# Patient Record
Sex: Male | Born: 1945 | Race: White | Hispanic: No | Marital: Married | Smoking: Never smoker
Health system: Southern US, Community
[De-identification: ages and names within clinical notes are randomized; demographics above are authoritative.]

## PROBLEM LIST (undated history)

## (undated) DIAGNOSIS — E039 Hypothyroidism, unspecified: Secondary | ICD-10-CM

## (undated) DIAGNOSIS — K760 Fatty (change of) liver, not elsewhere classified: Secondary | ICD-10-CM

## (undated) DIAGNOSIS — E782 Mixed hyperlipidemia: Secondary | ICD-10-CM

## (undated) DIAGNOSIS — K2270 Barrett's esophagus without dysplasia: Secondary | ICD-10-CM

## (undated) DIAGNOSIS — M1612 Unilateral primary osteoarthritis, left hip: Secondary | ICD-10-CM

## (undated) DIAGNOSIS — E038 Other specified hypothyroidism: Secondary | ICD-10-CM

## (undated) DIAGNOSIS — Z86010 Personal history of colonic polyps: Secondary | ICD-10-CM

## (undated) DIAGNOSIS — I10 Essential (primary) hypertension: Secondary | ICD-10-CM

## (undated) DIAGNOSIS — H269 Unspecified cataract: Secondary | ICD-10-CM

## (undated) DIAGNOSIS — R338 Other retention of urine: Secondary | ICD-10-CM

## (undated) DIAGNOSIS — D696 Thrombocytopenia, unspecified: Secondary | ICD-10-CM

## (undated) DIAGNOSIS — M47816 Spondylosis without myelopathy or radiculopathy, lumbar region: Secondary | ICD-10-CM

## (undated) DIAGNOSIS — K219 Gastro-esophageal reflux disease without esophagitis: Secondary | ICD-10-CM

## (undated) DIAGNOSIS — N182 Chronic kidney disease, stage 2 (mild): Secondary | ICD-10-CM

## (undated) HISTORY — DX: Unspecified cataract: H26.9

## (undated) HISTORY — PX: TUBAL LIGATION: SHX77

## (undated) HISTORY — DX: Other specified hypothyroidism: E03.8

## (undated) HISTORY — DX: Hypothyroidism, unspecified: E03.9

## (undated) HISTORY — DX: Thrombocytopenia, unspecified: D69.6

## (undated) HISTORY — DX: Unilateral primary osteoarthritis, left hip: M16.12

## (undated) HISTORY — DX: Fatty (change of) liver, not elsewhere classified: K76.0

## (undated) HISTORY — DX: Chronic kidney disease, stage 2 (mild): N18.2

## (undated) HISTORY — PX: ESOPHAGOGASTRODUODENOSCOPY: SHX1529

## (undated) HISTORY — DX: Spondylosis without myelopathy or radiculopathy, lumbar region: M47.816

## (undated) HISTORY — DX: Mixed hyperlipidemia: E78.2

## (undated) HISTORY — PX: APPENDECTOMY: SHX54

## (undated) HISTORY — DX: Barrett's esophagus without dysplasia: K22.70

## (undated) HISTORY — DX: Gastro-esophageal reflux disease without esophagitis: K21.9

## (undated) HISTORY — DX: Personal history of colonic polyps: Z86.010

---
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
## 1968-04-13 HISTORY — PX: APPENDECTOMY: SHX54

## 2007-09-12 DIAGNOSIS — Z86010 Personal history of colonic polyps: Secondary | ICD-10-CM

## 2007-09-12 DIAGNOSIS — Z860101 Personal history of adenomatous and serrated colon polyps: Secondary | ICD-10-CM

## 2007-09-12 HISTORY — DX: Personal history of adenomatous and serrated colon polyps: Z86.0101

## 2007-09-12 HISTORY — DX: Personal history of colonic polyps: Z86.010

## 2007-09-26 ENCOUNTER — Ambulatory Visit (HOSPITAL_COMMUNITY): Admission: RE | Admit: 2007-09-26 | Discharge: 2007-09-26 | Payer: Self-pay | Admitting: Gastroenterology

## 2007-09-26 ENCOUNTER — Encounter (INDEPENDENT_AMBULATORY_CARE_PROVIDER_SITE_OTHER): Payer: Self-pay | Admitting: Gastroenterology

## 2008-05-25 ENCOUNTER — Emergency Department (HOSPITAL_COMMUNITY): Admission: EM | Admit: 2008-05-25 | Discharge: 2008-05-25 | Payer: Self-pay | Admitting: Emergency Medicine

## 2010-08-26 NOTE — Op Note (Signed)
NAMEFERLANDO, Shawn [name]                 ACCOUNT NO.:  [id number]   MEDICAL RECORD NO.:  [id number]          PATIENT TYPE:  AMB   LOCATION:  ENDO                         FACILITY:  [facility]   PHYSICIAN:  Anselmo Rod, M.D.  DATE OF BIRTH:  [birth year]   DATE OF PROCEDURE:  09/26/2007  DATE OF DISCHARGE:                               OPERATIVE REPORT   PROCEDURE PERFORMED:  Colonoscopy with cold biopsies x 2.   ENDOSCOPIST:  Anselmo Rod.   INSTRUMENT USED:  Pentax video colonoscope.   INDICATIONS FOR PROCEDURE:  A 65 year old white male undergoing  screening colonoscopy to rule out colonic polyps, masses, etc.   PREPROCEDURE PREPARATION:  Informed consent was procured from the  patient.  The patient fasted for 4 hours prior to the procedure after  being prepped with 20 OsmoPrep the night of and 12 OsmoPrep pills the  morning of the procedure.  The risks and benefits of the procedure  including a 10% miss rate of cancer and polyp were discussed with the  patient as well.   PREPROCEDURE PHYSICAL:  The patient had stable vital signs.  NECK:  Supple.  CHEST:  Clear to auscultation.  S1/S2 regular.  ABDOMEN:  Soft with normal bowel sounds.   DESCRIPTION OF PROCEDURE:  The patient was placed in the left lateral  decubitus position and sedated with 75 mcg of Fentanyl and 8 mg of  Versed given intravenously in slow incremental doses. Once the patient  was adequately sedated and maintained on low-flow oxygen and continuous  cardiac monitoring, the Pentax video colonoscope was advanced from the  rectum to the cecum.  There was a large amount of residual stool in the  colon.  Multiple washes were done.  Small lesions could be missed.  Two  small sessile polyps were biopsied from the transverse colon.  Small  internal hemorrhoids were seen on retroflexion.  There was no evidence  of diverticulosis.  The appendiceal orifice and ileocecal valve were  clearly visualized and photographed.  The  patient tolerated the  procedure well without immediate complications.   IMPRESSION:  1. Two small sessile polyps biopsied from the transverse colon (cold      biopsies x2).  2. Small internal hemorrhoids.  3. No evidence of diverticulosis.  4. Large amount of residual stool in the colon.  Small lesions could      be missed.   RECOMMENDATIONS:  1. Await pathology results.  2. Avoid all nonsteroidals including aspirin for the next 2 weeks.  3. Repeat colonoscopy in the next 5 years.  If the patient has any      abnormal symptoms in interim, he is to contact the office      immediately for further recommendations.  4. Outpatient follow-up as need arises in the future.      Anselmo Rod, M.D.  Electronically Signed     JNM/MEDQ  D:  09/26/2007  T:  09/26/2007  Job:  [id number]   cc:   Avis Epley  Fax: [phone]

## 2010-09-30 ENCOUNTER — Emergency Department (HOSPITAL_COMMUNITY)
Admission: EM | Admit: 2010-09-30 | Discharge: 2010-09-30 | Disposition: A | Discharge status: Home / Self Care | Attending: Emergency Medicine | Admitting: Emergency Medicine

## 2010-09-30 DIAGNOSIS — IMO0001 Reserved for inherently not codable concepts without codable children: Secondary | ICD-10-CM | POA: Insufficient documentation

## 2010-09-30 DIAGNOSIS — S51809A Unspecified open wound of unspecified forearm, initial encounter: Secondary | ICD-10-CM | POA: Insufficient documentation

## 2010-09-30 DIAGNOSIS — I10 Essential (primary) hypertension: Secondary | ICD-10-CM | POA: Insufficient documentation

## 2010-10-01 ENCOUNTER — Emergency Department (HOSPITAL_COMMUNITY)
Admission: EM | Admit: 2010-10-01 | Discharge: 2010-10-01 | Disposition: A | Discharge status: Home / Self Care | Attending: Emergency Medicine | Admitting: Emergency Medicine

## 2010-10-01 DIAGNOSIS — Z09 Encounter for follow-up examination after completed treatment for conditions other than malignant neoplasm: Secondary | ICD-10-CM | POA: Insufficient documentation

## 2012-04-13 HISTORY — PX: COLONOSCOPY W/ POLYPECTOMY: SHX1380

## 2012-08-27 ENCOUNTER — Other Ambulatory Visit: Payer: Self-pay

## 2012-08-27 ENCOUNTER — Emergency Department (HOSPITAL_COMMUNITY): Payer: Medicare Other

## 2012-08-27 ENCOUNTER — Encounter (HOSPITAL_COMMUNITY): Payer: Self-pay | Admitting: Emergency Medicine

## 2012-08-27 ENCOUNTER — Emergency Department (HOSPITAL_COMMUNITY)
Admission: EM | Admit: 2012-08-27 | Discharge: 2012-08-27 | Disposition: A | Discharge status: Home / Self Care | Payer: Medicare Other | Attending: Emergency Medicine | Admitting: Emergency Medicine

## 2012-08-27 DIAGNOSIS — R55 Syncope and collapse: Secondary | ICD-10-CM | POA: Insufficient documentation

## 2012-08-27 DIAGNOSIS — R079 Chest pain, unspecified: Secondary | ICD-10-CM | POA: Insufficient documentation

## 2012-08-27 DIAGNOSIS — R42 Dizziness and giddiness: Secondary | ICD-10-CM | POA: Insufficient documentation

## 2012-08-27 DIAGNOSIS — Z79899 Other long term (current) drug therapy: Secondary | ICD-10-CM | POA: Insufficient documentation

## 2012-08-27 DIAGNOSIS — Z7982 Long term (current) use of aspirin: Secondary | ICD-10-CM | POA: Insufficient documentation

## 2012-08-27 DIAGNOSIS — I10 Essential (primary) hypertension: Secondary | ICD-10-CM | POA: Insufficient documentation

## 2012-08-27 HISTORY — DX: Essential (primary) hypertension: I10

## 2012-08-27 LAB — BASIC METABOLIC PANEL
CO2: 25 mEq/L (ref 19–32)
Calcium: 9.7 mg/dL (ref 8.4–10.5)
Chloride: 97 mEq/L (ref 96–112)
Creatinine, Ser: 0.97 mg/dL (ref 0.50–1.35)
Glucose, Bld: 105 mg/dL — ABNORMAL HIGH (ref 70–99)

## 2012-08-27 LAB — CBC
HCT: 43.1 % (ref 39.0–52.0)
Hemoglobin: 14.9 g/dL (ref 13.0–17.0)
MCH: 28.7 pg (ref 26.0–34.0)
MCV: 82.9 fL (ref 78.0–100.0)
RBC: 5.2 MIL/uL (ref 4.22–5.81)

## 2012-08-27 MED ORDER — METOCLOPRAMIDE HCL 5 MG/ML IJ SOLN
10.0000 mg | Freq: Once | INTRAMUSCULAR | Status: AC
  Administered 2012-08-27: 10 mg via INTRAVENOUS
  Filled 2012-08-27: qty 2

## 2012-08-27 MED ORDER — SODIUM CHLORIDE 0.9 % IV BOLUS (SEPSIS)
1000.0000 mL | Freq: Once | INTRAVENOUS | Status: AC
  Administered 2012-08-27: 1000 mL via INTRAVENOUS

## 2012-08-27 MED ORDER — MECLIZINE HCL 50 MG PO TABS: 25.0000 mg | ORAL_TABLET | Freq: Three times a day (TID) | ORAL | Status: DC | PRN

## 2012-08-27 NOTE — ED Provider Notes (Signed)
History     CSN: [id number]  Arrival date & time 08/27/12  1256   First MD Initiated Contact with Patient 08/27/12 1312      Chief Complaint  Patient presents with  . Dizziness  . Near Syncope    (Consider location/radiation/quality/duration/timing/severity/associated sxs/prior treatment) The history is provided by the patient.  Shawn [name] is a 67 y.o. male history hypertension here presenting with lightheadedness and dizziness and chest pain. Intermittent chest pain for a week. Chest pain is not exertional or pleuritic. A week ago he had an episode of lightheadedness and dizziness and felt nauseous. He also felt imbalanced at the time and felt like passing out and unsteady but denies any actual vertigo. Today he had a similar episode and came to the ER for evaluation.  Denies any shortness of breath or fevers or chills or cough. He has no cardiac history but he does have family history of CAD and hiatal hernia.   Past Medical History  Diagnosis Date  . Hypertension     History reviewed. No pertinent past surgical history.  History reviewed. No pertinent family history.  History  Substance Use Topics  . Smoking status: Never Smoker   . Smokeless tobacco: Not on file  . Alcohol Use: No      Review of Systems  Cardiovascular: Positive for chest pain.  Neurological: Positive for dizziness and light-headedness.  All other systems reviewed and are negative.    Allergies  Review of patient's allergies indicates no known allergies.  Home Medications   Current Outpatient Rx  Name  Route  Sig  Dispense  Refill  . acidophilus (RISAQUAD) CAPS   Oral   Take 1 capsule by mouth every morning.         Marland Kitchen aspirin EC 81 MG tablet   Oral   Take 81 mg by mouth every morning.         . calcium carbonate (TUMS - DOSED IN MG ELEMENTAL CALCIUM) 500 MG chewable tablet   Oral   Chew 1 tablet by mouth daily.         . fosinopril (MONOPRIL) 20 MG tablet   Oral   Take 20  mg by mouth every morning.         . hydrochlorothiazide (HYDRODIURIL) 25 MG tablet   Oral   Take 25 mg by mouth every morning.           BP 133/90  Pulse 77  Temp(Src) 97.5 F (36.4 C) (Oral)  Resp 11  SpO2 97%  Physical Exam  Nursing note and vitals reviewed. Constitutional: He is oriented to person, place, and time. He appears well-developed and well-nourished.  HENT:  Head: Normocephalic.  Mouth/Throat: Oropharynx is clear and moist.  Eyes: Conjunctivae are normal. Pupils are equal, round, and reactive to light.  No nystagmus   Neck: Normal range of motion. Neck supple.  Cardiovascular: Normal rate, regular rhythm and normal heart sounds.   Pulmonary/Chest: Effort normal and breath sounds normal. No respiratory distress. He has no wheezes. He has no rales.  Abdominal: Soft. Bowel sounds are normal. He exhibits no distension. There is no tenderness. There is no rebound and no guarding.  Musculoskeletal: Normal range of motion. He exhibits no edema and no tenderness.  Neurological: He is alert and oriented to person, place, and time. No cranial nerve deficit. Coordination normal.  Skin: Skin is warm and dry.  Psychiatric: He has a normal mood and affect. His behavior is normal. Judgment and  thought content normal.    ED Course  Procedures (including critical care time)  Labs Reviewed  BASIC METABOLIC PANEL - Abnormal; Notable for the following:    Sodium 131 (*)    Glucose, Bld 105 (*)    GFR calc non Af Amer 85 (*)    All other components within normal limits  CBC  POCT I-STAT TROPONIN I   Dg Chest 2 View  08/27/2012   *RADIOLOGY REPORT*  Clinical Data: Dizziness and near syncope.  Sudden onset of nausea.  CHEST - 2 VIEW  Comparison: None.  Findings: Normal heart, mediastinal, and hilar contours.  The trachea is midline.  The lungs are normally expanded and clear. Pulmonary vascularity is normal.  No acute bony abnormality.  IMPRESSION: No acute cardiopulmonary  disease.   Original Report Authenticated By: Britta Mccreedy, M.D.     No diagnosis found.   Date: 08/27/2012  Rate: 65  Rhythm: normal sinus rhythm  QRS Axis: normal  Intervals: normal  ST/T Wave abnormalities: nonspecific ST changes  Conduction Disutrbances:none  Narrative Interpretation: baseline wander  Old EKG Reviewed: none available    MDM  [name] is a 67 y.o. male here with lightheadedness, dizziness, and atypical chest pain. DDx includes vertigo, vs ACS vs orthostasis. Symptoms for a week and atypical chest pain so trop x 1 sufficient. He may have some vertigo but nl neuro exam and I am not concerned for central process so imaging not required. He is given reglan and felt better. He is not orthostatic. I think he is stable enough for outpatient workup if labs nl.   3:28 PM Labs unremarkable. Trop neg x 1. CXR unremarkable.         Richardean Canal, MD 08/27/12 [phone]

## 2012-08-27 NOTE — ED Notes (Addendum)
Pt states he began to have lightheadedness and dizziness 45 minutes ago.  Pt states he has had similar episodes since Sunday.  Family hx of MI and hiatal hernia.  Pt on aspirin at home.  Took 12 baby aspirin at home

## 2012-10-03 DIAGNOSIS — E039 Hypothyroidism, unspecified: Secondary | ICD-10-CM | POA: Insufficient documentation

## 2013-01-04 DIAGNOSIS — E782 Mixed hyperlipidemia: Secondary | ICD-10-CM | POA: Insufficient documentation

## 2013-01-04 DIAGNOSIS — K219 Gastro-esophageal reflux disease without esophagitis: Secondary | ICD-10-CM | POA: Insufficient documentation

## 2013-01-04 DIAGNOSIS — I10 Essential (primary) hypertension: Secondary | ICD-10-CM | POA: Insufficient documentation

## 2013-12-05 DIAGNOSIS — Z872 Personal history of diseases of the skin and subcutaneous tissue: Secondary | ICD-10-CM | POA: Insufficient documentation

## 2014-09-18 DIAGNOSIS — R05 Cough: Secondary | ICD-10-CM | POA: Diagnosis not present

## 2014-09-18 DIAGNOSIS — E782 Mixed hyperlipidemia: Secondary | ICD-10-CM | POA: Diagnosis not present

## 2014-09-18 DIAGNOSIS — I10 Essential (primary) hypertension: Secondary | ICD-10-CM | POA: Diagnosis not present

## 2014-09-18 DIAGNOSIS — E039 Hypothyroidism, unspecified: Secondary | ICD-10-CM | POA: Diagnosis not present

## 2014-09-18 DIAGNOSIS — J209 Acute bronchitis, unspecified: Secondary | ICD-10-CM | POA: Diagnosis not present

## 2014-10-02 DIAGNOSIS — K2270 Barrett's esophagus without dysplasia: Secondary | ICD-10-CM | POA: Diagnosis not present

## 2014-10-02 DIAGNOSIS — K219 Gastro-esophageal reflux disease without esophagitis: Secondary | ICD-10-CM | POA: Diagnosis not present

## 2014-10-09 ENCOUNTER — Other Ambulatory Visit: Payer: Self-pay | Admitting: Gastroenterology

## 2014-10-09 DIAGNOSIS — R110 Nausea: Secondary | ICD-10-CM

## 2014-10-23 ENCOUNTER — Ambulatory Visit (HOSPITAL_COMMUNITY)
Admission: RE | Admit: 2014-10-23 | Discharge: 2014-10-23 | Disposition: A | Discharge status: Home / Self Care | Payer: Medicare Other | Source: Ambulatory Visit | Attending: Gastroenterology | Admitting: Gastroenterology

## 2014-10-23 DIAGNOSIS — R110 Nausea: Secondary | ICD-10-CM | POA: Insufficient documentation

## 2014-10-23 MED ORDER — TECHNETIUM TC 99M SULFUR COLLOID
2.2000 | Freq: Once | INTRAVENOUS | Status: AC | PRN
  Administered 2014-10-23: 2.2 via ORAL

## 2014-10-26 DIAGNOSIS — Z23 Encounter for immunization: Secondary | ICD-10-CM | POA: Diagnosis not present

## 2014-10-26 DIAGNOSIS — E039 Hypothyroidism, unspecified: Secondary | ICD-10-CM | POA: Diagnosis not present

## 2014-10-26 DIAGNOSIS — Z0000 Encounter for general adult medical examination without abnormal findings: Secondary | ICD-10-CM | POA: Diagnosis not present

## 2014-10-26 DIAGNOSIS — I10 Essential (primary) hypertension: Secondary | ICD-10-CM | POA: Diagnosis not present

## 2014-10-26 DIAGNOSIS — Z125 Encounter for screening for malignant neoplasm of prostate: Secondary | ICD-10-CM | POA: Diagnosis not present

## 2014-10-26 DIAGNOSIS — K219 Gastro-esophageal reflux disease without esophagitis: Secondary | ICD-10-CM | POA: Diagnosis not present

## 2014-11-14 DIAGNOSIS — K440 Diaphragmatic hernia with obstruction, without gangrene: Secondary | ICD-10-CM | POA: Diagnosis not present

## 2014-11-14 DIAGNOSIS — K2270 Barrett's esophagus without dysplasia: Secondary | ICD-10-CM | POA: Diagnosis not present

## 2014-11-14 DIAGNOSIS — D131 Benign neoplasm of stomach: Secondary | ICD-10-CM | POA: Diagnosis not present

## 2014-11-14 DIAGNOSIS — K219 Gastro-esophageal reflux disease without esophagitis: Secondary | ICD-10-CM | POA: Diagnosis not present

## 2014-11-14 DIAGNOSIS — K317 Polyp of stomach and duodenum: Secondary | ICD-10-CM | POA: Diagnosis not present

## 2014-12-10 DIAGNOSIS — L570 Actinic keratosis: Secondary | ICD-10-CM | POA: Diagnosis not present

## 2015-01-29 DIAGNOSIS — E785 Hyperlipidemia, unspecified: Secondary | ICD-10-CM | POA: Diagnosis not present

## 2015-01-29 DIAGNOSIS — E782 Mixed hyperlipidemia: Secondary | ICD-10-CM | POA: Diagnosis not present

## 2015-01-29 DIAGNOSIS — E559 Vitamin D deficiency, unspecified: Secondary | ICD-10-CM | POA: Diagnosis not present

## 2015-04-14 DIAGNOSIS — N182 Chronic kidney disease, stage 2 (mild): Secondary | ICD-10-CM

## 2015-04-14 HISTORY — DX: Chronic kidney disease, stage 2 (mild): N18.2

## 2015-04-17 ENCOUNTER — Encounter: Payer: Self-pay | Admitting: Family Medicine

## 2015-04-17 ENCOUNTER — Ambulatory Visit (INDEPENDENT_AMBULATORY_CARE_PROVIDER_SITE_OTHER): Payer: Medicare Other | Admitting: Family Medicine

## 2015-04-17 VITALS — BP 122/87 | HR 67 | Temp 97.7°F | Resp 16 | Ht 70.0 in | Wt 214.5 lb

## 2015-04-17 DIAGNOSIS — Z125 Encounter for screening for malignant neoplasm of prostate: Secondary | ICD-10-CM | POA: Insufficient documentation

## 2015-04-17 DIAGNOSIS — I10 Essential (primary) hypertension: Secondary | ICD-10-CM | POA: Diagnosis not present

## 2015-04-17 DIAGNOSIS — K2270 Barrett's esophagus without dysplasia: Secondary | ICD-10-CM | POA: Diagnosis not present

## 2015-04-17 DIAGNOSIS — E781 Pure hyperglyceridemia: Secondary | ICD-10-CM

## 2015-04-17 DIAGNOSIS — K219 Gastro-esophageal reflux disease without esophagitis: Secondary | ICD-10-CM | POA: Diagnosis not present

## 2015-04-17 NOTE — Progress Notes (Signed)
Office Note 04/17/2015  CC:  Chief Complaint  Patient presents with  . Establish Care    Pt is not fasting.     HPI:  Shawn [name] is a 70 y.o. White male who is here to establish care. Patient's most recent primary MD: Dr. Olevia Bowens Old records in EPIC/[facility] EMR were reviewed prior to or during today's visit.  Last f/u with prior PCP was about 6 mo ago. Labs done 01/2015: pt brought with him today, reviewed today in office. Trigs down well into normal range, CMET, CBC, vit D and TSH all normal. Last PSA 10/2014 2.20.  No acute complaints. He is compliant with meds.  Past Medical History  Diagnosis Date  . Hypertension   . Hypertriglyceridemia     Pt intol of welchol; fenofibrate helpful  . History of colon polyps     Polyps at 1st screening colonoscopy; 2014 no polyps--recall 10 yrs  . GERD (gastroesophageal reflux disease)   . Barrett's esophagus     Dr. Collene Mares    Past Surgical History  Procedure Laterality Date  . Appendectomy    . Colonoscopy w/ polypectomy  2014  . Esophagogastroduodenoscopy  2014; 2016    Family History  Problem Relation Age of Onset  . Hypertension Mother   . Hyperlipidemia Mother   . Arthritis Father   . Diabetes Paternal Grandmother 65    Social History   Social History  . Marital Status: Married    Spouse Name: N/A  . Number of Children: N/A  . Years of Education: N/A   Occupational History  . Not on file.   Social History Main Topics  . Smoking status: Never Smoker   . Smokeless tobacco: Never Used  . Alcohol Use: No  . Drug Use: No  . Sexual Activity: Not on file   Other Topics Concern  . Not on file   Social History Narrative   Married, grand-daughter lives with him.   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   Orig from [place] and [place].   No T/A/Ds.   Exercises 3 X/week--walking 2-3 miles.    Outpatient Encounter Prescriptions as of 04/17/2015  Medication Sig  . aspirin EC 81 MG  tablet Take 81 mg by mouth every morning.  . fenofibrate 54 MG tablet Take 54 mg by mouth daily.  . fosinopril (MONOPRIL) 20 MG tablet Take 20 mg by mouth every morning.  . hydrochlorothiazide (HYDRODIURIL) 25 MG tablet Take 12.5 mg by mouth every morning.  Marland Kitchen omeprazole (PRILOSEC) 20 MG capsule Take 20 mg by mouth 2 (two) times daily.  . [DISCONTINUED] acidophilus (RISAQUAD) CAPS Take 1 capsule by mouth every morning. Reported on 04/17/2015  . [DISCONTINUED] calcium carbonate (TUMS - DOSED IN MG ELEMENTAL CALCIUM) 500 MG chewable tablet Chew 1 tablet by mouth daily. Reported on 04/17/2015  . [DISCONTINUED] meclizine (ANTIVERT) 50 MG tablet Take 0.5 tablets (25 mg total) by mouth 3 (three) times daily as needed. (Patient not taking: Reported on 04/17/2015)   No facility-administered encounter medications on file as of 04/17/2015.    No Known Allergies  ROS Review of Systems  Constitutional: Negative for fever and fatigue.  HENT: Negative for congestion and sore throat.   Eyes: Negative for visual disturbance.  Respiratory: Negative for cough.   Cardiovascular: Negative for chest pain.  Gastrointestinal: Negative for nausea and abdominal pain.  Genitourinary: Negative for dysuria.  Musculoskeletal: Negative for back pain and joint swelling.  Skin: Negative for rash.  Neurological: Negative for weakness and headaches.  Hematological: Negative for adenopathy.    PE; Blood pressure 122/87, pulse 67, temperature 97.7 F (36.5 C), temperature source Oral, resp. rate 16, height 5\' 10"  (1.778 m), weight 214 lb 8 oz (97.297 kg), SpO2 95 %. Gen: Alert, well appearing.  Patient is oriented to person, place, time, and situation. [id number]: no injection, icteris, swelling, or exudate.  EOMI, PERRLA. Mouth: lips without lesion/swelling.  Oral mucosa pink and moist. Oropharynx without erythema, exudate, or swelling.  Neck - No masses or thyromegaly or limitation in range of motion CV: RRR, no m/r/g.    LUNGS: CTA bilat, nonlabored resps, good aeration in all lung fields. ABD: soft, NT/ND EXT: no clubbing, cyanosis, or edema.   Pertinent labs:  None today   ASSESSMENT AND PLAN:   New pt; obtain prior PCP records and Dr. Lorie Apley records.  1) HTN; The current medical regimen is effective;  continue present plan and medications. Lytes/cr good 01/2015--scan labs into chart.  2) Hypertriglyceridemia: The current medical regimen is effective;  continue present plan and medications. Scan 01/2015 labs into EMR.  3) GERD with Barrett's esophagus: continue prilosec 20mg  bid as per Dr. Lorie Apley recommendations. Recent EGD 2016 showed stable Barrett's esoph per pt report. Get old records.  An After Visit Summary was printed and given to the patient.   Return in about 7 months (around 11/15/2015) for 30 min f/u chronic illness.

## 2015-04-17 NOTE — Progress Notes (Signed)
Pre visit review using our clinic review tool, if applicable. No additional management support is needed unless otherwise documented below in the visit note. 

## 2015-04-18 ENCOUNTER — Encounter: Payer: Self-pay | Admitting: Family Medicine

## 2015-04-18 ENCOUNTER — Other Ambulatory Visit: Payer: Self-pay | Admitting: *Deleted

## 2015-04-18 DIAGNOSIS — Z0000 Encounter for general adult medical examination without abnormal findings: Secondary | ICD-10-CM | POA: Insufficient documentation

## 2015-04-18 MED ORDER — HYDROCHLOROTHIAZIDE 25 MG PO TABS: 12.5000 mg | ORAL_TABLET | Freq: Every morning | ORAL | Status: DC

## 2015-04-18 MED ORDER — FENOFIBRATE 54 MG PO TABS: 54.0000 mg | ORAL_TABLET | Freq: Every day | ORAL | Status: DC

## 2015-04-18 MED ORDER — FOSINOPRIL SODIUM 20 MG PO TABS: 20.0000 mg | ORAL_TABLET | Freq: Every morning | ORAL | Status: DC

## 2015-04-18 NOTE — Telephone Encounter (Signed)
RF request for fosinopril LOV: 04/17/15 Next ov: 11/18/15 Last written: new pt  RF request for hctz Last written: new pt  RF request forfenofibrate Last written: new pt

## 2015-11-18 ENCOUNTER — Ambulatory Visit: Payer: Medicare Other | Admitting: Family Medicine

## 2015-11-21 ENCOUNTER — Other Ambulatory Visit (INDEPENDENT_AMBULATORY_CARE_PROVIDER_SITE_OTHER): Payer: Medicare Other

## 2015-11-21 DIAGNOSIS — E785 Hyperlipidemia, unspecified: Secondary | ICD-10-CM

## 2015-11-21 LAB — LIPID PANEL
CHOL/HDL RATIO: 4
Cholesterol: 194 mg/dL (ref 0–200)
HDL: 43.7 mg/dL (ref >39.00)
LDL CALC: 122 mg/dL — AB (ref 0–99)
NONHDL: 150.08
TRIGLYCERIDES: 142 mg/dL (ref 0.0–149.0)
VLDL: 28.4 mg/dL (ref 0.0–40.0)

## 2015-11-21 LAB — COMPREHENSIVE METABOLIC PANEL
ALT: 18 U/L (ref 0–53)
AST: 25 U/L (ref 0–37)
Albumin: 4.5 g/dL (ref 3.5–5.2)
Alkaline Phosphatase: 58 U/L (ref 39–117)
BILIRUBIN TOTAL: 0.5 mg/dL (ref 0.2–1.2)
BUN: 21 mg/dL (ref 6–23)
CALCIUM: 9.9 mg/dL (ref 8.4–10.5)
CHLORIDE: 103 meq/L (ref 96–112)
CO2: 29 meq/L (ref 19–32)
CREATININE: 1.18 mg/dL (ref 0.40–1.50)
GFR: 65.14 mL/min (ref >60.00)
GLUCOSE: 84 mg/dL (ref 70–99)
Potassium: 4.4 mEq/L (ref 3.5–5.1)
SODIUM: 137 meq/L (ref 135–145)
Total Protein: 7.4 g/dL (ref 6.0–8.3)

## 2015-11-22 ENCOUNTER — Other Ambulatory Visit: Payer: Self-pay | Admitting: Family Medicine

## 2015-11-22 ENCOUNTER — Other Ambulatory Visit (INDEPENDENT_AMBULATORY_CARE_PROVIDER_SITE_OTHER): Payer: Medicare Other

## 2015-11-22 DIAGNOSIS — E038 Other specified hypothyroidism: Secondary | ICD-10-CM

## 2015-11-22 DIAGNOSIS — E039 Hypothyroidism, unspecified: Secondary | ICD-10-CM

## 2015-11-22 DIAGNOSIS — E065 Other chronic thyroiditis: Secondary | ICD-10-CM

## 2015-11-22 LAB — TSH: TSH: 7.74 u[IU]/mL — ABNORMAL HIGH (ref 0.35–4.50)

## 2015-11-22 LAB — T3, FREE: T3, Free: 3.4 pg/mL (ref 2.3–4.2)

## 2015-11-22 LAB — T4, FREE: Free T4: 0.76 ng/dL (ref 0.60–1.60)

## 2015-11-28 ENCOUNTER — Ambulatory Visit (INDEPENDENT_AMBULATORY_CARE_PROVIDER_SITE_OTHER): Payer: Medicare Other | Admitting: Family Medicine

## 2015-11-28 ENCOUNTER — Encounter: Payer: Self-pay | Admitting: Family Medicine

## 2015-11-28 VITALS — BP 125/81 | HR 66 | Temp 97.8°F | Resp 16 | Ht 70.0 in | Wt 215.5 lb

## 2015-11-28 DIAGNOSIS — E782 Mixed hyperlipidemia: Secondary | ICD-10-CM

## 2015-11-28 DIAGNOSIS — K219 Gastro-esophageal reflux disease without esophagitis: Secondary | ICD-10-CM

## 2015-11-28 DIAGNOSIS — E039 Hypothyroidism, unspecified: Secondary | ICD-10-CM

## 2015-11-28 DIAGNOSIS — E038 Other specified hypothyroidism: Secondary | ICD-10-CM

## 2015-11-28 DIAGNOSIS — I10 Essential (primary) hypertension: Secondary | ICD-10-CM

## 2015-11-28 NOTE — Progress Notes (Signed)
OFFICE VISIT  11/28/2015   CC:  Chief Complaint  Patient presents with  . Follow-up    Pt is not fasting.      HPI:    Patient is a 70 y.o. Caucasian male who presents for 8 mo f/u HTN, GERD, hypertriglyceridemia, and subclinical hypothyroidism.  Feeling fine, compliant with meds. Exercise: lots of yard work.  Discussed recent labs, LDL up a little, statin recommended but pt declines.   Occ home bp monitoring normal. GERD well controlled on daily ppi.  Past Medical History:  Diagnosis Date  . Barrett's esophagus    Dr. Collene Mares  . GERD (gastroesophageal reflux disease)   . History of colon polyps 09/2007   Polyps at 1st screening colonoscopy; 2014 no polyps--recall 10 yrs  . Hyperlipemia, mixed    Pt intol of welchol; fenofibrate helpful  . Hypertension   . Subclinical hypothyroidism    per old PCP records  . Thrombocytopenia (HCC)    Hx of, mild/stable with monitoring per old PCP records    Past Surgical History:  Procedure Laterality Date  . APPENDECTOMY    . COLONOSCOPY W/ POLYPECTOMY  2014  . ESOPHAGOGASTRODUODENOSCOPY  2014; 2016    Outpatient Medications Prior to Visit  Medication Sig Dispense Refill  . aspirin EC 81 MG tablet Take 81 mg by mouth every morning.    . fenofibrate 54 MG tablet Take 1 tablet (54 mg total) by mouth daily. 90 tablet 3  . fosinopril (MONOPRIL) 20 MG tablet Take 1 tablet (20 mg total) by mouth every morning. 90 tablet 3  . hydrochlorothiazide (HYDRODIURIL) 25 MG tablet Take 0.5 tablets (12.5 mg total) by mouth every morning. 90 tablet 3  . omeprazole (PRILOSEC) 20 MG capsule Take 20 mg by mouth 2 (two) times daily.     No facility-administered medications prior to visit.     No Known Allergies  ROS As per HPI  PE: Blood pressure 125/81, pulse 66, temperature 97.8 F (36.6 C), temperature source Oral, resp. rate 16, height 5\' 10"  (1.778 m), weight 215 lb 8 oz (97.8 kg), SpO2 96 %. Gen: Alert, well appearing.  Patient is oriented  to person, place, time, and situation. CV: RRR, no m/r/g.   LUNGS: CTA bilat, nonlabored resps, good aeration in all lung fields. EXT: no clubbing, cyanosis, or edema.    LABS:  Lab Results  Component Value Date   TSH 7.74 (H) 11/22/2015  Free T4 and T3 total normal 11/22/15.  Lab Results  Component Value Date   WBC 6.7 08/27/2012   HGB 14.9 08/27/2012   HCT 43.1 08/27/2012   MCV 82.9 08/27/2012   PLT 170 08/27/2012   Lab Results  Component Value Date   CREATININE 1.18 11/21/2015   BUN 21 11/21/2015   NA 137 11/21/2015   K 4.4 11/21/2015   CL 103 11/21/2015   CO2 29 11/21/2015   Lab Results  Component Value Date   ALT 18 11/21/2015   AST 25 11/21/2015   ALKPHOS 58 11/21/2015   BILITOT 0.5 11/21/2015   Lab Results  Component Value Date   CHOL 194 11/21/2015   Lab Results  Component Value Date   HDL 43.70 11/21/2015   Lab Results  Component Value Date   LDLCALC 122 (H) 11/21/2015   Lab Results  Component Value Date   TRIG 142.0 11/21/2015   Lab Results  Component Value Date   CHOLHDL 4 11/21/2015    IMPRESSION AND PLAN:  1) HTN; The current medical  regimen is effective;  continue present plan and medications.  2) Hyperlipidemia, mixed: he wants to continue fenofibrate but he declines statin b/c he thinks these drugs' risk outweigh their benefit.  3) GERD: The current medical regimen is effective;  continue present plan and medications.  4) Subclinical hypothyroidism: labs stable.  No indication for meds at this time.    An After Visit Summary was printed and given to the patient.  FOLLOW UP: Return in about 6 months (around 05/30/2016) for annual CPE with fasting labs the week prior. He'll need FLP, CMET, TSH, free T4, T3 total, and medicare PSA.  Signed:  Crissie Sickles, MD           11/28/2015

## 2015-11-28 NOTE — Progress Notes (Signed)
Pre visit review using our clinic review tool, if applicable. No additional management support is needed unless otherwise documented below in the visit note. 

## 2015-12-17 DIAGNOSIS — D229 Melanocytic nevi, unspecified: Secondary | ICD-10-CM | POA: Diagnosis not present

## 2015-12-17 DIAGNOSIS — L570 Actinic keratosis: Secondary | ICD-10-CM | POA: Diagnosis not present

## 2015-12-17 DIAGNOSIS — Z872 Personal history of diseases of the skin and subcutaneous tissue: Secondary | ICD-10-CM | POA: Diagnosis not present

## 2016-02-16 DIAGNOSIS — J029 Acute pharyngitis, unspecified: Secondary | ICD-10-CM | POA: Diagnosis not present

## 2016-02-16 DIAGNOSIS — J0140 Acute pansinusitis, unspecified: Secondary | ICD-10-CM | POA: Diagnosis not present

## 2016-03-26 ENCOUNTER — Other Ambulatory Visit: Payer: Self-pay | Admitting: Family Medicine

## 2016-04-02 DIAGNOSIS — E669 Obesity, unspecified: Secondary | ICD-10-CM | POA: Diagnosis not present

## 2016-04-02 DIAGNOSIS — K2270 Barrett's esophagus without dysplasia: Secondary | ICD-10-CM | POA: Diagnosis not present

## 2016-04-02 DIAGNOSIS — K219 Gastro-esophageal reflux disease without esophagitis: Secondary | ICD-10-CM | POA: Diagnosis not present

## 2016-04-09 ENCOUNTER — Encounter: Payer: Self-pay | Admitting: Family Medicine

## 2016-04-12 ENCOUNTER — Other Ambulatory Visit: Payer: Self-pay | Admitting: Family Medicine

## 2016-05-17 ENCOUNTER — Other Ambulatory Visit: Payer: Self-pay | Admitting: Family Medicine

## 2016-05-17 DIAGNOSIS — E039 Hypothyroidism, unspecified: Secondary | ICD-10-CM

## 2016-05-17 DIAGNOSIS — Z125 Encounter for screening for malignant neoplasm of prostate: Secondary | ICD-10-CM

## 2016-05-17 DIAGNOSIS — E038 Other specified hypothyroidism: Secondary | ICD-10-CM

## 2016-05-17 DIAGNOSIS — E7800 Pure hypercholesterolemia, unspecified: Secondary | ICD-10-CM

## 2016-05-17 DIAGNOSIS — I10 Essential (primary) hypertension: Secondary | ICD-10-CM

## 2016-05-22 ENCOUNTER — Other Ambulatory Visit (INDEPENDENT_AMBULATORY_CARE_PROVIDER_SITE_OTHER): Payer: Medicare Other

## 2016-05-22 DIAGNOSIS — I10 Essential (primary) hypertension: Secondary | ICD-10-CM

## 2016-05-22 DIAGNOSIS — E039 Hypothyroidism, unspecified: Secondary | ICD-10-CM

## 2016-05-22 DIAGNOSIS — E7800 Pure hypercholesterolemia, unspecified: Secondary | ICD-10-CM

## 2016-05-22 DIAGNOSIS — Z125 Encounter for screening for malignant neoplasm of prostate: Secondary | ICD-10-CM | POA: Diagnosis not present

## 2016-05-22 DIAGNOSIS — E038 Other specified hypothyroidism: Secondary | ICD-10-CM

## 2016-05-22 LAB — COMPREHENSIVE METABOLIC PANEL
ALT: 22 U/L (ref 0–53)
AST: 26 U/L (ref 0–37)
Albumin: 4.4 g/dL (ref 3.5–5.2)
Alkaline Phosphatase: 56 U/L (ref 39–117)
BILIRUBIN TOTAL: 0.6 mg/dL (ref 0.2–1.2)
BUN: 20 mg/dL (ref 6–23)
CALCIUM: 9.6 mg/dL (ref 8.4–10.5)
CO2: 30 meq/L (ref 19–32)
CREATININE: 1.3 mg/dL (ref 0.40–1.50)
Chloride: 102 mEq/L (ref 96–112)
GFR: 58.17 mL/min — ABNORMAL LOW (ref >60.00)
GLUCOSE: 90 mg/dL (ref 70–99)
Potassium: 4.3 mEq/L (ref 3.5–5.1)
SODIUM: 137 meq/L (ref 135–145)
Total Protein: 7 g/dL (ref 6.0–8.3)

## 2016-05-22 LAB — LIPID PANEL
CHOL/HDL RATIO: 3
Cholesterol: 124 mg/dL (ref 0–200)
HDL: 39.5 mg/dL (ref >39.00)
LDL Cholesterol: 72 mg/dL (ref 0–99)
NONHDL: 84.16
TRIGLYCERIDES: 61 mg/dL (ref 0.0–149.0)
VLDL: 12.2 mg/dL (ref 0.0–40.0)

## 2016-05-22 LAB — TSH: TSH: 3.86 u[IU]/mL (ref 0.35–4.50)

## 2016-05-22 LAB — PSA, MEDICARE: PSA: 1.78 ng/ml (ref 0.10–4.00)

## 2016-05-22 LAB — T4, FREE: Free T4: 0.86 ng/dL (ref 0.60–1.60)

## 2016-05-23 LAB — T3: T3, Total: 98 ng/dL (ref 76–181)

## 2016-05-24 ENCOUNTER — Encounter: Payer: Self-pay | Admitting: Family Medicine

## 2016-05-28 NOTE — Progress Notes (Deleted)
Subjective:   Shawn [name] is a 71 y.o. male who presents for an Initial Medicare Annual Wellness Visit.  Review of Systems  No ROS.  Medicare Wellness Visit.    Sleep patterns:  Home Safety/Smoke Alarms:   Living environment; residence and Firearm Safety:  Seat Belt Safety/Bike Helmet: Wears seat belt.   Counseling:   Eye Exam-  Dental-  Male:   [facility]- Colonoscopy 03/06/2013. Recall 10 years. ??   PSA-05/22/2016, 1.78.      Objective:    There were no vitals filed for this visit. There is no height or weight on file to calculate BMI.  Current Medications (verified) Outpatient Encounter Prescriptions as of 05/29/2016  Medication Sig  . aspirin EC 81 MG tablet Take 81 mg by mouth every morning.  . fenofibrate 54 MG tablet TAKE 1 TABLET DAILY  . fosinopril (MONOPRIL) 20 MG tablet TAKE 1 TABLET EVERY MORNING  . hydrochlorothiazide (HYDRODIURIL) 25 MG tablet Take 0.5 tablets (12.5 mg total) by mouth every morning.  Marland Kitchen omeprazole (PRILOSEC) 20 MG capsule Take 20 mg by mouth 2 (two) times daily.   No facility-administered encounter medications on file as of 05/29/2016.     Allergies (verified) Patient has no known allergies.   History: Past Medical History:  Diagnosis Date  . Barrett's esophagus    Dr. Collene Mares  . Chronic renal insufficiency, stage 2 (mild) 2017   Stage II/III (GFR 60 ml/min)  . GERD (gastroesophageal reflux disease)   . History of colon polyps 09/2007   Polyps at 1st screening colonoscopy; 2014 no polyps--recall 10 yrs  . Hyperlipemia, mixed    Pt intol of welchol; fenofibrate helpful  . Hypertension   . Subclinical hypothyroidism    per old PCP records  . Thrombocytopenia (HCC)    Hx of, mild/stable with monitoring per old PCP records   Past Surgical History:  Procedure Laterality Date  . APPENDECTOMY    . COLONOSCOPY W/ POLYPECTOMY  2014   per Dr. Collene Mares, recall 02/2023  . ESOPHAGOGASTRODUODENOSCOPY  2014; 2016   Family History  Problem  Relation Age of Onset  . Hypertension Mother   . Hyperlipidemia Mother   . Arthritis Father   . Diabetes Paternal Grandmother 85   Social History   Occupational History  . Not on file.   Social History Main Topics  . Smoking status: Never Smoker  . Smokeless tobacco: Never Used  . Alcohol use No  . Drug use: No  . Sexual activity: Not on file   Tobacco Counseling Counseling given: Not Answered   Activities of Daily Living No flowsheet data found.  Immunizations and Health Maintenance Immunization History  Administered Date(s) Administered  . Pneumococcal Conjugate-13 10/26/2014  . Pneumococcal Polysaccharide-23 08/31/2012   Health Maintenance Due  Topic Date Due  . Hepatitis C Screening  07/05/1945  . ZOSTAVAX  05/07/2007  . INFLUENZA VACCINE  11/12/2015  . TETANUS/TDAP  05/17/2016    Patient Care Team: Tammi Sou, MD as PCP - General (Family Medicine) Juanita Craver, MD as Consulting Physician (Gastroenterology)  Indicate any recent Medical Services you may have received from other than [facility] providers in the past year (date may be approximate).    Assessment:   This is a routine wellness examination for [facility]. Physical assessment deferred to PCP.   Hearing/Vision screen No exam data present  Dietary issues and exercise activities discussed:   Diet (meal preparation, eat out, water intake, caffeinated beverages, dairy products, fruits and vegetables):  Breakfast: Lunch:  Dinner:      Goals    None     Depression Screen No flowsheet data found.  Fall Risk No flowsheet data found.  Cognitive Function:        Screening Tests Health Maintenance  Topic Date Due  . Hepatitis C Screening  1945/12/10  . ZOSTAVAX  05/07/2007  . INFLUENZA VACCINE  11/12/2015  . TETANUS/TDAP  05/17/2016  . COLONOSCOPY  03/07/2023  . PNA vac Low Risk Adult  Completed        Plan:     During the course of the visit Pearl was educated and counseled about  the following appropriate screening and preventive services:   Vaccines to include Pneumoccal, Influenza, Hepatitis B, Td, Zostavax, HCV  Colorectal cancer screening  Cardiovascular disease screening  Diabetes screening  Glaucoma screening  Nutrition counseling  Prostate cancer screening  Smoking cessation counseling  Patient Instructions (the written plan) were given to the patient.   Gerilyn Nestle, RN   05/28/2016

## 2016-05-28 NOTE — Progress Notes (Deleted)
Pre visit review using our clinic review tool, if applicable. No additional management support is needed unless otherwise documented below in the visit note. 

## 2016-05-29 ENCOUNTER — Ambulatory Visit: Payer: Medicare Other

## 2016-05-29 ENCOUNTER — Ambulatory Visit: Payer: Medicare Other | Admitting: Family Medicine

## 2016-06-04 ENCOUNTER — Encounter: Payer: Self-pay | Admitting: Family Medicine

## 2016-06-04 NOTE — Progress Notes (Signed)
Subjective:   Shawn [name] is a 71 y.o. male who presents for an Initial Medicare Annual Wellness Visit.  Review of Systems  No ROS.  Medicare Wellness Visit.  Cardiac Risk Factors include: advanced age (>2men, >40 women);family history of premature cardiovascular disease;male gender   Sleep patterns: Sleeps about 6 hours, naps.  Home Safety/Smoke Alarms: Smoke detectors and security in place.  Living environment; residence and Firearm Safety: Lives with wife and 34 yo granddaughter in 31 story home. Steps with rail. No firearms.  Seat Belt Safety/Bike Helmet: Wears seat belt.   Counseling:   Eye Exam-Last exam 04/2015, yearly. Reports cataracts right eye.  Dental-Last exam 02/2016, every 6 months.    Male:   CCS-03/06/2013, pt reports normal. Recall 10 years.    PSA-05/22/2016, 1.78.     Objective:    Today's Vitals   06/05/16 0821  BP: 132/72  Pulse: 65  SpO2: 96%  Weight: 212 lb (96.2 kg)  Height: 5\' 10"  (1.778 m)   Body mass index is 30.42 kg/m.  Current Medications (verified) Outpatient Encounter Prescriptions as of 06/05/2016  Medication Sig  . aspirin EC 81 MG tablet Take 81 mg by mouth every morning.  . fenofibrate 54 MG tablet TAKE 1 TABLET DAILY  . fosinopril (MONOPRIL) 20 MG tablet TAKE 1 TABLET EVERY MORNING  . hydrochlorothiazide (HYDRODIURIL) 25 MG tablet Take 0.5 tablets (12.5 mg total) by mouth every morning.  Marland Kitchen omeprazole (PRILOSEC) 20 MG capsule Take 20 mg by mouth 2 (two) times daily.   No facility-administered encounter medications on file as of 06/05/2016.     Allergies (verified) Patient has no known allergies.   History: Past Medical History:  Diagnosis Date  . Barrett's esophagus    Dr. Collene Mares  . Chronic renal insufficiency, stage 2 (mild) 2017   Stage II/III (GFR 60 ml/min)  . GERD (gastroesophageal reflux disease)   . History of colon polyps 09/2007   Polyps at 1st screening colonoscopy; 2014 no polyps--recall 10 yrs  .  Hyperlipemia, mixed    Pt intol of welchol; fenofibrate helpful  . Hypertension   . Subclinical hypothyroidism    per old PCP records  . Thrombocytopenia (HCC)    Hx of, mild/stable with monitoring per old PCP records   Past Surgical History:  Procedure Laterality Date  . APPENDECTOMY    . COLONOSCOPY W/ POLYPECTOMY  2014   per Dr. Collene Mares, recall 02/2023  . ESOPHAGOGASTRODUODENOSCOPY  2014; 2016   Family History  Problem Relation Age of Onset  . Hypertension Mother   . Hyperlipidemia Mother   . Arthritis Father   . Diabetes Paternal Grandmother 56   Social History   Occupational History  . Not on file.   Social History Main Topics  . Smoking status: Never Smoker  . Smokeless tobacco: Never Used  . Alcohol use No  . Drug use: No  . Sexual activity: Not on file   Tobacco Counseling Counseling given: Not Answered   Activities of Daily Living In your present state of health, do you have any difficulty performing the following activities: 06/05/2016  Hearing? N  Vision? N  Difficulty concentrating or making decisions? N  Walking or climbing stairs? N  Dressing or bathing? N  Doing errands, shopping? N  Preparing Food and eating ? N  Using the Toilet? N  In the past six months, have you accidently leaked urine? N  Do you have problems with loss of bowel control? N  Managing your Medications?  N  Managing your Finances? N  Housekeeping or managing your Housekeeping? N  Some recent data might be hidden    Immunizations and Health Maintenance Immunization History  Administered Date(s) Administered  . Influenza-Unspecified 01/12/2016  . Pneumococcal Conjugate-13 10/26/2014  . Pneumococcal Polysaccharide-23 08/31/2012   Health Maintenance Due  Topic Date Due  . Samul Dada  05/17/2016    Patient Care Team: Tammi Sou, MD as PCP - General (Family Medicine) Juanita Craver, MD as Consulting Physician (Gastroenterology)  Indicate any recent Medical Services  you may have received from other than [facility] providers in the past year (date may be approximate).    Assessment:   This is a routine wellness examination for [facility]. Physical assessment deferred to PCP.   Hearing/Vision screen Hearing Screening Comments: Able to hear conversational tones w/o difficulty. No issues reported.   Vision Screening Comments: Wears glasses.   Dietary issues and exercise activities discussed: Current Exercise Habits: Home exercise routine (yard work, floors, walks), Type of exercise: walking, Time (Minutes): 20, Frequency (Times/Week): 7, Weekly Exercise (Minutes/Week): 140, Intensity: Mild, Exercise limited by: None identified   Diet (meal preparation, eat out, water intake, caffeinated beverages, dairy products, fruits and vegetables): Drinks coffee, diet coke.   Breakfast: yogurt, eggs, vegetable, coffee (1/2 caff, 4 pots/day) Lunch: sandwich, snack Dinner: protein, starch, vegetables, pasta     Discussed heart healthy diet and maintaining activity level.   Goals      Patient Stated   . <enter goal here> (pt-stated)          Maintain current health status by remaining active.       Depression Screen PHQ 2/9 Scores 06/05/2016  PHQ - 2 Score 0    Fall Risk Fall Risk  06/05/2016  Falls in the past year? No    Cognitive Function:       Ad8 score reviewed for issues:  Issues making decisions: no  Less interest in hobbies / activities: no  Repeats questions, stories (family complaining): no  Trouble using ordinary gadgets (microwave, computer, phone): no  Forgets the month or year: no  Mismanaging finances: no  Remembering appts: no  Daily problems with thinking and/or memory:no Ad8 score is=0     Screening Tests Health Maintenance  Topic Date Due  . TETANUS/TDAP  05/17/2016  . Hepatitis C Screening  06/05/2017 (Originally Sep 06, 1945)  . COLONOSCOPY  03/07/2023  . INFLUENZA VACCINE  Completed  . PNA vac Low Risk Adult   Completed        Plan:     Continue to eat heart healthy diet (full of fruits, vegetables, whole grains, lean protein, water--limit salt, fat, and sugar intake) and increase physical activity as tolerated.  During the course of the visit Jered was educated and counseled about the following appropriate screening and preventive services:   Vaccines to include Pneumoccal, Influenza, Hepatitis B, Td, Zostavax, HCV  Colorectal cancer screening  Cardiovascular disease screening  Diabetes screening  Glaucoma screening  Nutrition counseling  Prostate cancer screening  Patient Instructions (the written plan) were given to the patient.   Gerilyn Nestle, RN   06/05/2016

## 2016-06-04 NOTE — Progress Notes (Signed)
Pre visit review using our clinic review tool, if applicable. No additional management support is needed unless otherwise documented below in the visit note. 

## 2016-06-05 ENCOUNTER — Ambulatory Visit (INDEPENDENT_AMBULATORY_CARE_PROVIDER_SITE_OTHER): Payer: Medicare Other

## 2016-06-05 VITALS — BP 132/72 | HR 65 | Ht 70.0 in | Wt 212.0 lb

## 2016-06-05 DIAGNOSIS — Z0000 Encounter for general adult medical examination without abnormal findings: Secondary | ICD-10-CM | POA: Diagnosis not present

## 2016-06-05 NOTE — Patient Instructions (Addendum)
Eat heart healthy diet (full of fruits, vegetables, whole grains, lean protein, water--limit salt, fat, and sugar intake) and increase physical activity as tolerated.   Fall Prevention in the Home Introduction Falls can cause injuries. They can happen to people of all ages. There are many things you can do to make your home safe and to help prevent falls. What can I do on the outside of my home?  Regularly fix the edges of walkways and driveways and fix any cracks.  Remove anything that might make you trip as you walk through a door, such as a raised step or threshold.  Trim any bushes or trees on the path to your home.  Use bright outdoor lighting.  Clear any walking paths of anything that might make someone trip, such as rocks or tools.  Regularly check to see if handrails are loose or broken. Make sure that both sides of any steps have handrails.  Any raised decks and porches should have guardrails on the edges.  Have any leaves, snow, or ice cleared regularly.  Use sand or salt on walking paths during winter.  Clean up any spills in your garage right away. This includes oil or grease spills. What can I do in the bathroom?  Use night lights.  Install grab bars by the toilet and in the tub and shower. Do not use towel bars as grab bars.  Use non-skid mats or decals in the tub or shower.  If you need to sit down in the shower, use a plastic, non-slip stool.  Keep the floor dry. Clean up any water that spills on the floor as soon as it happens.  Remove soap buildup in the tub or shower regularly.  Attach bath mats securely with double-sided non-slip rug tape.  Do not have throw rugs and other things on the floor that can make you trip. What can I do in the bedroom?  Use night lights.  Make sure that you have a light by your bed that is easy to reach.  Do not use any sheets or blankets that are too big for your bed. They should not hang down onto the floor.  Have a  firm chair that has side arms. You can use this for support while you get dressed.  Do not have throw rugs and other things on the floor that can make you trip. What can I do in the kitchen?  Clean up any spills right away.  Avoid walking on wet floors.  Keep items that you use a lot in easy-to-reach places.  If you need to reach something above you, use a strong step stool that has a grab bar.  Keep electrical cords out of the way.  Do not use floor polish or wax that makes floors slippery. If you must use wax, use non-skid floor wax.  Do not have throw rugs and other things on the floor that can make you trip. What can I do with my stairs?  Do not leave any items on the stairs.  Make sure that there are handrails on both sides of the stairs and use them. Fix handrails that are broken or loose. Make sure that handrails are as long as the stairways.  Check any carpeting to make sure that it is firmly attached to the stairs. Fix any carpet that is loose or worn.  Avoid having throw rugs at the top or bottom of the stairs. If you do have throw rugs, attach them to the floor  with carpet tape.  Make sure that you have a light switch at the top of the stairs and the bottom of the stairs. If you do not have them, ask someone to add them for you. What else can I do to help prevent falls?  Wear shoes that:  Do not have high heels.  Have rubber bottoms.  Are comfortable and fit you well.  Are closed at the toe. Do not wear sandals.  If you use a stepladder:  Make sure that it is fully opened. Do not climb a closed stepladder.  Make sure that both sides of the stepladder are locked into place.  Ask someone to hold it for you, if possible.  Clearly mark and make sure that you can see:  Any grab bars or handrails.  First and last steps.  Where the edge of each step is.  Use tools that help you move around (mobility aids) if they are needed. These  include:  Canes.  Walkers.  Scooters.  Crutches.  Turn on the lights when you go into a dark area. Replace any light bulbs as soon as they burn out.  Set up your furniture so you have a clear path. Avoid moving your furniture around.  If any of your floors are uneven, fix them.  If there are any pets around you, be aware of where they are.  Review your medicines with your doctor. Some medicines can make you feel dizzy. This can increase your chance of falling. Ask your doctor what other things that you can do to help prevent falls. This information is not intended to replace advice given to you by your health care provider. Make sure you discuss any questions you have with your health care provider. Document Released: 01/24/2009 Document Revised: 09/05/2015 Document Reviewed: 05/04/2014  2017 Elsevier  Health Maintenance, Male A healthy lifestyle and preventative care can promote health and wellness.  Maintain regular health, dental, and eye exams.  Eat a healthy diet. Foods like vegetables, fruits, whole grains, low-fat dairy products, and lean protein foods contain the nutrients you need and are low in calories. Decrease your intake of foods high in solid fats, added sugars, and salt. Get information about a proper diet from your health care provider, if necessary.  Regular physical exercise is one of the most important things you can do for your health. Most adults should get at least 150 minutes of moderate-intensity exercise (any activity that increases your heart rate and causes you to sweat) each week. In addition, most adults need muscle-strengthening exercises on 2 or more days a week.   Maintain a healthy weight. The body mass index (BMI) is a screening tool to identify possible weight problems. It provides an estimate of body fat based on height and weight. Your health care provider can find your BMI and can help you achieve or maintain a healthy weight. For males 20  years and older:  A BMI below 18.5 is considered underweight.  A BMI of 18.5 to 24.9 is normal.  A BMI of 25 to 29.9 is considered overweight.  A BMI of 30 and above is considered obese.  Maintain normal blood lipids and cholesterol by exercising and minimizing your intake of saturated fat. Eat a balanced diet with plenty of fruits and vegetables. Blood tests for lipids and cholesterol should begin at age 8 and be repeated every 5 years. If your lipid or cholesterol levels are high, you are over age 46, or you are at high  risk for heart disease, you may need your cholesterol levels checked more frequently.Ongoing high lipid and cholesterol levels should be treated with medicines if diet and exercise are not working.  If you smoke, find out from your health care provider how to quit. If you do not use tobacco, do not start.  Lung cancer screening is recommended for adults aged 62-80 years who are at high risk for developing lung cancer because of a history of smoking. A yearly low-dose CT scan of the lungs is recommended for people who have at least a 30-pack-year history of smoking and are current smokers or have quit within the past 15 years. A pack year of smoking is smoking an average of 1 pack of cigarettes a day for 1 year (for example, a 30-pack-year history of smoking could mean smoking 1 pack a day for 30 years or 2 packs a day for 15 years). Yearly screening should continue until the smoker has stopped smoking for at least 15 years. Yearly screening should be stopped for people who develop a health problem that would prevent them from having lung cancer treatment.  If you choose to drink alcohol, do not have more than 2 drinks per day. One drink is considered to be 12 oz (360 mL) of beer, 5 oz (150 mL) of wine, or 1.5 oz (45 mL) of liquor.  Avoid the use of street drugs. Do not share needles with anyone. Ask for help if you need support or instructions about stopping the use of  drugs.  High blood pressure causes heart disease and increases the risk of stroke. High blood pressure is more likely to develop in:  People who have blood pressure in the end of the normal range (100-139/85-89 mm Hg).  People who are overweight or obese.  People who are African American.  If you are 13-71 years of age, have your blood pressure checked every 3-5 years. If you are 68 years of age or older, have your blood pressure checked every year. You should have your blood pressure measured twice-once when you are at a hospital or clinic, and once when you are not at a hospital or clinic. Record the average of the two measurements. To check your blood pressure when you are not at a hospital or clinic, you can use:  An automated blood pressure machine at a pharmacy.  A home blood pressure monitor.  If you are 25-67 years old, ask your health care provider if you should take aspirin to prevent heart disease.  Diabetes screening involves taking a blood sample to check your fasting blood sugar level. This should be done once every 3 years after age 21 if you are at a normal weight and without risk factors for diabetes. Testing should be considered at a younger age or be carried out more frequently if you are overweight and have at least 1 risk factor for diabetes.  Colorectal cancer can be detected and often prevented. Most routine colorectal cancer screening begins at the age of 72 and continues through age 59. However, your health care provider may recommend screening at an earlier age if you have risk factors for colon cancer. On a yearly basis, your health care provider may provide home test kits to check for hidden blood in the stool. A small camera at the end of a tube may be used to directly examine the colon (sigmoidoscopy or colonoscopy) to detect the earliest forms of colorectal cancer. Talk to your health care provider about this  at age 36 when routine screening begins. A direct exam of  the colon should be repeated every 5-10 years through age 35, unless early forms of precancerous polyps or small growths are found.  People who are at an increased risk for hepatitis B should be screened for this virus. You are considered at high risk for hepatitis B if:  You were born in a country where hepatitis B occurs often. Talk with your health care provider about which countries are considered high risk.  Your parents were born in a high-risk country and you have not received a shot to protect against hepatitis B (hepatitis B vaccine).  You have HIV or AIDS.  You use needles to inject street drugs.  You live with, or have sex with, someone who has hepatitis B.  You are a man who has sex with other men (MSM).  You get hemodialysis treatment.  You take certain medicines for conditions like cancer, organ transplantation, and autoimmune conditions.  Hepatitis C blood testing is recommended for all people born from [birth year] through 1963 and any individual with known risk factors for hepatitis C.  Healthy men should no longer receive prostate-specific antigen (PSA) blood tests as part of routine cancer screening. Talk to your health care provider about prostate cancer screening.  Testicular cancer screening is not recommended for adolescents or adult males who have no symptoms. Screening includes self-exam, a health care provider exam, and other screening tests. Consult with your health care provider about any symptoms you have or any concerns you have about testicular cancer.  Practice safe sex. Use condoms and avoid high-risk sexual practices to reduce the spread of sexually transmitted infections (STIs).  You should be screened for STIs, including gonorrhea and chlamydia if:  You are sexually active and are younger than 24 years.  You are older than 24 years, and your health care provider tells you that you are at risk for this type of infection.  Your sexual activity has changed  since you were last screened, and you are at an increased risk for chlamydia or gonorrhea. Ask your health care provider if you are at risk.  If you are at risk of being infected with HIV, it is recommended that you take a prescription medicine daily to prevent HIV infection. This is called pre-exposure prophylaxis (PrEP). You are considered at risk if:  You are a man who has sex with other men (MSM).  You are a heterosexual man who is sexually active with multiple partners.  You take drugs by injection.  You are sexually active with a partner who has HIV.  Talk with your health care provider about whether you are at high risk of being infected with HIV. If you choose to begin PrEP, you should first be tested for HIV. You should then be tested every 3 months for as long as you are taking PrEP.  Use sunscreen. Apply sunscreen liberally and repeatedly throughout the day. You should seek shade when your shadow is shorter than you. Protect yourself by wearing long sleeves, pants, a wide-brimmed hat, and sunglasses year round whenever you are outdoors.  Tell your health care provider of new moles or changes in moles, especially if there is a change in shape or color. Also, tell your health care provider if a mole is larger than the size of a pencil eraser.  A one-time screening for abdominal aortic aneurysm (AAA) and surgical repair of large AAAs by ultrasound is recommended for men aged 4-75 years  who are current or former smokers.  Stay current with your vaccines (immunizations). This information is not intended to replace advice given to you by your health care provider. Make sure you discuss any questions you have with your health care provider. Document Released: 09/26/2007 Document Revised: 04/20/2014 Document Reviewed: 01/01/2015 Elsevier Interactive Patient Education  2017 [employer].

## 2016-06-08 NOTE — Progress Notes (Signed)
AWV reviewed and agree.  Signed:  Crissie Sickles, MD           06/08/2016

## 2016-06-24 ENCOUNTER — Other Ambulatory Visit: Payer: Self-pay | Admitting: Family Medicine

## 2016-06-26 ENCOUNTER — Other Ambulatory Visit: Payer: Self-pay | Admitting: Family Medicine

## 2016-06-26 NOTE — Telephone Encounter (Signed)
[facility].  RF request for hctz LOV: 11/28/15 Next ov:  06/07/17 Last written: 04/18/15 #90 w/ 3RF  Will send Rx for #90 w/ 0RF. Pt need office visit for f/u [facility] for more refills. Left detailed message on cell, okay per DPR.

## 2016-07-08 ENCOUNTER — Encounter: Payer: Self-pay | Admitting: Family Medicine

## 2016-07-08 ENCOUNTER — Ambulatory Visit (INDEPENDENT_AMBULATORY_CARE_PROVIDER_SITE_OTHER): Payer: Medicare Other | Admitting: Family Medicine

## 2016-07-08 VITALS — BP 137/87 | HR 65 | Temp 97.7°F | Resp 16 | Ht 70.0 in | Wt 217.2 lb

## 2016-07-08 DIAGNOSIS — I10 Essential (primary) hypertension: Secondary | ICD-10-CM

## 2016-07-08 DIAGNOSIS — M79605 Pain in left leg: Secondary | ICD-10-CM

## 2016-07-08 DIAGNOSIS — M25552 Pain in left hip: Secondary | ICD-10-CM | POA: Diagnosis not present

## 2016-07-08 DIAGNOSIS — E781 Pure hyperglyceridemia: Secondary | ICD-10-CM | POA: Diagnosis not present

## 2016-07-08 MED ORDER — FENOFIBRATE 54 MG PO TABS: 54.0000 mg | ORAL_TABLET | Freq: Every day | ORAL | 3 refills | Status: DC

## 2016-07-08 MED ORDER — FOSINOPRIL SODIUM 20 MG PO TABS: 20.0000 mg | ORAL_TABLET | Freq: Every morning | ORAL | 3 refills | Status: DC

## 2016-07-08 MED ORDER — HYDROCHLOROTHIAZIDE 25 MG PO TABS: ORAL_TABLET | ORAL | 3 refills | Status: DC

## 2016-07-08 NOTE — Progress Notes (Signed)
OFFICE VISIT  07/08/2016   CC:  Chief Complaint  Patient presents with  . Follow-up    RCI, pt is not fasting.    HPI:    Patient is a 71 y.o. Caucasian male who presents for discussion of medications. He takes fenofibrate, fosinopril, and HCTZ.  HTN: no home bp monitoring.  Compliant with meds.  Hypertrig: tolerating med, most recent labs were normal.  Says left leg "deteriorating" over last 2 yrs.  With walking 2 miles a day, he gets some vague left foot tingling towards end of the walk.  Has some chronic/recurrent pain in left hip region anteriorly and laterally with activity.  Occ calf cramp but no claudication.  His symptoms seem to not occur consistently, are mild, and seem to work themselves out within about 2 hours.   Past Medical History:  Diagnosis Date  . Barrett's esophagus    Dr. Collene Mares  . Chronic renal insufficiency, stage 2 (mild) 2017   Stage II/III (GFR 60 ml/min)  . GERD (gastroesophageal reflux disease)   . History of colon polyps 09/2007   Polyps at 1st screening colonoscopy; 2014 no polyps--recall 10 yrs  . Hyperlipemia, mixed    Pt intol of welchol; fenofibrate helpful  . Hypertension   . Subclinical hypothyroidism    per old PCP records  . Thrombocytopenia (HCC)    Hx of, mild/stable with monitoring per old PCP records    Past Surgical History:  Procedure Laterality Date  . APPENDECTOMY    . COLONOSCOPY W/ POLYPECTOMY  2014   per Dr. Collene Mares, recall 02/2023  . ESOPHAGOGASTRODUODENOSCOPY  2014; 2016    Outpatient Medications Prior to Visit  Medication Sig Dispense Refill  . aspirin EC 81 MG tablet Take 81 mg by mouth every morning.    . fenofibrate 54 MG tablet TAKE 1 TABLET DAILY 90 tablet 0  . fosinopril (MONOPRIL) 20 MG tablet TAKE 1 TABLET EVERY MORNING 90 tablet 0  . hydrochlorothiazide (HYDRODIURIL) 25 MG tablet TAKE ONE-HALF (1/2) TABLET EVERY MORNING 90 tablet 0  . omeprazole (PRILOSEC) 20 MG capsule Take 20 mg by mouth 2 (two) times  daily.     No facility-administered medications prior to visit.     No Known Allergies  ROS As per HPI  PE: Blood pressure 137/87, pulse 65, temperature 97.7 F (36.5 C), temperature source Oral, resp. rate 16, height 5\' 10"  (1.778 m), weight 217 lb 4 oz (98.5 kg), SpO2 94 %. Gen: Alert, well appearing.  Patient is oriented to person, place, time, and situation. AFFECT: pleasant, lucid thought and speech. CV: RRR, no m/r/g.   LUNGS: CTA bilat, nonlabored resps, good aeration in all lung fields. EXT: no clubbing, cyanosis, or edema.  Left hip: no tenderness anywhere on hip or in L groin.  ROM of L hip intact w/out pain. Femoral, PT, and DP pulses all 1+.    LABS:  Lab Results  Component Value Date   TSH 3.86 05/22/2016   Lab Results  Component Value Date   WBC 6.7 08/27/2012   HGB 14.9 08/27/2012   HCT 43.1 08/27/2012   MCV 82.9 08/27/2012   PLT 170 08/27/2012   Lab Results  Component Value Date   CREATININE 1.30 05/22/2016   BUN 20 05/22/2016   NA 137 05/22/2016   K 4.3 05/22/2016   CL 102 05/22/2016   CO2 30 05/22/2016  GFR 05/22/16= 58 ml/min  Lab Results  Component Value Date   ALT 22 05/22/2016   AST 26  05/22/2016   ALKPHOS 56 05/22/2016   BILITOT 0.6 05/22/2016   Lab Results  Component Value Date   CHOL 124 05/22/2016   Lab Results  Component Value Date   HDL 39.50 05/22/2016   Lab Results  Component Value Date   LDLCALC 72 05/22/2016   Lab Results  Component Value Date   TRIG 61.0 05/22/2016   Lab Results  Component Value Date   CHOLHDL 3 05/22/2016   Lab Results  Component Value Date   PSA 1.78 05/22/2016    IMPRESSION AND PLAN:  1) HTN; The current medical regimen is effective;  continue present plan and medications. Recent lytes/cr stable.  2) Hypertriglyceridemia: tolerating med, recent lipid panel 05/2016 great.  3) Left hip pain: muscular in nature.  No red flags for osteoarthritis, neuropathic pain, or PAD. Reassured pt at  this time.  Watchful waiting.  Wants new rx's for all meds to try to synchronize them with pharmac--done today.  An After Visit Summary was printed and given to the patient.  Spent 25 min with pt today, with >50% of this time spent in counseling and care coordination regarding the above problems.   FOLLOW UP: Return in about 6 months (around 01/08/2017) for routine chronic illness f/u.  Signed:  Crissie Sickles, MD           07/08/2016

## 2016-07-08 NOTE — Progress Notes (Signed)
Pre visit review using our clinic review tool, if applicable. No additional management support is needed unless otherwise documented below in the visit note. 

## 2016-11-05 DIAGNOSIS — K219 Gastro-esophageal reflux disease without esophagitis: Secondary | ICD-10-CM | POA: Diagnosis not present

## 2016-11-05 DIAGNOSIS — K2270 Barrett's esophagus without dysplasia: Secondary | ICD-10-CM | POA: Diagnosis not present

## 2016-11-05 DIAGNOSIS — E669 Obesity, unspecified: Secondary | ICD-10-CM | POA: Diagnosis not present

## 2016-11-16 ENCOUNTER — Encounter: Payer: Self-pay | Admitting: Family Medicine

## 2016-11-16 DIAGNOSIS — K219 Gastro-esophageal reflux disease without esophagitis: Secondary | ICD-10-CM | POA: Diagnosis not present

## 2016-11-16 DIAGNOSIS — K2270 Barrett's esophagus without dysplasia: Secondary | ICD-10-CM | POA: Diagnosis not present

## 2016-11-16 DIAGNOSIS — K449 Diaphragmatic hernia without obstruction or gangrene: Secondary | ICD-10-CM | POA: Diagnosis not present

## 2016-11-24 ENCOUNTER — Encounter: Payer: Self-pay | Admitting: Family Medicine

## 2016-12-24 DIAGNOSIS — L821 Other seborrheic keratosis: Secondary | ICD-10-CM | POA: Diagnosis not present

## 2016-12-24 DIAGNOSIS — L570 Actinic keratosis: Secondary | ICD-10-CM | POA: Diagnosis not present

## 2016-12-24 DIAGNOSIS — Z872 Personal history of diseases of the skin and subcutaneous tissue: Secondary | ICD-10-CM | POA: Diagnosis not present

## 2016-12-24 DIAGNOSIS — D229 Melanocytic nevi, unspecified: Secondary | ICD-10-CM | POA: Diagnosis not present

## 2017-01-04 ENCOUNTER — Telehealth: Payer: Self-pay | Admitting: Family Medicine

## 2017-01-04 NOTE — Telephone Encounter (Signed)
Pt advised that he need to keep his apt on 01/08/17.

## 2017-01-04 NOTE — Telephone Encounter (Signed)
Patient is doing well on his medications that he has been taking for years. He would like to know if he should move his 01/08/17 to a later date. He has several refills left. Please call him back.

## 2017-01-08 ENCOUNTER — Encounter: Payer: Self-pay | Admitting: Family Medicine

## 2017-01-08 ENCOUNTER — Ambulatory Visit (INDEPENDENT_AMBULATORY_CARE_PROVIDER_SITE_OTHER): Payer: Medicare Other | Admitting: Family Medicine

## 2017-01-08 VITALS — BP 132/83 | HR 67 | Temp 97.6°F | Resp 16 | Ht 70.0 in | Wt 215.8 lb

## 2017-01-08 DIAGNOSIS — K219 Gastro-esophageal reflux disease without esophagitis: Secondary | ICD-10-CM

## 2017-01-08 DIAGNOSIS — E781 Pure hyperglyceridemia: Secondary | ICD-10-CM | POA: Diagnosis not present

## 2017-01-08 DIAGNOSIS — I10 Essential (primary) hypertension: Secondary | ICD-10-CM

## 2017-01-08 LAB — BASIC METABOLIC PANEL
BUN: 17 mg/dL (ref 6–23)
CALCIUM: 10 mg/dL (ref 8.4–10.5)
CHLORIDE: 98 meq/L (ref 96–112)
CO2: 26 meq/L (ref 19–32)
CREATININE: 1.14 mg/dL (ref 0.40–1.50)
GFR: 67.56 mL/min (ref >60.00)
GLUCOSE: 89 mg/dL (ref 70–99)
Potassium: 3.9 mEq/L (ref 3.5–5.1)
Sodium: 131 mEq/L — ABNORMAL LOW (ref 135–145)

## 2017-01-08 MED ORDER — FENOFIBRATE 54 MG PO TABS: 54.0000 mg | ORAL_TABLET | Freq: Every day | ORAL | 3 refills | Status: DC

## 2017-01-08 NOTE — Progress Notes (Signed)
OFFICE VISIT  01/08/2017   CC:  Chief Complaint  Patient presents with  . Follow-up    RCI, pt is not fasting.   HPI:    Patient is a 71 y.o. Caucasian male who presents for 6 mo f/u HTN, hypertriglyceridemia, GERD. HTN: no home bp monitoring.  Takes med, tries to walk a couple times per week.  Hypertrig: compliant with meds, avoiding fast food, trying to drink skim milk.   Avoids red meat.  GERD: omeprazole qd controlling his GERD well and he also says it helps him with bowel regularity. He does not want to consider weening off his med.    ROS: no CP, no SOB, no no LE edema, no palpitations.  He is happy, no acute complaints.   Past Medical History:  Diagnosis Date  . Barrett's esophagus    Dr. Collene [name]  . Chronic renal insufficiency, stage 2 (mild) 2017   Stage II/III (GFR 60 ml/min)  . GERD (gastroesophageal reflux disease)   . History of colon polyps 09/2007   Polyps at 1st screening colonoscopy; 2014 no polyps--recall 10 yrs  . Hyperlipemia, mixed    Pt intol of welchol; fenofibrate helpful  . Hypertension   . Subclinical hypothyroidism    per old PCP records  . Thrombocytopenia (HCC)    Hx of, mild/stable with monitoring per old PCP records    Past Surgical History:  Procedure Laterality Date  . APPENDECTOMY    . COLONOSCOPY W/ POLYPECTOMY  2014   per Dr. Collene [name], recall 02/2023  . ESOPHAGOGASTRODUODENOSCOPY  2014; 2016; 11/16/16   2018--Barrett's esoph reconfirmed    Outpatient Medications Prior to Visit  Medication Sig Dispense Refill  . aspirin EC 81 MG tablet Take 81 mg by mouth every morning.    . fosinopril (MONOPRIL) 20 MG tablet Take 1 tablet (20 mg total) by mouth every morning. 90 tablet 3  . hydrochlorothiazide (HYDRODIURIL) 25 MG tablet TAKE ONE-HALF (1/2) TABLET EVERY MORNING 90 tablet 3  . omeprazole (PRILOSEC) 40 MG capsule Take 1 capsule by mouth daily.    . fenofibrate 54 MG tablet Take 1 tablet (54 mg total) by mouth daily. 90 tablet 3   No  facility-administered medications prior to visit.     No Known Allergies  ROS As per HPI  PE: Blood pressure 132/83, pulse 67, temperature 97.6 F (36.4 C), temperature source Oral, resp. rate 16, height 5\' 10"  (1.778 m), weight 215 lb 12 oz (97.9 kg), SpO2 96 %. Gen: Alert, well appearing.  Patient is oriented to person, place, time, and situation. AFFECT: pleasant, lucid thought and speech. CV: RRR, no m/r/g.   LUNGS: CTA bilat, nonlabored resps, good aeration in all lung fields. EXT: no clubbing, cyanosis, or edema.   LABS:  Lab Results  Component Value Date   TSH 3.86 05/22/2016   Lab Results  Component Value Date   WBC 6.7 08/27/2012   HGB 14.9 08/27/2012   HCT 43.1 08/27/2012   MCV 82.9 08/27/2012   PLT 170 08/27/2012   Lab Results  Component Value Date   CREATININE 1.30 05/22/2016   BUN 20 05/22/2016   NA 137 05/22/2016   K 4.3 05/22/2016   CL 102 05/22/2016   CO2 30 05/22/2016   Lab Results  Component Value Date   ALT 22 05/22/2016   AST 26 05/22/2016   ALKPHOS 56 05/22/2016   BILITOT 0.6 05/22/2016   Lab Results  Component Value Date   CHOL 124 05/22/2016   Lab Results  Component Value Date   HDL 39.50 05/22/2016   Lab Results  Component Value Date   LDLCALC 72 05/22/2016   Lab Results  Component Value Date   TRIG 61.0 05/22/2016   Lab Results  Component Value Date   CHOLHDL 3 05/22/2016   Lab Results  Component Value Date   PSA 1.78 05/22/2016   IMPRESSION AND PLAN:  1) HTN: The current medical regimen is effective;  continue present plan and medications. BMET today. DASH diet handout discussed and given to pt today.  2) Hypertriglyceridemia: very good diet changes, tolerating fenofibrate, last trig level 05/2016 was 61. The current medical regimen is effective;  continue present plan and medications. Recheck FLP next visit in 6 mo.  3) GERD: The current medical regimen is effective;  continue present plan and medications. Pt  wants to stay on this med indefinitely.  An After Visit Summary was printed and given to the patient.  FOLLOW UP: Return in about 6 months (around 07/08/2017) for annual CPE (fasting).  Signed:  Crissie Sickles, MD           01/08/2017

## 2017-01-11 ENCOUNTER — Encounter: Payer: Self-pay | Admitting: *Deleted

## 2017-04-20 ENCOUNTER — Encounter: Payer: Self-pay | Admitting: *Deleted

## 2017-04-21 ENCOUNTER — Ambulatory Visit (INDEPENDENT_AMBULATORY_CARE_PROVIDER_SITE_OTHER): Payer: Medicare Other | Admitting: Family Medicine

## 2017-04-21 ENCOUNTER — Encounter: Payer: Self-pay | Admitting: Family Medicine

## 2017-04-21 VITALS — BP 112/73 | HR 65 | Temp 97.9°F | Resp 16 | Ht 70.0 in | Wt 217.0 lb

## 2017-04-21 DIAGNOSIS — S76012A Strain of muscle, fascia and tendon of left hip, initial encounter: Secondary | ICD-10-CM

## 2017-04-21 MED ORDER — CYCLOBENZAPRINE HCL 10 MG PO TABS: 10.0000 mg | ORAL_TABLET | Freq: Three times a day (TID) | ORAL | 5 refills | Status: DC | PRN

## 2017-04-21 NOTE — Patient Instructions (Signed)
Try 3 of the over the counter generic ibuprofen tabs with food in the evenings.

## 2017-04-21 NOTE — Progress Notes (Signed)
OFFICE VISIT  04/21/2017   CC:  Chief Complaint  Patient presents with  . Leg Pain    left   HPI:    Patient is a 72 y.o. Caucasian male who presents for left leg pain. On and off x 5 yrs, getting worse x 1 yr. Hurts L groin as well as lateral aspect of hip.  Groin pain extends down top of thigh to level of knee at times. Groin worse when flexing hip.  Hurts worse after lots of activity like working in yard. Some cramping at night in muscles of L leg, upper and lower.  Takes 2 500 mg tylenol at night, also applies heat.  Gets some relief from these things. Sees chiropracter for his low back lumbago, got an adjustment last night.  ROS: no trauma to hip/back.  No fevers, night sweats, unexplained wt loss, or malaise.  Past Medical History:  Diagnosis Date  . Barrett's esophagus    Dr. Collene Mares  . Chronic renal insufficiency, stage 2 (mild) 2017   Stage II/III (GFR 60 ml/min)  . GERD (gastroesophageal reflux disease)   . History of colon polyps 09/2007   Polyps at 1st screening colonoscopy; 2014 no polyps--recall 10 yrs  . Hyperlipemia, mixed    Pt intol of welchol; fenofibrate helpful  . Hypertension   . Subclinical hypothyroidism    per old PCP records  . Thrombocytopenia (HCC)    Hx of, mild/stable with monitoring per old PCP records    Past Surgical History:  Procedure Laterality Date  . APPENDECTOMY    . COLONOSCOPY W/ POLYPECTOMY  2014   per Dr. Collene Mares, recall 02/2023  . ESOPHAGOGASTRODUODENOSCOPY  2014; 2016; 11/16/16   2018--Barrett's esoph reconfirmed    Outpatient Medications Prior to Visit  Medication Sig Dispense Refill  . aspirin EC 81 MG tablet Take 81 mg by mouth every morning.    . fenofibrate 54 MG tablet Take 1 tablet (54 mg total) by mouth daily. 90 tablet 3  . fosinopril (MONOPRIL) 20 MG tablet Take 1 tablet (20 mg total) by mouth every morning. 90 tablet 3  . hydrochlorothiazide (HYDRODIURIL) 25 MG tablet TAKE ONE-HALF (1/2) TABLET EVERY MORNING 90 tablet  3  . omeprazole (PRILOSEC) 40 MG capsule Take 1 capsule by mouth daily.     No facility-administered medications prior to visit.     No Known Allergies  ROS As per HPI  PE: Blood pressure 112/73, pulse 65, temperature 97.9 F (36.6 C), temperature source Oral, resp. rate 16, height 5\' 10"  (1.778 m), weight 217 lb (98.4 kg), SpO2 97 %. Gen: Alert, well appearing.  Patient is oriented to person, place, time, and situation. AFFECT: pleasant, lucid thought and speech. Minimal TTP in L groin/upper inner thigh as well as over L greater troch region. ER/IR as well as passive flexion/ext at hip is fine. He has significant pain in L groin with active hip flexion, worse with leg held out straight when lying supine.  Also pain in L groin with LL is extended at the knee against resistance. LE strength 5/5 prox/dist bilat.  LABS:    Chemistry      Component Value Date/Time   NA 131 (L) 01/08/2017 0832   K 3.9 01/08/2017 0832   CL 98 01/08/2017 0832   CO2 26 01/08/2017 0832   BUN 17 01/08/2017 0832   CREATININE 1.14 01/08/2017 0832      Component Value Date/Time   CALCIUM 10.0 01/08/2017 0832   ALKPHOS 56 05/22/2016 0805  AST 26 05/22/2016 0805   ALT 22 05/22/2016 0805   BILITOT 0.6 05/22/2016 0805     Lab Results  Component Value Date   CHOL 124 05/22/2016   HDL 39.50 05/22/2016   LDLCALC 72 05/22/2016   TRIG 61.0 05/22/2016   CHOLHDL 3 05/22/2016     IMPRESSION AND PLAN:  Chronic L groin pain/strain: he has never been able to adequately rest/rehab this over the last 5 yrs. I recommended PT but he prefers to increase his chiropractor visits to q2 wks since he has lots of confidence in his chiropractor. He'll try 600 mg ibuprofen hs to compare to his 1000 mg tylenol. Also, flexeril 10mg  tid prn rx'd today. Therapeutic expectations and side effect profile of medication discussed today.  Patient's questions answered.  An After Visit Summary was printed and given to the  patient.  FOLLOW UP: Return if symptoms worsen or fail to improve.  Signed:  Crissie Sickles, MD           04/21/2017

## 2017-06-07 ENCOUNTER — Ambulatory Visit: Payer: Medicare Other

## 2017-07-08 ENCOUNTER — Ambulatory Visit (INDEPENDENT_AMBULATORY_CARE_PROVIDER_SITE_OTHER): Payer: Medicare Other | Admitting: Family Medicine

## 2017-07-08 ENCOUNTER — Telehealth: Payer: Self-pay | Admitting: *Deleted

## 2017-07-08 ENCOUNTER — Encounter: Payer: Self-pay | Admitting: Family Medicine

## 2017-07-08 VITALS — BP 134/73 | HR 61 | Temp 97.6°F | Resp 16 | Ht 70.0 in | Wt 219.1 lb

## 2017-07-08 DIAGNOSIS — E782 Mixed hyperlipidemia: Secondary | ICD-10-CM | POA: Diagnosis not present

## 2017-07-08 DIAGNOSIS — Z125 Encounter for screening for malignant neoplasm of prostate: Secondary | ICD-10-CM | POA: Diagnosis not present

## 2017-07-08 DIAGNOSIS — I10 Essential (primary) hypertension: Secondary | ICD-10-CM | POA: Diagnosis not present

## 2017-07-08 DIAGNOSIS — N182 Chronic kidney disease, stage 2 (mild): Secondary | ICD-10-CM | POA: Diagnosis not present

## 2017-07-08 LAB — LIPID PANEL
Cholesterol: 183 mg/dL (ref 0–200)
HDL: 51.7 mg/dL (ref >39.00)
LDL Cholesterol: 116 mg/dL — ABNORMAL HIGH (ref 0–99)
NONHDL: 130.99
TRIGLYCERIDES: 77 mg/dL (ref 0.0–149.0)
Total CHOL/HDL Ratio: 4
VLDL: 15.4 mg/dL (ref 0.0–40.0)

## 2017-07-08 LAB — COMPREHENSIVE METABOLIC PANEL
ALT: 22 U/L (ref 0–53)
AST: 31 U/L (ref 0–37)
Albumin: 4.4 g/dL (ref 3.5–5.2)
Alkaline Phosphatase: 54 U/L (ref 39–117)
BUN: 25 mg/dL — AB (ref 6–23)
CHLORIDE: 100 meq/L (ref 96–112)
CO2: 26 mEq/L (ref 19–32)
Calcium: 9.8 mg/dL (ref 8.4–10.5)
Creatinine, Ser: 1.17 mg/dL (ref 0.40–1.50)
GFR: 65.47 mL/min (ref >60.00)
GLUCOSE: 104 mg/dL — AB (ref 70–99)
POTASSIUM: 4 meq/L (ref 3.5–5.1)
Sodium: 134 mEq/L — ABNORMAL LOW (ref 135–145)
Total Bilirubin: 0.5 mg/dL (ref 0.2–1.2)
Total Protein: 7.2 g/dL (ref 6.0–8.3)

## 2017-07-08 LAB — PSA, MEDICARE: PSA: 1.86 ng/ml (ref 0.10–4.00)

## 2017-07-08 NOTE — Telephone Encounter (Signed)
Pt declined to schedule AWV with [facility].

## 2017-07-08 NOTE — Progress Notes (Signed)
OFFICE VISIT  07/08/2017   CC:  Chief Complaint  Patient presents with  . Follow-up    RCI, pt is not fasting.    HPI:    Patient is a 72 y.o. Caucasian male who presents for 6 mo f/u HTN, hypertriglyceridemia, CRI with GFR around 60 ml/min. Feeling well, doing yard work.  HTN: no home bp monitoring.  Stays active.  Triglyc: taking fenofibrate daily.  Diet: eats w/out regard to to food type, caloric content, or portion size.  CRI: takes 2 motrin about 2 times per week.  Drinks 4 pots of diluted coffee per day.  ROS: no SOB, CP, LE swelling, melena/hematochezia, muscle or joint aches, rashes, or palpitations or dizziness.  Past Medical History:  Diagnosis Date  . Barrett's esophagus    Dr. Collene Mares  . Chronic renal insufficiency, stage 2 (mild) 2017   Stage II/III (GFR 60 ml/min)  . GERD (gastroesophageal reflux disease)   . History of colon polyps 09/2007   Polyps at 1st screening colonoscopy; 2014 no polyps--recall 10 yrs  . Hyperlipemia, mixed    Pt intol of welchol; fenofibrate helpful  . Hypertension   . Subclinical hypothyroidism    per old PCP records  . Thrombocytopenia (HCC)    Hx of, mild/stable with monitoring per old PCP records    Past Surgical History:  Procedure Laterality Date  . APPENDECTOMY    . COLONOSCOPY W/ POLYPECTOMY  2014   per Dr. Collene Mares, recall 02/2023  . ESOPHAGOGASTRODUODENOSCOPY  2014; 2016; 11/16/16   2018--Barrett's esoph reconfirmed    Outpatient Medications Prior to Visit  Medication Sig Dispense Refill  . aspirin EC 81 MG tablet Take 81 mg by mouth every morning.    . fenofibrate 54 MG tablet Take 1 tablet (54 mg total) by mouth daily. 90 tablet 3  . fosinopril (MONOPRIL) 20 MG tablet Take 1 tablet (20 mg total) by mouth every morning. 90 tablet 3  . hydrochlorothiazide (HYDRODIURIL) 25 MG tablet TAKE ONE-HALF (1/2) TABLET EVERY MORNING 90 tablet 3  . omeprazole (PRILOSEC) 40 MG capsule Take 1 capsule by mouth daily.    .  cyclobenzaprine (FLEXERIL) 10 MG tablet Take 1 tablet (10 mg total) by mouth 3 (three) times daily as needed for muscle spasms. (Patient not taking: Reported on 07/08/2017) 30 tablet 5   No facility-administered medications prior to visit.     No Known Allergies  ROS As per HPI  PE: Blood pressure 134/73, pulse 61, temperature 97.6 F (36.4 C), temperature source Oral, resp. rate 16, height 5\' 10"  (1.778 m), weight 219 lb 2 oz (99.4 kg), SpO2 98 %. Gen: Alert, well appearing.  Patient is oriented to person, place, time, and situation. AFFECT: pleasant, lucid thought and speech. CV: RRR, no m/r/g.   LUNGS: CTA bilat, nonlabored resps, good aeration in all lung fields. EXT: no clubbing, cyanosis, or edema.    LABS:  Lab Results  Component Value Date   TSH 3.86 05/22/2016   Lab Results  Component Value Date   WBC 6.7 08/27/2012   HGB 14.9 08/27/2012   HCT 43.1 08/27/2012   MCV 82.9 08/27/2012   PLT 170 08/27/2012   Lab Results  Component Value Date   CREATININE 1.14 01/08/2017   BUN 17 01/08/2017   NA 131 (L) 01/08/2017   K 3.9 01/08/2017   CL 98 01/08/2017   CO2 26 01/08/2017   Lab Results  Component Value Date   ALT 22 05/22/2016   AST 26 05/22/2016  ALKPHOS 56 05/22/2016   BILITOT 0.6 05/22/2016   Lab Results  Component Value Date   CHOL 124 05/22/2016   Lab Results  Component Value Date   HDL 39.50 05/22/2016   Lab Results  Component Value Date   LDLCALC 72 05/22/2016   Lab Results  Component Value Date   TRIG 61.0 05/22/2016   Lab Results  Component Value Date   CHOLHDL 3 05/22/2016   Lab Results  Component Value Date   PSA 1.78 05/22/2016    IMPRESSION AND PLAN:  1) HTN: The current medical regimen is effective;  continue present plan and medications. Lytes/cr today.  2) Hyperlipidemia, mixed: doing well on fenofibrate. FLP today.  3) CRI w/GFR 60 ml/min. Avoid regular use of NSAIDs.  Hydrating well. LytesCr today.  4) Prostate  ca screening: PSA today.  An After Visit Summary was printed and given to the patient.   FOLLOW UP: 6 mo RCI (fasting) + AWV with kim any time.  Signed:  Crissie Sickles, MD           07/08/2017

## 2017-07-22 ENCOUNTER — Other Ambulatory Visit: Payer: Self-pay | Admitting: Family Medicine

## 2017-09-01 ENCOUNTER — Other Ambulatory Visit: Payer: Self-pay | Admitting: Family Medicine

## 2017-09-25 ENCOUNTER — Emergency Department (HOSPITAL_COMMUNITY)
Admission: EM | Admit: 2017-09-25 | Discharge: 2017-09-25 | Disposition: A | Discharge status: Home / Self Care | Payer: Medicare Other | Attending: Emergency Medicine | Admitting: Emergency Medicine

## 2017-09-25 ENCOUNTER — Other Ambulatory Visit: Payer: Self-pay

## 2017-09-25 ENCOUNTER — Emergency Department (HOSPITAL_COMMUNITY): Payer: Medicare Other

## 2017-09-25 ENCOUNTER — Encounter (HOSPITAL_COMMUNITY): Payer: Self-pay | Admitting: Emergency Medicine

## 2017-09-25 DIAGNOSIS — S79921A Unspecified injury of right thigh, initial encounter: Secondary | ICD-10-CM | POA: Diagnosis present

## 2017-09-25 DIAGNOSIS — Y999 Unspecified external cause status: Secondary | ICD-10-CM | POA: Diagnosis not present

## 2017-09-25 DIAGNOSIS — Y939 Activity, unspecified: Secondary | ICD-10-CM | POA: Insufficient documentation

## 2017-09-25 DIAGNOSIS — Z7982 Long term (current) use of aspirin: Secondary | ICD-10-CM | POA: Insufficient documentation

## 2017-09-25 DIAGNOSIS — Y929 Unspecified place or not applicable: Secondary | ICD-10-CM | POA: Diagnosis not present

## 2017-09-25 DIAGNOSIS — E039 Hypothyroidism, unspecified: Secondary | ICD-10-CM | POA: Insufficient documentation

## 2017-09-25 DIAGNOSIS — S7011XA Contusion of right thigh, initial encounter: Secondary | ICD-10-CM | POA: Diagnosis not present

## 2017-09-25 DIAGNOSIS — I10 Essential (primary) hypertension: Secondary | ICD-10-CM | POA: Diagnosis not present

## 2017-09-25 DIAGNOSIS — Z79899 Other long term (current) drug therapy: Secondary | ICD-10-CM | POA: Diagnosis not present

## 2017-09-25 DIAGNOSIS — M7989 Other specified soft tissue disorders: Secondary | ICD-10-CM | POA: Diagnosis not present

## 2017-09-25 DIAGNOSIS — X58XXXA Exposure to other specified factors, initial encounter: Secondary | ICD-10-CM | POA: Insufficient documentation
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
## 2017-09-25 NOTE — Discharge Instructions (Addendum)
Follow up with Dr. Anitra Lauth if swelling persist

## 2017-10-02 DIAGNOSIS — S8011XD Contusion of right lower leg, subsequent encounter: Secondary | ICD-10-CM | POA: Diagnosis not present

## 2017-10-04 ENCOUNTER — Encounter: Payer: Self-pay | Admitting: Family Medicine

## 2017-10-04 ENCOUNTER — Ambulatory Visit (INDEPENDENT_AMBULATORY_CARE_PROVIDER_SITE_OTHER): Payer: Medicare Other | Admitting: Family Medicine

## 2017-10-04 VITALS — BP 127/75 | HR 66 | Temp 97.7°F | Resp 16 | Ht 70.0 in | Wt 222.0 lb

## 2017-10-04 DIAGNOSIS — S8001XD Contusion of right knee, subsequent encounter: Secondary | ICD-10-CM | POA: Diagnosis not present

## 2017-10-04 DIAGNOSIS — S7011XD Contusion of right thigh, subsequent encounter: Secondary | ICD-10-CM

## 2017-10-04 DIAGNOSIS — S8011XD Contusion of right lower leg, subsequent encounter: Secondary | ICD-10-CM | POA: Diagnosis not present
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
## 2017-10-12 ENCOUNTER — Ambulatory Visit: Payer: Self-pay
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
## 2017-11-05 ENCOUNTER — Other Ambulatory Visit: Payer: Self-pay

## 2017-11-05 ENCOUNTER — Ambulatory Visit: Payer: Medicare Other

## 2017-11-05 ENCOUNTER — Ambulatory Visit (INDEPENDENT_AMBULATORY_CARE_PROVIDER_SITE_OTHER): Payer: Medicare Other

## 2017-11-05 VITALS — BP 120/68 | HR 71 | Ht 70.0 in | Wt 224.0 lb

## 2017-11-05 DIAGNOSIS — Z0000 Encounter for general adult medical examination without abnormal findings: Secondary | ICD-10-CM | POA: Diagnosis not present

## 2017-11-05 DIAGNOSIS — E669 Obesity, unspecified: Secondary | ICD-10-CM

## 2017-11-05 NOTE — Progress Notes (Signed)
Subjective:   Shawn [name] is a 72 y.o. male who presents for Medicare Annual/Subsequent preventive examination.  Review of Systems:  No ROS.  Medicare Wellness Visit. Additional risk factors are reflected in the social history.  Cardiac Risk Factors include: advanced age (>4men, >30 women);dyslipidemia;male gender;hypertension;obesity (BMI >30kg/m2)   Sleep patterns: Sleeps 5-7 hours.  Home Safety/Smoke Alarms: Feels safe in home. Smoke alarms in place.  Living environment; residence and Firearm Safety: Lives with wife and 95 yo granddaughter in 22 story home. Steps with rail. Seat Belt Safety/Bike Helmet: Wears seat belt.   Male:   CCS-Colonoscopy, 03/06/2013, pt reports normal. Recall 10 years    PSA-  Lab Results  Component Value Date   PSA 1.86 07/08/2017   PSA 1.78 05/22/2016       Objective:    Vitals: BP 120/68 (BP Location: Left Arm, Patient Position: Sitting, Cuff Size: Normal)   Pulse 71   Ht 5\' 10"  (1.778 m)   Wt 224 lb (101.6 kg)   SpO2 97%   BMI 32.14 kg/m   Body mass index is 32.14 kg/m.  Advanced Directives 11/05/2017 06/05/2016  Does Patient Have a Medical Advance Directive? Yes No  Type of Paramedic of [place];Living will -  Does patient want to make changes to medical advance directive? - No - Patient declined  Copy of [place] in Chart? No - copy requested -    Tobacco Social History   Tobacco Use  Smoking Status Never Smoker  Smokeless Tobacco Never Used     Counseling given: Not Answered   Past Medical History:  Diagnosis Date  . Barrett's esophagus    Dr. Collene Mares  . Chronic renal insufficiency, stage 2 (mild) 2017   Stage II/III (GFR 60 ml/min)  . GERD (gastroesophageal reflux disease)   . History of colon polyps 09/2007   Polyps at 1st screening colonoscopy; 2014 no polyps--recall 10 yrs  . Hyperlipemia, mixed    Pt intol of welchol; fenofibrate helpful  . Hypertension   . Subclinical  hypothyroidism    per old PCP records  . Thrombocytopenia (HCC)    Hx of, mild/stable with monitoring per old PCP records   Past Surgical History:  Procedure Laterality Date  . APPENDECTOMY    . COLONOSCOPY W/ POLYPECTOMY  2014   per Dr. Collene Mares, recall 02/2023  . ESOPHAGOGASTRODUODENOSCOPY  2014; 2016; 11/16/16   2018--Barrett's esoph reconfirmed   Family History  Problem Relation Age of Onset  . Hypertension Mother   . Hyperlipidemia Mother   . Arthritis Father   . Diabetes Paternal Grandmother 34   Social History   Socioeconomic History  . Marital status: Married    Spouse name: Not on file  . Number of children: Not on file  . Years of education: Not on file  . Highest education level: Not on file  Occupational History  . Not on file  Social Needs  . Financial resource strain: Not on file  . Food insecurity:    Worry: Not on file    Inability: Not on file  . Transportation needs:    Medical: Not on file    Non-medical: Not on file  Tobacco Use  . Smoking status: Never Smoker  . Smokeless tobacco: Never Used  Substance and Sexual Activity  . Alcohol use: No  . Drug use: No  . Sexual activity: Not on file  Lifestyle  . Physical activity:    Days per week: Not  on file    Minutes per session: Not on file  . Stress: Not on file  Relationships  . Social connections:    Talks on phone: Not on file    Gets together: Not on file    Attends religious service: Not on file    Active member of club or organization: Not on file    Attends meetings of clubs or organizations: Not on file    Relationship status: Not on file  Other Topics Concern  . Not on file  Social History Narrative   Married, grand-daughter lives with him.   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   Orig from [place] and [place].   No T/A/Ds.   Exercises 3 X/week--walking 2-3 miles.    Outpatient Encounter Medications as of 11/05/2017  Medication Sig  . aspirin EC  81 MG tablet Take 81 mg by mouth every morning.  . fenofibrate 54 MG tablet Take 1 tablet (54 mg total) by mouth daily.  . fosinopril (MONOPRIL) 20 MG tablet TAKE 1 TABLET EVERY MORNING  . hydrochlorothiazide (HYDRODIURIL) 25 MG tablet TAKE ONE-HALF (1/2) TABLET EVERY MORNING  . omeprazole (PRILOSEC) 40 MG capsule Take 1 capsule by mouth daily.  . cyclobenzaprine (FLEXERIL) 10 MG tablet Take 1 tablet (10 mg total) by mouth 3 (three) times daily as needed for muscle spasms. (Patient not taking: Reported on 07/08/2017)   No facility-administered encounter medications on file as of 11/05/2017.     Activities of Daily Living In your present state of health, do you have any difficulty performing the following activities: 11/05/2017  Hearing? N  Vision? N  Difficulty concentrating or making decisions? N  Walking or climbing stairs? N  Comment pain in knees with multiple steps  Dressing or bathing? N  Doing errands, shopping? N  Preparing Food and eating ? N  Using the Toilet? N  In the past six months, have you accidently leaked urine? N  Do you have problems with loss of bowel control? N  Managing your Medications? N  Managing your Finances? N  Housekeeping or managing your Housekeeping? N  Some recent data might be hidden    Patient Care Team: Tammi Sou, MD as PCP - General (Family Medicine) Juanita Craver, MD as Consulting Physician (Gastroenterology) Francee Gentile, [place] as Referring Physician (Chiropractic Medicine)   Assessment:   This is a routine wellness examination for [facility].  Exercise Activities and Dietary recommendations Exercise limited by: None identified   Diet (meal preparation, eat out, water intake, caffeinated beverages, dairy products, fruits and vegetables): Drinks 4 POTS of coffee/daily.   Breakfast: Typically skips; eggs w/veggies; [place] hotdog; coffee (pot) Lunch: hotdog; leftovers Dinner: protein; starch; veggies  Goals    . Patient Stated      Maintain current health by staying active.        Fall Risk Fall Risk  11/05/2017 07/08/2017 06/05/2016  Falls in the past year? Yes Yes No  Comment falling off bike, torn tendons/ligaments - -  Number falls in past yr: 1 2 or more -  Injury with Fall? Yes No -  Comment - Pt stated that he tripped while working outside a few times -  Follow up Falls prevention discussed - -    Depression Screen PHQ 2/9 Scores 11/05/2017 07/08/2017 06/05/2016  PHQ - 2 Score 0 0 0    Cognitive Function       Ad8 score reviewed for issues: no  Issues making decisions: no  Less interest in hobbies / activities: no  Repeats questions, stories (family complaining): no  Trouble using ordinary gadgets (microwave, computer, phone): no  Forgets the month or year: no  Mismanaging finances: no  Remembering appts: no  Daily problems with thinking and/or memory: no Ad8 score is=0     Immunization History  Administered Date(s) Administered  . Influenza-Unspecified 12/05/2014, 01/12/2016, 12/14/2016  . Pneumococcal Conjugate-13 10/26/2014  . Pneumococcal Polysaccharide-23 08/31/2012  . Tdap 05/17/2006     Screening Tests Health Maintenance  Topic Date Due  . TETANUS/TDAP  11/06/2018 (Originally 05/17/2016)  . INFLUENZA VACCINE  11/11/2017  . COLONOSCOPY  03/07/2023  . PNA vac Low Risk Adult  Completed  . Hepatitis C Screening  Discontinued    Declines Shingrix.     Plan:    Bring a copy of your living will and/or healthcare power of attorney to your next office visit.  Continue doing brain stimulating activities (puzzles, reading, adult coloring books, staying active) to keep memory sharp.   I have personally reviewed and noted the following in the patient's chart:   . Medical and social history . Use of alcohol, tobacco or illicit drugs  . Current medications and supplements . Functional ability and status . Nutritional status . Physical activity . Advanced directives . List  of other physicians . Hospitalizations, surgeries, and ER visits in previous 12 months . Vitals . Screenings to include cognitive, depression, and falls . Referrals and appointments  In addition, I have reviewed and discussed with patient certain preventive protocols, quality metrics, and best practice recommendations. A written personalized care plan for preventive services as well as general preventive health recommendations were provided to patient.     Gerilyn Nestle, RN  11/05/2017  F/U with PCP 12/2017

## 2017-11-05 NOTE — Patient Instructions (Addendum)
Bring a copy of your living will and/or healthcare power of attorney to your next office visit.  Continue doing brain stimulating activities (puzzles, reading, adult coloring books, staying active) to keep memory sharp.    Health Maintenance, Male A healthy lifestyle and preventive care is important for your health and wellness. Ask your health care provider about what schedule of regular examinations is right for you. What should I know about weight and diet? Eat a Healthy Diet  Eat plenty of vegetables, fruits, whole grains, low-fat dairy products, and lean protein.  Do not eat a lot of foods high in solid fats, added sugars, or salt.  Maintain a Healthy Weight Regular exercise can help you achieve or maintain a healthy weight. You should:  Do at least 150 minutes of exercise each week. The exercise should increase your heart rate and make you sweat (moderate-intensity exercise).  Do strength-training exercises at least twice a week.  Watch Your Levels of Cholesterol and Blood Lipids  Have your blood tested for lipids and cholesterol every 5 years starting at 70 years of age. If you are at high risk for heart disease, you should start having your blood tested when you are 70 years old. You may need to have your cholesterol levels checked more often if: ? Your lipid or cholesterol levels are high. ? You are older than 70 years of age. ? You are at high risk for heart disease.  What should I know about cancer screening? Many types of cancers can be detected early and may often be prevented. Lung Cancer  You should be screened every year for lung cancer if: ? You are a current smoker who has smoked for at least 30 years. ? You are a former smoker who has quit within the past 15 years.  Talk to your health care provider about your screening options, when you should start screening, and how often you should be screened.  Colorectal Cancer  Routine colorectal cancer screening  usually begins at 70 years of age and should be repeated every 5-10 years until you are 70 years old. You may need to be screened more often if early forms of precancerous polyps or small growths are found. Your health care provider may recommend screening at an earlier age if you have risk factors for colon cancer.  Your health care provider may recommend using home test kits to check for hidden blood in the stool.  A small camera at the end of a tube can be used to examine your colon (sigmoidoscopy or colonoscopy). This checks for the earliest forms of colorectal cancer.  Prostate and Testicular Cancer  Depending on your age and overall health, your health care provider may do certain tests to screen for prostate and testicular cancer.  Talk to your health care provider about any symptoms or concerns you have about testicular or prostate cancer.  Skin Cancer  Check your skin from head to toe regularly.  Tell your health care provider about any new moles or changes in moles, especially if: ? There is a change in a mole's size, shape, or color. ? You have a mole that is larger than a pencil eraser.  Always use sunscreen. Apply sunscreen liberally and repeat throughout the day.  Protect yourself by wearing long sleeves, pants, a wide-brimmed hat, and sunglasses when outside.  What should I know about heart disease, diabetes, and high blood pressure?  If you are 18-39 years of age, have your blood pressure checked every   3-5 years. If you are 40 years of age or older, have your blood pressure checked every year. You should have your blood pressure measured twice-once when you are at a hospital or clinic, and once when you are not at a hospital or clinic. Record the average of the two measurements. To check your blood pressure when you are not at a hospital or clinic, you can use: ? An automated blood pressure machine at a pharmacy. ? A home blood pressure monitor.  Talk to your health care  provider about your target blood pressure.  If you are between 45-79 years old, ask your health care provider if you should take aspirin to prevent heart disease.  Have regular diabetes screenings by checking your fasting blood sugar level. ? If you are at a normal weight and have a low risk for diabetes, have this test once every three years after the age of 45. ? If you are overweight and have a high risk for diabetes, consider being tested at a younger age or more often.  A one-time screening for abdominal aortic aneurysm (AAA) by ultrasound is recommended for men aged 65-75 years who are current or former smokers. What should I know about preventing infection? Hepatitis B If you have a higher risk for hepatitis B, you should be screened for this virus. Talk with your health care provider to find out if you are at risk for hepatitis B infection. Hepatitis C Blood testing is recommended for:  Everyone born from 1945 through 1965.  Anyone with known risk factors for hepatitis C.  Sexually Transmitted Diseases (STDs)  You should be screened each year for STDs including gonorrhea and chlamydia if: ? You are sexually active and are younger than 70 years of age. ? You are older than 70 years of age and your health care provider tells you that you are at risk for this type of infection. ? Your sexual activity has changed since you were last screened and you are at an increased risk for chlamydia or gonorrhea. Ask your health care provider if you are at risk.  Talk with your health care provider about whether you are at high risk of being infected with HIV. Your health care provider may recommend a prescription medicine to help prevent HIV infection.  What else can I do?  Schedule regular health, dental, and eye exams.  Stay current with your vaccines (immunizations).  Do not use any tobacco products, such as cigarettes, chewing tobacco, and e-cigarettes. If you need help quitting, ask  your health care provider.  Limit alcohol intake to no more than 2 drinks per day. One drink equals 12 ounces of beer, 5 ounces of wine, or 1 ounces of hard liquor.  Do not use street drugs.  Do not share needles.  Ask your health care provider for help if you need support or information about quitting drugs.  Tell your health care provider if you often feel depressed.  Tell your health care provider if you have ever been abused or do not feel safe at home. This information is not intended to replace advice given to you by your health care provider. Make sure you discuss any questions you have with your health care provider. Document Released: 09/26/2007 Document Revised: 11/27/2015 Document Reviewed: 01/01/2015 Elsevier Interactive Patient Education  2018 [employer]  

## 2017-11-09 NOTE — Progress Notes (Signed)
AWV reviewed and agree. Signed:  Crissie Sickles, MD           11/09/2017

## 2017-12-21 DIAGNOSIS — Z1211 Encounter for screening for malignant neoplasm of colon: Secondary | ICD-10-CM | POA: Diagnosis not present

## 2017-12-21 DIAGNOSIS — K219 Gastro-esophageal reflux disease without esophagitis: Secondary | ICD-10-CM | POA: Diagnosis not present

## 2017-12-21 DIAGNOSIS — E669 Obesity, unspecified: Secondary | ICD-10-CM | POA: Diagnosis not present

## 2017-12-21 DIAGNOSIS — R194 Change in bowel habit: Secondary | ICD-10-CM | POA: Diagnosis not present

## 2017-12-27 DIAGNOSIS — L814 Other melanin hyperpigmentation: Secondary | ICD-10-CM | POA: Diagnosis not present

## 2017-12-27 DIAGNOSIS — L821 Other seborrheic keratosis: Secondary | ICD-10-CM | POA: Diagnosis not present

## 2017-12-27 DIAGNOSIS — D229 Melanocytic nevi, unspecified: Secondary | ICD-10-CM | POA: Diagnosis not present

## 2017-12-27 DIAGNOSIS — L578 Other skin changes due to chronic exposure to nonionizing radiation: Secondary | ICD-10-CM | POA: Diagnosis not present

## 2017-12-27 DIAGNOSIS — Z872 Personal history of diseases of the skin and subcutaneous tissue: Secondary | ICD-10-CM | POA: Diagnosis not present

## 2017-12-27 DIAGNOSIS — L570 Actinic keratosis: Secondary | ICD-10-CM | POA: Diagnosis not present

## 2017-12-29 ENCOUNTER — Encounter: Payer: Self-pay | Admitting: Family Medicine

## 2017-12-29 DIAGNOSIS — Z1211 Encounter for screening for malignant neoplasm of colon: Secondary | ICD-10-CM | POA: Diagnosis not present

## 2017-12-29 DIAGNOSIS — K635 Polyp of colon: Secondary | ICD-10-CM | POA: Diagnosis not present

## 2017-12-29 DIAGNOSIS — D122 Benign neoplasm of ascending colon: Secondary | ICD-10-CM | POA: Diagnosis not present

## 2017-12-29 DIAGNOSIS — R194 Change in bowel habit: Secondary | ICD-10-CM | POA: Diagnosis not present

## 2017-12-29 HISTORY — PX: COLONOSCOPY: SHX174

## 2017-12-29 LAB — HM COLONOSCOPY

## 2018-01-05 ENCOUNTER — Encounter: Payer: Self-pay | Admitting: *Deleted

## 2018-01-06 ENCOUNTER — Ambulatory Visit (INDEPENDENT_AMBULATORY_CARE_PROVIDER_SITE_OTHER): Payer: Medicare Other | Admitting: Family Medicine

## 2018-01-06 ENCOUNTER — Encounter: Payer: Self-pay | Admitting: Family Medicine

## 2018-01-06 VITALS — BP 143/75 | HR 57 | Temp 97.7°F | Resp 16 | Ht 70.0 in | Wt 218.4 lb

## 2018-01-06 DIAGNOSIS — R7989 Other specified abnormal findings of blood chemistry: Secondary | ICD-10-CM

## 2018-01-06 DIAGNOSIS — E039 Hypothyroidism, unspecified: Secondary | ICD-10-CM | POA: Diagnosis not present

## 2018-01-06 DIAGNOSIS — I10 Essential (primary) hypertension: Secondary | ICD-10-CM

## 2018-01-06 DIAGNOSIS — E669 Obesity, unspecified: Secondary | ICD-10-CM

## 2018-01-06 DIAGNOSIS — E782 Mixed hyperlipidemia: Secondary | ICD-10-CM

## 2018-01-06 DIAGNOSIS — E038 Other specified hypothyroidism: Secondary | ICD-10-CM

## 2018-01-06 LAB — BASIC METABOLIC PANEL
BUN: 21 mg/dL (ref 6–23)
CALCIUM: 9.8 mg/dL (ref 8.4–10.5)
CO2: 25 mEq/L (ref 19–32)
CREATININE: 1.17 mg/dL (ref 0.40–1.50)
Chloride: 102 mEq/L (ref 96–112)
GFR: 65.38 mL/min (ref >60.00)
Glucose, Bld: 88 mg/dL (ref 70–99)
Potassium: 4 mEq/L (ref 3.5–5.1)
SODIUM: 136 meq/L (ref 135–145)

## 2018-01-06 LAB — LIPID PANEL
Cholesterol: 177 mg/dL (ref 0–200)
HDL: 38 mg/dL — ABNORMAL LOW (ref >39.00)
LDL Cholesterol: 110 mg/dL — ABNORMAL HIGH (ref 0–99)
NONHDL: 138.97
TRIGLYCERIDES: 144 mg/dL (ref 0.0–149.0)
Total CHOL/HDL Ratio: 5
VLDL: 28.8 mg/dL (ref 0.0–40.0)

## 2018-01-06 LAB — T4, FREE: FREE T4: 0.95 ng/dL (ref 0.60–1.60)

## 2018-01-06 LAB — TSH: TSH: 6.6 u[IU]/mL — AB (ref 0.35–4.50)

## 2018-01-06 NOTE — Progress Notes (Signed)
OFFICE VISIT  01/06/2018   CC:  Chief Complaint  Patient presents with  . Follow-up    RCI, pt is fasting.     HPI:    Patient is a 72 y.o. Caucasian male who presents for 6 mo f/u HTN, mixed hyperlipidemia, and CRI with GFR around 60 ml/min.  He brings in a TSH result from Dr. Lorie Apley office that is 6.65, mildly high--dated 12/21/17.  He does not take biotin. He says he is having some trouble losing weight. CBC was normal at same lab check.  Home bp usually avg 130/80.  Compliant with meds. Taking fibrate daily, no side effects CRI: stays well hydrated.  He avoids ibuprofen or aleve.    ROS: no CP, no SOB, no wheezing, no cough, no dizziness, no HAs, no rashes, no melena/hematochezia.  No polyuria or polydipsia.  No myalgias or arthralgias.  Past Medical History:  Diagnosis Date  . Barrett's esophagus    Dr. Collene Mares  . Chronic renal insufficiency, stage 2 (mild) 2017   Stage II/III (GFR 60 ml/min)  . GERD (gastroesophageal reflux disease)   . History of adenomatous polyp of colon 09/2007   Polyps at 1st screening colonoscopy; 2014 no polyps.  Rpt 12/29/17: adenomatous polyp-->  Recall 5 yrs.  . Hyperlipemia, mixed    Pt intol of welchol; fenofibrate helpful  . Hypertension   . Subclinical hypothyroidism    per old PCP records  . Thrombocytopenia (HCC)    Hx of, mild/stable with monitoring per old PCP records    Past Surgical History:  Procedure Laterality Date  . APPENDECTOMY    . COLONOSCOPY  12/29/2017   polypectomy --path - adenoma,  Repeat in 5 years  . COLONOSCOPY W/ POLYPECTOMY  2014   per Dr. Collene Mares, recall 02/2023  . ESOPHAGOGASTRODUODENOSCOPY  2014; 2016; 11/16/16   2018--Barrett's esoph reconfirmed    Outpatient Medications Prior to Visit  Medication Sig Dispense Refill  . aspirin EC 81 MG tablet Take 81 mg by mouth every morning.    . fenofibrate 54 MG tablet Take 1 tablet (54 mg total) by mouth daily. 90 tablet 3  . fosinopril (MONOPRIL) 20 MG tablet TAKE  1 TABLET EVERY MORNING 90 tablet 1  . hydrochlorothiazide (HYDRODIURIL) 25 MG tablet TAKE ONE-HALF (1/2) TABLET EVERY MORNING 90 tablet 1  . omeprazole (PRILOSEC) 40 MG capsule Take 1 capsule by mouth daily.    . cyclobenzaprine (FLEXERIL) 10 MG tablet Take 1 tablet (10 mg total) by mouth 3 (three) times daily as needed for muscle spasms. (Patient not taking: Reported on 07/08/2017) 30 tablet 5   No facility-administered medications prior to visit.     No Known Allergies  ROS As per HPI  PE: Blood pressure (!) 143/75, pulse (!) 57, temperature 97.7 F (36.5 C), temperature source Oral, resp. rate 16, height 5\' 10"  (1.778 m), weight 218 lb 6 oz (99.1 kg), SpO2 97 %. Body mass index is 31.33 kg/m.  Gen: Alert, well appearing.  Patient is oriented to person, place, time, and situation. AFFECT: pleasant, lucid thought and speech. CV: RRR, no m/r/g.   LUNGS: CTA bilat, nonlabored resps, good aeration in all lung fields. EXT: no clubbing or cyanosis.  no edema.    LABS:  Lab Results  Component Value Date   TSH 3.86 05/22/2016   Lab Results  Component Value Date   WBC 6.7 08/27/2012   HGB 14.9 08/27/2012   HCT 43.1 08/27/2012   MCV 82.9 08/27/2012   PLT 170  08/27/2012   Lab Results  Component Value Date   CREATININE 1.17 07/08/2017   BUN 25 (H) 07/08/2017   NA 134 (L) 07/08/2017   K 4.0 07/08/2017   CL 100 07/08/2017   CO2 26 07/08/2017   Lab Results  Component Value Date   ALT 22 07/08/2017   AST 31 07/08/2017   ALKPHOS 54 07/08/2017   BILITOT 0.5 07/08/2017   Lab Results  Component Value Date   CHOL 183 07/08/2017   Lab Results  Component Value Date   HDL 51.70 07/08/2017   Lab Results  Component Value Date   LDLCALC 116 (H) 07/08/2017   Lab Results  Component Value Date   TRIG 77.0 07/08/2017   Lab Results  Component Value Date   CHOLHDL 4 07/08/2017   Lab Results  Component Value Date   PSA 1.86 07/08/2017   PSA 1.78 05/22/2016    IMPRESSION  AND PLAN:  1) HTN: The current medical regimen is effective;  continue present plan and medications. Lytes/cr today.  2) Mixed hyperlipidemia: tolerating fibrate. If LDL rising again on FLP today we may need to discuss adding statin.  3) Subclinical hypothyroidism: TSH very mildly elevated again about 2 wks ago at GI MD's office. Will recheck TSH today and check free T4 and T3 total as well as TPO ab's. He does not take biotin.  4) CRI with GFR @ 60 ml/min: avoids NSAIDs, hydrates well. Lytes/cr today.  An After Visit Summary was printed and given to the patient.  FOLLOW UP: Return in about 6 months (around 07/07/2018) for routine chronic illness f/u.  Signed:  Crissie Sickles, MD           01/06/2018

## 2018-01-07 ENCOUNTER — Encounter: Payer: Self-pay | Admitting: Family Medicine

## 2018-01-07 LAB — THYROID PEROXIDASE ANTIBODY: Thyroperoxidase Ab SerPl-aCnc: 155 IU/mL — ABNORMAL HIGH (ref <9)

## 2018-01-07 LAB — T3: T3, Total: 86 ng/dL (ref 76–181)

## 2018-01-10 NOTE — Telephone Encounter (Signed)
Please advise. Thanks.  

## 2018-01-10 NOTE — Telephone Encounter (Signed)
The thyroid levels are the "free T4" and "T3". -thx

## 2018-02-28 ENCOUNTER — Other Ambulatory Visit: Payer: Self-pay | Admitting: Family Medicine

## 2018-03-07 ENCOUNTER — Other Ambulatory Visit: Payer: Self-pay | Admitting: Family Medicine

## 2018-03-28 DIAGNOSIS — H25043 Posterior subcapsular polar age-related cataract, bilateral: Secondary | ICD-10-CM | POA: Diagnosis not present

## 2018-03-28 DIAGNOSIS — H25013 Cortical age-related cataract, bilateral: Secondary | ICD-10-CM | POA: Diagnosis not present

## 2018-03-28 DIAGNOSIS — H2513 Age-related nuclear cataract, bilateral: Secondary | ICD-10-CM | POA: Diagnosis not present

## 2018-03-28 DIAGNOSIS — H2511 Age-related nuclear cataract, right eye: Secondary | ICD-10-CM | POA: Diagnosis not present

## 2018-04-13 HISTORY — PX: CATARACT EXTRACTION: SUR2

## 2018-05-10 DIAGNOSIS — H25811 Combined forms of age-related cataract, right eye: Secondary | ICD-10-CM | POA: Diagnosis not present

## 2018-05-10 DIAGNOSIS — H2511 Age-related nuclear cataract, right eye: Secondary | ICD-10-CM | POA: Diagnosis not present

## 2018-05-11 DIAGNOSIS — H25042 Posterior subcapsular polar age-related cataract, left eye: Secondary | ICD-10-CM | POA: Diagnosis not present

## 2018-05-11 DIAGNOSIS — H25012 Cortical age-related cataract, left eye: Secondary | ICD-10-CM | POA: Diagnosis not present

## 2018-05-11 DIAGNOSIS — H2512 Age-related nuclear cataract, left eye: Secondary | ICD-10-CM | POA: Diagnosis not present

## 2018-05-17 DIAGNOSIS — H2512 Age-related nuclear cataract, left eye: Secondary | ICD-10-CM | POA: Diagnosis not present

## 2018-05-17 DIAGNOSIS — H2511 Age-related nuclear cataract, right eye: Secondary | ICD-10-CM | POA: Diagnosis not present

## 2018-05-17 DIAGNOSIS — H25812 Combined forms of age-related cataract, left eye: Secondary | ICD-10-CM | POA: Diagnosis not present

## 2018-07-07 ENCOUNTER — Ambulatory Visit: Payer: Medicare Other | Admitting: Family Medicine

## 2018-07-17 ENCOUNTER — Other Ambulatory Visit: Payer: Self-pay | Admitting: Family Medicine

## 2018-08-09 ENCOUNTER — Telehealth: Payer: Self-pay | Admitting: Family Medicine

## 2018-08-09 NOTE — Telephone Encounter (Signed)
Patient would like to have an in person visit. He would like to come in while we have a lab technician here so that his cholesterol and PSA can be checked.

## 2018-08-09 NOTE — Telephone Encounter (Signed)
Pt was called and is keeping appt on 08/16/18 and will do a doxy.me

## 2018-08-16 ENCOUNTER — Encounter: Payer: Self-pay | Admitting: Family Medicine

## 2018-08-16 ENCOUNTER — Other Ambulatory Visit: Payer: Self-pay

## 2018-08-16 ENCOUNTER — Ambulatory Visit (INDEPENDENT_AMBULATORY_CARE_PROVIDER_SITE_OTHER): Payer: Medicare Other | Admitting: Family Medicine

## 2018-08-16 DIAGNOSIS — E039 Hypothyroidism, unspecified: Secondary | ICD-10-CM | POA: Diagnosis not present

## 2018-08-16 DIAGNOSIS — E038 Other specified hypothyroidism: Secondary | ICD-10-CM

## 2018-08-16 DIAGNOSIS — Z125 Encounter for screening for malignant neoplasm of prostate: Secondary | ICD-10-CM

## 2018-08-16 DIAGNOSIS — N183 Chronic kidney disease, stage 3 unspecified: Secondary | ICD-10-CM

## 2018-08-16 DIAGNOSIS — I10 Essential (primary) hypertension: Secondary | ICD-10-CM | POA: Diagnosis not present

## 2018-08-16 DIAGNOSIS — E781 Pure hyperglyceridemia: Secondary | ICD-10-CM | POA: Diagnosis not present

## 2018-08-16 MED ORDER — FENOFIBRATE 54 MG PO TABS: 54.0000 mg | ORAL_TABLET | Freq: Every day | ORAL | 1 refills | Status: DC

## 2018-08-16 MED ORDER — HYDROCHLOROTHIAZIDE 25 MG PO TABS: ORAL_TABLET | ORAL | 1 refills | Status: DC

## 2018-08-16 NOTE — Progress Notes (Signed)
Virtual Visit via Video Note  I connected with pt on 08/16/18 at  8:20 AM EDT by a video enabled telemedicine application and verified that I am speaking with the correct person using two identifiers.  Location patient: home Location provider:work or home office Persons participating in the virtual visit: patient, provider  I discussed the limitations of evaluation and management by telemedicine and the availability of in person appointments. The patient expressed understanding and agreed to proceed.  Telemedicine visit is a necessity given the COVID-19 restrictions in place at the current time.  HPI: 73 y/o WM being seen today for 6 mo f/u HTN, Hypertriglyceridemia, and CRI with GFR @ 60 ml/min. Additionally, he has subclinical hypothyroidism and we've been following his thyroid levels regularly.  Interim hx: Feeling fine, keeping busy despite covid hassles.  HTN:  No home bp monitoring "b/c it's always been good since being on the fosinopril".  HLD: taking fenofibrate, no side effects noted. He tries to eat a low fat, low carb diet. No formal exercise but is NOT sedentary.  CRI: avoids NSAIDs.  Hydrates well.   ROS: no CP, no SOB, no wheezing, no cough, no dizziness, no HAs, no rashes, no melena/hematochezia.  No polyuria or polydipsia.  No myalgias or arthralgias.   Past Medical History:  Diagnosis Date  . Barrett's esophagus    Dr. Collene Mares  . Chronic renal insufficiency, stage 2 (mild) 2017   Stage II/III (GFR 60 ml/min)  . GERD (gastroesophageal reflux disease)   . History of adenomatous polyp of colon 09/2007   Polyps at 1st screening colonoscopy; 2014 no polyps.  Rpt 12/29/17: adenomatous polyp-->  Recall 5 yrs.  . Hyperlipemia, mixed    Pt intol of welchol; fenofibrate helpful  . Hypertension   . Subclinical hypothyroidism    per old PCP records.  TPO ab's elevated mildly 12/2017, with TSH 6.6 and normal T4 and T3==>developing Hashimoto's.  . Thrombocytopenia ([place])     Hx of, mild/stable with monitoring per old PCP records    Past Surgical History:  Procedure Laterality Date  . APPENDECTOMY    . COLONOSCOPY  12/29/2017   polypectomy --path - adenoma,  Repeat in 5 years  . COLONOSCOPY W/ POLYPECTOMY  2014   per Dr. Collene Mares, recall 02/2023  . ESOPHAGOGASTRODUODENOSCOPY  2014; 2016; 11/16/16   2018--Barrett's esoph reconfirmed    Family History  Problem Relation Age of Onset  . Hypertension Mother   . Hyperlipidemia Mother   . Arthritis Father   . 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  No T/A/Ds.   Current Outpatient Medications:  .  aspirin EC 81 MG tablet, Take 81 mg by mouth every morning., Disp: , Rfl:  .  fenofibrate 54 MG tablet, TAKE 1 TABLET DAILY, Disp: 90 tablet, Rfl: 1 .  fosinopril (MONOPRIL) 20 MG tablet, TAKE 1 TABLET EVERY MORNING, Disp: 90 tablet, Rfl: 1 .  hydrochlorothiazide (HYDRODIURIL) 25 MG tablet, TAKE ONE-HALF (1/2) TABLET EVERY MORNING, Disp: 30 tablet, Rfl: 0 .  omeprazole (PRILOSEC) 40 MG capsule, Take 1 capsule by mouth daily., Disp: , Rfl:   EXAM:  VITALS per patient if applicable:  GENERAL: alert, oriented, appears well and in no acute distress  HEENT: atraumatic, conjunttiva clear, no obvious abnormalities on inspection of external nose and ears  NECK: normal movements of the head and neck  LUNGS: on inspection no signs of respiratory distress, breathing rate appears normal, no obvious gross SOB,  gasping or wheezing  CV: no obvious cyanosis  MS: moves all visible extremities without noticeable abnormality  PSYCH/NEURO: pleasant and cooperative, no obvious depression or anxiety, speech and thought processing grossly intact  LABS: none today  Lab Results  Component Value Date   TSH 6.60 (H) 01/06/2018   Lab Results  Component Value Date   WBC 6.7 08/27/2012   HGB 14.9 08/27/2012   HCT 43.1 08/27/2012   MCV 82.9 08/27/2012   PLT 170 08/27/2012    Lab Results  Component Value Date   CREATININE 1.17 01/06/2018   BUN 21 01/06/2018   NA 136 01/06/2018   K 4.0 01/06/2018   CL 102 01/06/2018   CO2 25 01/06/2018   Lab Results  Component Value Date   ALT 22 07/08/2017   AST 31 07/08/2017   ALKPHOS 54 07/08/2017   BILITOT 0.5 07/08/2017   Lab Results  Component Value Date   CHOL 177 01/06/2018   Lab Results  Component Value Date   HDL 38.00 (L) 01/06/2018   Lab Results  Component Value Date   LDLCALC 110 (H) 01/06/2018   Lab Results  Component Value Date   TRIG 144.0 01/06/2018   Lab Results  Component Value Date   CHOLHDL 5 01/06/2018   Lab Results  Component Value Date   PSA 1.86 07/08/2017   PSA 1.78 05/22/2016    ASSESSMENT AND PLAN:  Discussed the following assessment and plan:  1) HTN: The current medical regimen is effective;  continue present plan and medications. Lytes/cr future.  2) Hypercholesterolemia: watching LDL, no statin indicated as of yet. Tolerating fenofibrate for trigs. FLP and hepatic panel-future.  3) Subclinical hypothyroidism: monitor TSH and T4/T3.  4) CRI w/GFR @ 60 ml/min: avoids NSAIDs.  Hydrates well. Lytes/cr--future.  5) Prostate ca screening: PSA--future.  I discussed the assessment and treatment plan with the patient. The patient was provided an opportunity to ask questions and all were answered. The patient agreed with the plan and demonstrated an understanding of the instructions.   The patient was advised to call back or seek an in-person evaluation if the symptoms worsen or if the condition fails to improve as anticipated.  F/u: 6 mo RCI  Signed:  Crissie Sickles, MD           08/16/2018

## 2018-08-19 ENCOUNTER — Other Ambulatory Visit (INDEPENDENT_AMBULATORY_CARE_PROVIDER_SITE_OTHER): Payer: Medicare Other

## 2018-08-19 DIAGNOSIS — I10 Essential (primary) hypertension: Secondary | ICD-10-CM | POA: Diagnosis not present

## 2018-08-19 DIAGNOSIS — Z125 Encounter for screening for malignant neoplasm of prostate: Secondary | ICD-10-CM | POA: Diagnosis not present

## 2018-08-19 DIAGNOSIS — E038 Other specified hypothyroidism: Secondary | ICD-10-CM

## 2018-08-19 DIAGNOSIS — E039 Hypothyroidism, unspecified: Secondary | ICD-10-CM

## 2018-08-19 DIAGNOSIS — E781 Pure hyperglyceridemia: Secondary | ICD-10-CM | POA: Diagnosis not present

## 2018-08-19 LAB — COMPREHENSIVE METABOLIC PANEL
ALT: 22 U/L (ref 0–53)
AST: 30 U/L (ref 0–37)
Albumin: 4.6 g/dL (ref 3.5–5.2)
Alkaline Phosphatase: 59 U/L (ref 39–117)
BUN: 21 mg/dL (ref 6–23)
CO2: 27 mEq/L (ref 19–32)
Calcium: 9.7 mg/dL (ref 8.4–10.5)
Chloride: 103 mEq/L (ref 96–112)
Creatinine, Ser: 1.18 mg/dL (ref 0.40–1.50)
GFR: 60.8 mL/min (ref >60.00)
Glucose, Bld: 81 mg/dL (ref 70–99)
Potassium: 4 mEq/L (ref 3.5–5.1)
Sodium: 137 mEq/L (ref 135–145)
Total Bilirubin: 0.6 mg/dL (ref 0.2–1.2)
Total Protein: 7.5 g/dL (ref 6.0–8.3)

## 2018-08-19 LAB — TSH: TSH: 9.65 u[IU]/mL — ABNORMAL HIGH (ref 0.35–4.50)

## 2018-08-19 LAB — LIPID PANEL
Cholesterol: 212 mg/dL — ABNORMAL HIGH (ref 0–200)
HDL: 45 mg/dL (ref >39.00)
LDL Cholesterol: 145 mg/dL — ABNORMAL HIGH (ref 0–99)
NonHDL: 166.51
Total CHOL/HDL Ratio: 5
Triglycerides: 109 mg/dL (ref 0.0–149.0)
VLDL: 21.8 mg/dL (ref 0.0–40.0)

## 2018-08-19 LAB — PSA, MEDICARE: PSA: 1.84 ng/ml (ref 0.10–4.00)

## 2018-08-19 LAB — T4, FREE: Free T4: 0.79 ng/dL (ref 0.60–1.60)

## 2018-08-20 LAB — T3: T3, Total: 91 ng/dL (ref 76–181)

## 2018-08-22 ENCOUNTER — Telehealth: Payer: Self-pay

## 2018-08-22 ENCOUNTER — Encounter: Payer: Self-pay | Admitting: Family Medicine

## 2018-08-22 NOTE — Telephone Encounter (Signed)
Pt was called and given information and lab results. Pt wanted it sent via my chart and he stated he would think about it and research the medication because Statins are "bad for you". Sent my chart message.

## 2018-08-22 NOTE — Telephone Encounter (Signed)
-----   Message from Tammi Sou, MD sent at 08/22/2018  2:09 PM EDT ----- All labs stable except his LDL, aka "bad cholesterol" is elevated. I recommend he add on a med to lower this and to help decrease his risk of cardiovascular disease and stroke. He would continue to take all his other current meds. If pt agreeable, pls eRx atorvastatin 20mg , 1 tab po qd, #30, rFx 2, needs lab visit for FLP in 2-3 mo, dx is mixed hyperlipidemia.-thx

## 2018-08-23 ENCOUNTER — Encounter: Payer: Self-pay | Admitting: Family Medicine

## 2018-08-23 ENCOUNTER — Other Ambulatory Visit: Payer: Self-pay | Admitting: Family Medicine

## 2018-08-23 DIAGNOSIS — E782 Mixed hyperlipidemia: Secondary | ICD-10-CM

## 2018-08-23 MED ORDER — ATORVASTATIN CALCIUM 10 MG PO TABS: 10.0000 mg | ORAL_TABLET | Freq: Every day | ORAL | 1 refills | Status: DC

## 2018-08-23 MED ORDER — FENOFIBRATE 145 MG PO TABS: 145.0000 mg | ORAL_TABLET | Freq: Every day | ORAL | 1 refills | Status: DC

## 2018-08-23 NOTE — Telephone Encounter (Signed)
That's fine. I think the increased dose of fenofibrate plus 10mg  of atorvastatin is a fine plan. I'll send in meds now. Needs lab visit in 2-24mo for recheck lipid panel. I'll enter orders.-thx

## 2018-08-23 NOTE — Telephone Encounter (Signed)
My Chart message from patient: I have read that the normal starting dose is either 10 mg or 20 mg. Since I am only slightly over 200 can we consider increasing the fenofibrate in conjunction with the lowest dose of Atorvastatin (10 mg)? My wife is taking fenofibrate at 145 mg but I am only 54 mg. There are 8 or 9 side effects for statins the most common of which is leg muscle pain. They range from pain to liver and kidney damage. I am pretty resilient when it comes to medication because I am so active, but I would like to take statins slowly.  Please advise

## 2018-08-27 ENCOUNTER — Other Ambulatory Visit: Payer: Self-pay | Admitting: Family Medicine

## 2018-11-10 NOTE — Progress Notes (Signed)
Subjective:   Shawn [name] is a 73 y.o. male who presents for Medicare Annual/Subsequent preventive examination.  Review of Systems:  No ROS.  Medicare Wellness Virtual Visit.  Visual/audio telehealth visit, UTA vital signs.   See social history for additional risk factors.  Cardiac Risk Factors include: advanced age (>45men, >56 women);dyslipidemia;male gender;hypertension;obesity (BMI >30kg/m2);family history of premature cardiovascular disease   Sleep patterns: Sleeps 8 hours, up every 2 hours.  Home Safety/Smoke Alarms: Feels safe in home. Smoke alarms in place.  Living environment; residence and Firearm Safety: Lives with wife and 71 yo granddaughter in 74 story home. Steps with rail. Seat Belt Safety/Bike Helmet: Wears seat belt.   Male:   CCS-Colonoscopy, 12/29/2017, polyp. Recall 5 years        PSA-  Lab Results  Component Value Date   PSA 1.84 08/19/2018   PSA 1.86 07/08/2017   PSA 1.78 05/22/2016       Objective:    Vitals: There were no vitals taken for this visit.  There is no height or weight on file to calculate BMI.  Advanced Directives 11/11/2018 11/05/2017 06/05/2016  Does Patient Have a Medical Advance Directive? Yes Yes No  Type of Advance Directive Living will;Healthcare Power of [place];Living will -  Does patient want to make changes to medical advance directive? - - No - Patient declined  Copy of [place] in Chart? - No - copy requested -    Tobacco Social History   Tobacco Use  Smoking Status Never Smoker  Smokeless Tobacco Never Used     Counseling given: Not Answered    Past Medical History:  Diagnosis Date  . Barrett's esophagus    Dr. Collene Mares  . Chronic renal insufficiency, stage 2 (mild) 2017   Stage II/III (GFR 60 ml/min)  . GERD (gastroesophageal reflux disease)   . History of adenomatous polyp of colon 09/2007   Polyps at 1st screening colonoscopy; 2014 no polyps.  Rpt 12/29/17:  adenomatous polyp-->  Recall 5 yrs.  . Hyperlipemia, mixed    Pt intol of welchol; fenofibrate helpful.  Fenofib inc to 145 and atorva 10mg  started 08/23/18.  Marland Kitchen Hypertension   . Subclinical hypothyroidism    per old PCP records.  TPO ab's elevated mildly 12/2017, with TSH 6.6 and normal T4 and T3==>developing Hashimoto's.  . Thrombocytopenia ([place])    Hx of, mild/stable with monitoring per old PCP records   Past Surgical History:  Procedure Laterality Date  . APPENDECTOMY    . COLONOSCOPY  12/29/2017   polypectomy --path - adenoma,  Repeat in 5 years  . COLONOSCOPY W/ POLYPECTOMY  2014   per Dr. Collene Mares, recall 02/2023  . ESOPHAGOGASTRODUODENOSCOPY  2014; 2016; 11/16/16   2018--Barrett's esoph reconfirmed   Family History  Problem Relation Age of Onset  . Hypertension Mother   . Hyperlipidemia Mother   . Arthritis Father   . Diabetes Paternal Grandmother 67   Social History   Socioeconomic History  . Marital status: Married    Spouse name: Not on file  . Number of children: Not on file  . Years of education: Not on file  . Highest education level: Not on file  Occupational History  . Not on file  Social Needs  . Financial resource strain: Not on file  . Food insecurity    Worry: Not on file    Inability: Not on file  . Transportation needs    Medical: Not on file  Non-medical: Not on file  Tobacco Use  . Smoking status: Never Smoker  . Smokeless tobacco: Never Used  Substance and Sexual Activity  . Alcohol use: No  . Drug use: No  . Sexual activity: Not on file  Lifestyle  . Physical activity    Days per week: Not on file    Minutes per session: Not on file  . Stress: Not on file  Relationships  . Social Herbalist on phone: Not on file    Gets together: Not on file    Attends religious service: Not on file    Active member of club or organization: Not on file    Attends meetings of clubs or organizations: Not on file    Relationship status: Not on  file  Other Topics Concern  . Not on file  Social History Narrative   Married, grand-daughter lives with him.   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   Orig from [place] and [place].   No T/A/Ds.   Exercises 3 X/week--walking 2-3 miles.    Outpatient Encounter Medications as of 11/11/2018  Medication Sig  . aspirin EC 81 MG tablet Take 81 mg by mouth every morning.  Marland Kitchen atorvastatin (LIPITOR) 10 MG tablet Take 1 tablet (10 mg total) by mouth daily.  . fenofibrate (TRICOR) 145 MG tablet Take 1 tablet (145 mg total) by mouth daily.  . fosinopril (MONOPRIL) 20 MG tablet TAKE 1 TABLET EVERY MORNING  . hydrochlorothiazide (HYDRODIURIL) 25 MG tablet 1 tab po qd  . omeprazole (PRILOSEC) 40 MG capsule Take 1 capsule by mouth daily.  Vladimir Faster Glycol-Propyl Glycol (SYSTANE OP) Apply to eye.   No facility-administered encounter medications on file as of 11/11/2018.     Activities of Daily Living In your present state of health, do you have any difficulty performing the following activities: 11/11/2018  Hearing? N  Vision? N  Difficulty concentrating or making decisions? N  Walking or climbing stairs? N  Dressing or bathing? N  Doing errands, shopping? N  Preparing Food and eating ? N  Using the Toilet? N  Do you have problems with loss of bowel control? N  Managing your Medications? N  Managing your Finances? N  Housekeeping or managing your Housekeeping? N  Some recent data might be hidden    Patient Care Team: Tammi Sou, MD as PCP - General (Family Medicine) Juanita Craver, MD as Consulting Physician (Gastroenterology) Francee Gentile, [place] as Referring Physician (Chiropractic Medicine) Romero Belling, MD as Referring Physician (Dermatology)   Assessment:   This is a routine wellness examination for [facility].  Exercise Activities and Dietary recommendations Current Exercise Habits: The patient does not participate in regular exercise at  present(Maintains yard; active outdoor activity), Exercise limited by: None identified   Diet (meal preparation, eat out, water intake, caffeinated beverages, dairy products, fruits and vegetables): Drinks gatorade, water with meds and coffee  Eats heart healthy meals. Avoids red meats. 3 meals/day.   Goals      Patient Stated   . <enter goal here> (pt-stated)     Maintain current health status by remaining active.       Other   . Patient Stated     Maintain current health by staying active.     . Patient Stated     Maintain current health by staying active. .       Fall Risk Fall Risk  11/11/2018 08/16/2018 11/05/2017 07/08/2017 06/05/2016  Falls in the past year? 0 0 Yes Yes No  Comment - - falling off bike, torn tendons/ligaments - -  Number falls in past yr: 0 - 1 2 or more -  Injury with Fall? 0 0 Yes No -  Comment - - - Pt stated that he tripped while working outside a few times -  Follow up Falls prevention discussed Falls evaluation completed Falls prevention discussed - -    Depression Screen PHQ 2/9 Scores 11/11/2018 08/16/2018 11/05/2017 07/08/2017  PHQ - 2 Score 0 0 0 0    Cognitive Function MMSE - Mini Mental State Exam 11/11/2018  Orientation to time 5  Orientation to Place 5  Registration 3  Attention/ Calculation 5  Recall 3  Language- name 2 objects 2  Language- repeat 1  Language- follow 3 step command 3  Language- read & follow direction 1  Write a sentence 1  Copy design 1  Total score 30        Immunization History  Administered Date(s) Administered  . Influenza, Quadrivalent, Recombinant, Inj, Pf 01/06/2018  . Influenza-Unspecified 12/05/2014, 01/12/2016, 12/14/2016  . Pneumococcal Conjugate-13 10/26/2014  . Pneumococcal Polysaccharide-23 08/31/2012  . Tdap 05/17/2006   Declines Shingrix  Screening Tests Health Maintenance  Topic Date Due  . TETANUS/TDAP  05/17/2016  . INFLUENZA VACCINE  11/12/2018  . COLONOSCOPY  12/30/2022  . PNA vac  Low Risk Adult  Completed  . Hepatitis C Screening  Discontinued        Plan:    Continue doing brain stimulating activities (puzzles, reading, adult coloring books, staying active) to keep memory sharp.   Bring a copy of your living will and/or healthcare power of attorney to your next office visit.  I have personally reviewed and noted the following in the patient's chart:   . Medical and social history . Use of alcohol, tobacco or illicit drugs  . Current medications and supplements . Functional ability and status . Nutritional status . Physical activity . Advanced directives . List of other physicians . Hospitalizations, surgeries, and ER visits in previous 12 months . Vitals . Screenings to include cognitive, depression, and falls . Referrals and appointments  In addition, I have reviewed and discussed with patient certain preventive protocols, quality metrics, and best practice recommendations. A written personalized care plan for preventive services as well as general preventive health recommendations were provided to patient.     Gerilyn Nestle, RN  11/11/2018  F/U with PCP 02/2019

## 2018-11-11 ENCOUNTER — Ambulatory Visit (INDEPENDENT_AMBULATORY_CARE_PROVIDER_SITE_OTHER): Payer: Medicare Other

## 2018-11-11 ENCOUNTER — Other Ambulatory Visit: Payer: Self-pay

## 2018-11-11 DIAGNOSIS — Z0000 Encounter for general adult medical examination without abnormal findings: Secondary | ICD-10-CM

## 2018-11-11 DIAGNOSIS — E669 Obesity, unspecified: Secondary | ICD-10-CM

## 2018-11-11 NOTE — Patient Instructions (Addendum)
Continue doing brain stimulating activities (puzzles, reading, adult coloring books, staying active) to keep memory sharp.   Bring a copy of your living will and/or healthcare power of attorney to your next office visit.   Fall Prevention in the Home, Adult Falls can cause injuries. They can happen to people of all ages. There are many things you can do to make your home safe and to help prevent falls. Ask for help when making these changes, if needed. What actions can I take to prevent falls? General Instructions  Use good lighting in all rooms. Replace any light bulbs that burn out.  Turn on the lights when you go into a dark area. Use night-lights.  Keep items that you use often in easy-to-reach places. Lower the shelves around your home if necessary.  Set up your furniture so you have a clear path. Avoid moving your furniture around.  Do not have throw rugs and other things on the floor that can make you trip.  Avoid walking on wet floors.  If any of your floors are uneven, fix them.  Add color or contrast paint or tape to clearly mark and help you see: ? Any grab bars or handrails. ? First and last steps of stairways. ? Where the edge of each step is.  If you use a stepladder: ? Make sure that it is fully opened. Do not climb a closed stepladder. ? Make sure that both sides of the stepladder are locked into place. ? Ask someone to hold the stepladder for you while you use it.  If there are any pets around you, be aware of where they are. What can I do in the bathroom?      Keep the floor dry. Clean up any water that spills onto the floor as soon as it happens.  Remove soap buildup in the tub or shower regularly.  Use non-skid mats or decals on the floor of the tub or shower.  Attach bath mats securely with double-sided, non-slip rug tape.  If you need to sit down in the shower, use a plastic, non-slip stool.  Install grab bars by the toilet and in the tub and  shower. Do not use towel bars as grab bars. What can I do in the bedroom?  Make sure that you have a light by your bed that is easy to reach.  Do not use any sheets or blankets that are too big for your bed. They should not hang down onto the floor.  Have a firm chair that has side arms. You can use this for support while you get dressed. What can I do in the kitchen?  Clean up any spills right away.  If you need to reach something above you, use a strong step stool that has a grab bar.  Keep electrical cords out of the way.  Do not use floor polish or wax that makes floors slippery. If you must use wax, use non-skid floor wax. What can I do with my stairs?  Do not leave any items on the stairs.  Make sure that you have a light switch at the top of the stairs and the bottom of the stairs. If you do not have them, ask someone to add them for you.  Make sure that there are handrails on both sides of the stairs, and use them. Fix handrails that are broken or loose. Make sure that handrails are as long as the stairways.  Install non-slip stair treads on all  stairs in your home.  Avoid having throw rugs at the top or bottom of the stairs. If you do have throw rugs, attach them to the floor with carpet tape.  Choose a carpet that does not hide the edge of the steps on the stairway.  Check any carpeting to make sure that it is firmly attached to the stairs. Fix any carpet that is loose or worn. What can I do on the outside of my home?  Use bright outdoor lighting.  Regularly fix the edges of walkways and driveways and fix any cracks.  Remove anything that might make you trip as you walk through a door, such as a raised step or threshold.  Trim any bushes or trees on the path to your home.  Regularly check to see if handrails are loose or broken. Make sure that both sides of any steps have handrails.  Install guardrails along the edges of any raised decks and porches.  Clear  walking paths of anything that might make someone trip, such as tools or rocks.  Have any leaves, snow, or ice cleared regularly.  Use sand or salt on walking paths during winter.  Clean up any spills in your garage right away. This includes grease or oil spills. What other actions can I take?  Wear shoes that: ? Have a low heel. Do not wear high heels. ? Have rubber bottoms. ? Are comfortable and fit you well. ? Are closed at the toe. Do not wear open-toe sandals.  Use tools that help you move around (mobility aids) if they are needed. These include: ? Canes. ? Walkers. ? Scooters. ? Crutches.  Review your medicines with your doctor. Some medicines can make you feel dizzy. This can increase your chance of falling. Ask your doctor what other things you can do to help prevent falls. Where to find more information  Centers for Disease Control and Prevention, STEADI: https://garcia.biz/  [employer] on Aging: BrainJudge.co.uk Contact a doctor if:  You are afraid of falling at home.  You feel weak, drowsy, or dizzy at home.  You fall at home. Summary  There are many simple things that you can do to make your home safe and to help prevent falls.  Ways to make your home safe include removing tripping hazards and installing grab bars in the bathroom.  Ask for help when making these changes in your home. This information is not intended to replace advice given to you by your health care provider. Make sure you discuss any questions you have with your health care provider. Document Released: 01/24/2009 Document Revised: 07/21/2018 Document Reviewed: 11/12/2016 Elsevier Patient Education  2020 [place] Maintenance, Male Adopting a healthy lifestyle and getting preventive care are important in promoting health and wellness. Ask your health care provider about:  The right schedule for you to have regular tests and exams.  Things you can do on your  own to prevent diseases and keep yourself healthy. What should I know about diet, weight, and exercise? Eat a healthy diet   Eat a diet that includes plenty of vegetables, fruits, low-fat dairy products, and lean protein.  Do not eat a lot of foods that are high in solid fats, added sugars, or sodium. Maintain a healthy weight Body mass index (BMI) is a measurement that can be used to identify possible weight problems. It estimates body fat based on height and weight. Your health care provider can help determine your BMI and help you  achieve or maintain a healthy weight. Get regular exercise Get regular exercise. This is one of the most important things you can do for your health. Most adults should:  Exercise for at least 150 minutes each week. The exercise should increase your heart rate and make you sweat (moderate-intensity exercise).  Do strengthening exercises at least twice a week. This is in addition to the moderate-intensity exercise.  Spend less time sitting. Even light physical activity can be beneficial. Watch cholesterol and blood lipids Have your blood tested for lipids and cholesterol at 71 years of age, then have this test every 5 years. You may need to have your cholesterol levels checked more often if:  Your lipid or cholesterol levels are high.  You are older than 71 years of age.  You are at high risk for heart disease. What should I know about cancer screening? Many types of cancers can be detected early and may often be prevented. Depending on your health history and family history, you may need to have cancer screening at various ages. This may include screening for:  Colorectal cancer.  Prostate cancer.  Skin cancer.  Lung cancer. What should I know about heart disease, diabetes, and high blood pressure? Blood pressure and heart disease  High blood pressure causes heart disease and increases the risk of stroke. This is more likely to develop in people  who have high blood pressure readings, are of African descent, or are overweight.  Talk with your health care provider about your target blood pressure readings.  Have your blood pressure checked: ? Every 3-5 years if you are 31-69 years of age. ? Every year if you are 76 years old or older.  If you are between the ages of 88 and 74 and are a current or former smoker, ask your health care provider if you should have a one-time screening for abdominal aortic aneurysm (AAA). Diabetes Have regular diabetes screenings. This checks your fasting blood sugar level. Have the screening done:  Once every three years after age 40 if you are at a normal weight and have a low risk for diabetes.  More often and at a younger age if you are overweight or have a high risk for diabetes. What should I know about preventing infection? Hepatitis B If you have a higher risk for hepatitis B, you should be screened for this virus. Talk with your health care provider to find out if you are at risk for hepatitis B infection. Hepatitis C Blood testing is recommended for:  Everyone born from [birth year] through 1963.  Anyone with known risk factors for hepatitis C. Sexually transmitted infections (STIs)  You should be screened each year for STIs, including gonorrhea and chlamydia, if: ? You are sexually active and are younger than 71 years of age. ? You are older than 71 years of age and your health care provider tells you that you are at risk for this type of infection. ? Your sexual activity has changed since you were last screened, and you are at increased risk for chlamydia or gonorrhea. Ask your health care provider if you are at risk.  Ask your health care provider about whether you are at high risk for HIV. Your health care provider may recommend a prescription medicine to help prevent HIV infection. If you choose to take medicine to prevent HIV, you should first get tested for HIV. You should then be tested  every 3 months for as long as you are taking the medicine.  Follow these instructions at home: Lifestyle  Do not use any products that contain nicotine or tobacco, such as cigarettes, e-cigarettes, and chewing tobacco. If you need help quitting, ask your health care provider.  Do not use street drugs.  Do not share needles.  Ask your health care provider for help if you need support or information about quitting drugs. Alcohol use  Do not drink alcohol if your health care provider tells you not to drink.  If you drink alcohol: ? Limit how much you have to 0-2 drinks a day. ? Be aware of how much alcohol is in your drink. In the [place] one drink equals one 12 oz bottle of beer (355 mL), one 5 oz glass of wine (148 mL), or one 1 oz glass of hard liquor (44 mL). General instructions  Schedule regular health, dental, and eye exams.  Stay current with your vaccines.  Tell your health care provider if: ? You often feel depressed. ? You have ever been abused or do not feel safe at home. Summary  Adopting a healthy lifestyle and getting preventive care are important in promoting health and wellness.  Follow your health care provider's instructions about healthy diet, exercising, and getting tested or screened for diseases.  Follow your health care provider's instructions on monitoring your cholesterol and blood pressure. This information is not intended to replace advice given to you by your health care provider. Make sure you discuss any questions you have with your health care provider. Document Released: 09/26/2007 Document Revised: 03/23/2018 Document Reviewed: 03/23/2018 Elsevier Patient Education  2020 [employer].

## 2018-11-11 NOTE — Progress Notes (Signed)
  I connected with patient 11/11/18 at  9:00 AM EDT by a video/audio enabled telemedicine application and verified that I am speaking with the correct person using two identifiers. Patient stated full name and DOB. Patient gave permission to continue with virtual visit. Patient's location was at home and Nurse's location was at [place] office.

## 2018-11-14 ENCOUNTER — Ambulatory Visit (INDEPENDENT_AMBULATORY_CARE_PROVIDER_SITE_OTHER): Payer: Medicare Other | Admitting: Family Medicine

## 2018-11-14 ENCOUNTER — Encounter: Payer: Self-pay | Admitting: Family Medicine

## 2018-11-14 ENCOUNTER — Other Ambulatory Visit: Payer: Self-pay

## 2018-11-14 DIAGNOSIS — E782 Mixed hyperlipidemia: Secondary | ICD-10-CM | POA: Diagnosis not present

## 2018-11-14 LAB — LIPID PANEL
Cholesterol: 159 mg/dL (ref 0–200)
HDL: 42.2 mg/dL (ref >39.00)
LDL Cholesterol: 93 mg/dL (ref 0–99)
NonHDL: 117.11
Total CHOL/HDL Ratio: 4
Triglycerides: 123 mg/dL (ref 0.0–149.0)
VLDL: 24.6 mg/dL (ref 0.0–40.0)

## 2018-11-14 LAB — HEPATIC FUNCTION PANEL
ALT: 20 U/L (ref 0–53)
AST: 26 U/L (ref 0–37)
Albumin: 4.7 g/dL (ref 3.5–5.2)
Alkaline Phosphatase: 46 U/L (ref 39–117)
Bilirubin, Direct: 0.1 mg/dL (ref 0.0–0.3)
Total Bilirubin: 0.5 mg/dL (ref 0.2–1.2)
Total Protein: 7.7 g/dL (ref 6.0–8.3)

## 2018-11-14 NOTE — Progress Notes (Signed)
AWV reviewed and agree. Signed:  Crissie Sickles, MD           11/14/2018

## 2018-12-06 DIAGNOSIS — E669 Obesity, unspecified: Secondary | ICD-10-CM | POA: Diagnosis not present

## 2018-12-06 DIAGNOSIS — K219 Gastro-esophageal reflux disease without esophagitis: Secondary | ICD-10-CM | POA: Diagnosis not present

## 2018-12-06 DIAGNOSIS — K439 Ventral hernia without obstruction or gangrene: Secondary | ICD-10-CM | POA: Diagnosis not present

## 2018-12-06 DIAGNOSIS — K2270 Barrett's esophagus without dysplasia: Secondary | ICD-10-CM | POA: Diagnosis not present

## 2018-12-13 DIAGNOSIS — R338 Other retention of urine: Secondary | ICD-10-CM

## 2018-12-13 HISTORY — DX: Other retention of urine: R33.8

## 2018-12-20 DIAGNOSIS — Z961 Presence of intraocular lens: Secondary | ICD-10-CM | POA: Diagnosis not present

## 2018-12-20 DIAGNOSIS — H43812 Vitreous degeneration, left eye: Secondary | ICD-10-CM | POA: Diagnosis not present

## 2019-01-05 ENCOUNTER — Emergency Department (HOSPITAL_COMMUNITY): Payer: Medicare Other

## 2019-01-05 ENCOUNTER — Encounter (HOSPITAL_COMMUNITY): Payer: Self-pay

## 2019-01-05 ENCOUNTER — Other Ambulatory Visit: Payer: Self-pay

## 2019-01-05 ENCOUNTER — Emergency Department (HOSPITAL_COMMUNITY)
Admission: EM | Admit: 2019-01-05 | Discharge: 2019-01-06 | Disposition: A | Discharge status: Home / Self Care | Payer: Medicare Other | Attending: Emergency Medicine | Admitting: Emergency Medicine

## 2019-01-05 DIAGNOSIS — E039 Hypothyroidism, unspecified: Secondary | ICD-10-CM | POA: Diagnosis not present

## 2019-01-05 DIAGNOSIS — R338 Other retention of urine: Secondary | ICD-10-CM | POA: Insufficient documentation

## 2019-01-05 DIAGNOSIS — Z79899 Other long term (current) drug therapy: Secondary | ICD-10-CM | POA: Diagnosis not present

## 2019-01-05 DIAGNOSIS — Z7982 Long term (current) use of aspirin: Secondary | ICD-10-CM | POA: Diagnosis not present

## 2019-01-05 DIAGNOSIS — I10 Essential (primary) hypertension: Secondary | ICD-10-CM | POA: Insufficient documentation

## 2019-01-05 DIAGNOSIS — R339 Retention of urine, unspecified: Secondary | ICD-10-CM | POA: Diagnosis not present

## 2019-01-05 DIAGNOSIS — R1030 Lower abdominal pain, unspecified: Secondary | ICD-10-CM | POA: Diagnosis not present

## 2019-01-05 LAB — URINALYSIS, ROUTINE W REFLEX MICROSCOPIC
Bilirubin Urine: NEGATIVE
Glucose, UA: NEGATIVE mg/dL
Hgb urine dipstick: NEGATIVE
Ketones, ur: NEGATIVE mg/dL
Leukocytes,Ua: NEGATIVE
Nitrite: NEGATIVE
Protein, ur: NEGATIVE mg/dL
Specific Gravity, Urine: 1.011 (ref 1.005–1.030)
pH: 6 (ref 5.0–8.0)

## 2019-01-05 MED ORDER — SENNOSIDES-DOCUSATE SODIUM 8.6-50 MG PO TABS: 2.0000 | ORAL_TABLET | Freq: Every day | ORAL | 0 refills | Status: DC

## 2019-01-05 NOTE — ED Notes (Signed)
Pt ambulated to the restroom with no assistance. Gait steady  

## 2019-01-05 NOTE — Discharge Instructions (Signed)
As discussed, you have been diagnosed with acute urinary retention. It is important that you follow-up with both your primary care physician and our urology colleagues.  Return here for concerning changes in your condition.

## 2019-01-05 NOTE — ED Provider Notes (Signed)
[facility] DEPT Provider Note   CSN: [id number] Arrival date & time: 01/05/19  1750     History   Chief Complaint Chief Complaint  Patient presents with   Constipation   Urinary Retention    HPI Shawn [name] is a 73 y.o. male.     HPI Patient presents with concern for diminished bowel movements and urinary production. Onset was today, initially the patient notes that last successful urination was about 12 hours prior to my evaluation, and his last bowel movement was 2 days ago. Bowel movements 2 days ago were soft, and there were several of them. Today, over the interval since his last urination he has had increasing abdominal discomfort in the lower belly, with sense of tenesmus. No vomiting, there is nausea, anorexia. No upper abdominal pain. No fever, no chills. Patient is a history of surgery in the distant past, but otherwise no history of urinary retention, nor bowel obstruction. Past Medical History:  Diagnosis Date   Barrett's esophagus    Dr. Collene Mares   Cataract    Chronic renal insufficiency, stage 2 (mild) 2017   Stage II/III (GFR 60 ml/min)   GERD (gastroesophageal reflux disease)    History of adenomatous polyp of colon 09/2007   Polyps at 1st screening colonoscopy; 2014 no polyps.  Rpt 12/29/17: adenomatous polyp-->  Recall 5 yrs.   Hyperlipemia, mixed    Pt intol of welchol; fenofibrate helpful.  Fenofib inc to 145 and atorva 10mg  started 08/23/18-->great lipid panel 11/2018.   Hypertension    Subclinical hypothyroidism    per old PCP records.  TPO ab's elevated mildly 12/2017, with TSH 6.6 and normal T4 and T3==>developing Hashimoto's.   Thrombocytopenia ([place])    Hx of, mild/stable with monitoring per old PCP records    Patient Active Problem List   Diagnosis Date Noted   Medicare annual wellness visit, subsequent 04/18/2015   Prostate cancer screening 04/17/2015   History of actinic keratoses 12/05/2013    Esophageal reflux 01/04/2013   Hypertension, benign 01/04/2013   Mixed hyperlipidemia 01/04/2013   Hypothyroidism 10/03/2012    Past Surgical History:  Procedure Laterality Date   APPENDECTOMY     CATARACT EXTRACTION Bilateral 04/13/2018   05/14/2018   COLONOSCOPY  12/29/2017   polypectomy --path - adenoma,  Repeat in 5 years   COLONOSCOPY W/ POLYPECTOMY  2014   per Dr. Collene Mares, recall 02/2023   ESOPHAGOGASTRODUODENOSCOPY  2014; 2016; 11/16/16   2018--Barrett's esoph reconfirmed        Home Medications    Prior to Admission medications   Medication Sig Start Date End Date Taking? Authorizing Provider  aspirin EC 81 MG tablet Take 81 mg by mouth every morning.   Yes [provider]  atorvastatin (LIPITOR) 10 MG tablet Take 1 tablet (10 mg total) by mouth daily. 08/23/18 08/23/19 Yes McGowen, Adrian Blackwater, MD  fenofibrate (TRICOR) 145 MG tablet Take 1 tablet (145 mg total) by mouth daily. 08/23/18  Yes McGowen, Adrian Blackwater, MD  fosinopril (MONOPRIL) 20 MG tablet TAKE 1 TABLET EVERY MORNING Patient taking differently: Take 20 mg by mouth daily.  08/29/18  Yes McGowen, Adrian Blackwater, MD  hydrochlorothiazide (HYDRODIURIL) 25 MG tablet 1 tab po qd Patient taking differently: Take 25 mg by mouth daily.  08/16/18  Yes McGowen, Adrian Blackwater, MD  omeprazole (PRILOSEC) 40 MG capsule Take 1 capsule by mouth daily. 06/13/16  Yes [provider]  Polyethyl Glycol-Propyl Glycol (SYSTANE OP) Apply to eye.  Yes [provider]    Family History Family History  Problem Relation Age of Onset   Hypertension Mother    Hyperlipidemia Mother    Arthritis Father    Diabetes Paternal Grandmother 105    Social History Social History   Tobacco Use   Smoking status: Never Smoker   Smokeless tobacco: Never Used  Substance Use Topics   Alcohol use: No   Drug use: No     Allergies   Patient has no known allergies.   Review of Systems Review of Systems  Constitutional:         Per HPI, otherwise negative  HENT:       Per HPI, otherwise negative  Respiratory:       Per HPI, otherwise negative  Cardiovascular:       Per HPI, otherwise negative  Gastrointestinal: Negative for vomiting.  Endocrine:       Negative aside from HPI  Genitourinary:       Neg aside from HPI   Musculoskeletal:       Per HPI, otherwise negative  Skin: Negative.   Neurological: Negative for syncope.     Physical Exam Updated Vital Signs BP (!) 128/91    Pulse 71    Temp 98.3 F (36.8 C) (Oral)    Resp 18    Ht 5' 10.5" (1.791 m)    Wt 99.8 kg    SpO2 94%    BMI 31.12 kg/m   Physical Exam Vitals signs and nursing note reviewed.  Constitutional:      Appearance: He is well-developed. He is ill-appearing and diaphoretic.     Comments: Uncomfortable appearing adult male awake and alert  HENT:     Head: Normocephalic and atraumatic.  Eyes:     Conjunctiva/sclera: Conjunctivae normal.  Cardiovascular:     Rate and Rhythm: Normal rate and regular rhythm.  Pulmonary:     Effort: Pulmonary effort is normal. No respiratory distress.     Breath sounds: No stridor.  Abdominal:     General: There is no distension.     Tenderness: There is abdominal tenderness. There is guarding.  Genitourinary:   Skin:    General: Skin is warm.  Neurological:     Mental Status: He is alert and oriented to person, place, and time.      ED Treatments / Results  Labs (all labs ordered are listed, but only abnormal results are displayed) Labs Reviewed  URINALYSIS, ROUTINE W REFLEX MICROSCOPIC    Radiology Dg Abd Acute 2+v W 1v Chest  Result Date: 01/05/2019 CLINICAL DATA:  Lower abdominal pain EXAM: DG ABDOMEN ACUTE W/ 1V CHEST COMPARISON:  Chest x-ray 08/27/2012 FINDINGS: Moderate stool burden throughout the colon. The bowel gas pattern is normal. There is no evidence of free intraperitoneal air. No suspicious radio-opaque calculi or other significant radiographic abnormality is  seen. Heart size and mediastinal contours are within normal limits. Both lungs are clear. IMPRESSION: Moderate stool burden.  No acute findings. Electronically Signed   By: Rolm Baptise M.D.   On: 01/05/2019 21:28    Procedures Procedures (including critical care time)  Medications Ordered in ED Medications - No data to display   Initial Impression / Assessment and Plan / ED Course  I have reviewed the triage vital signs and the nursing notes.  Pertinent labs & imaging results that were available during my care of the patient were reviewed by me and considered in my medical decision making (see chart  for details).    With concern for acute urinary retention given bedside ultrasound demonstrating 1 L of fluid in his bladder, the patient had catheter placed.    10:22 PM Patient now markedly better, catheter is been placed, approximately 1 L of urine is drained, and he is now sitting on the bed, with ability to have bowel movements. After slight increase in pain following placement of the catheter, following production of both urine and stool he has had substantial improvement. Patient remains hemodynamically unremarkable. If he remains improved, he is appropriate for discharge with outpatient urology follow-up. Some suspicion for acute urinary retention contributing to the patient's presentation, no evidence for other acute abdominal processes, x-ray generally reassuring aside from stool retention, which, as above has improved following placement of urinary catheter.  Final Clinical Impressions(s) / ED Diagnoses  Acute urinary retention.   Carmin Muskrat, MD 01/05/19 2225

## 2019-01-05 NOTE — ED Triage Notes (Signed)
Patient c/o constipation and urinary retention. Patient states last BM was 2 days ago. Patient reports he had supositiry today with no results. Patient states he last urinated at 1100 today.

## 2019-01-06 ENCOUNTER — Telehealth: Payer: Self-pay | Admitting: Family Medicine

## 2019-01-06 MED ORDER — DOCUSATE SODIUM 100 MG PO CAPS: 100.0000 mg | ORAL_CAPSULE | Freq: Two times a day (BID) | ORAL | 0 refills | Status: DC

## 2019-01-06 NOTE — Progress Notes (Signed)
Placed urinary catheter leg bag on patient and educated patient on how to use it. He verbalized understanding. He stated he had no further questions.

## 2019-01-06 NOTE — Telephone Encounter (Signed)
Please call patient regarding an overnight urine bag. (?) Patient was seen last night in ER for urine retention. ER doctor said for patient to call PCP to issue ON bag and to call [facility].  Patient can be reached at [phone]

## 2019-01-06 NOTE — Telephone Encounter (Signed)
ED records printed and placed on your desk. Please advise, thanks.

## 2019-01-09 NOTE — Telephone Encounter (Signed)
Patient states ER physician recommended he follow up with his PCP. Patient has an appt with [facility] on 01/11/19 with Dr. McDiarmid to address the urinary retention issue.  Patient is asking if he should just keep his November appt or should he come in earlier. Patient does not need overnight bags. He ordered them from [employer].

## 2019-01-09 NOTE — Telephone Encounter (Signed)
Please advise, thank you.

## 2019-01-09 NOTE — Telephone Encounter (Signed)
No visit here is needed. He should just keep his urology appt.-thx

## 2019-01-10 NOTE — Telephone Encounter (Signed)
Patient aware.

## 2019-01-11 DIAGNOSIS — R350 Frequency of micturition: Secondary | ICD-10-CM | POA: Diagnosis not present

## 2019-01-11 DIAGNOSIS — R3914 Feeling of incomplete bladder emptying: Secondary | ICD-10-CM | POA: Diagnosis not present

## 2019-01-18 DIAGNOSIS — R350 Frequency of micturition: Secondary | ICD-10-CM | POA: Diagnosis not present

## 2019-01-18 DIAGNOSIS — R3914 Feeling of incomplete bladder emptying: Secondary | ICD-10-CM | POA: Diagnosis not present

## 2019-01-18 DIAGNOSIS — R3912 Poor urinary stream: Secondary | ICD-10-CM | POA: Diagnosis not present

## 2019-02-01 ENCOUNTER — Other Ambulatory Visit: Payer: Self-pay | Admitting: Family Medicine

## 2019-02-10 ENCOUNTER — Other Ambulatory Visit: Payer: Self-pay

## 2019-02-10 ENCOUNTER — Encounter: Payer: Self-pay | Admitting: Family Medicine

## 2019-02-10 ENCOUNTER — Ambulatory Visit (INDEPENDENT_AMBULATORY_CARE_PROVIDER_SITE_OTHER): Payer: Medicare Other | Admitting: Family Medicine

## 2019-02-10 VITALS — BP 123/67 | HR 48 | Temp 98.0°F | Resp 16 | Ht 70.0 in | Wt 221.4 lb

## 2019-02-10 DIAGNOSIS — K5900 Constipation, unspecified: Secondary | ICD-10-CM

## 2019-02-10 DIAGNOSIS — I4949 Other premature depolarization: Secondary | ICD-10-CM

## 2019-02-10 DIAGNOSIS — R338 Other retention of urine: Secondary | ICD-10-CM

## 2019-02-10 DIAGNOSIS — E038 Other specified hypothyroidism: Secondary | ICD-10-CM

## 2019-02-10 DIAGNOSIS — E039 Hypothyroidism, unspecified: Secondary | ICD-10-CM | POA: Diagnosis not present

## 2019-02-10 DIAGNOSIS — N401 Enlarged prostate with lower urinary tract symptoms: Secondary | ICD-10-CM | POA: Diagnosis not present

## 2019-02-10 DIAGNOSIS — I10 Essential (primary) hypertension: Secondary | ICD-10-CM | POA: Diagnosis not present

## 2019-02-10 DIAGNOSIS — I491 Atrial premature depolarization: Secondary | ICD-10-CM

## 2019-02-10 NOTE — Progress Notes (Signed)
OFFICE VISIT  02/10/2019   CC:  Chief Complaint  Patient presents with  . Follow-up    RCI, pt is not fasting   HPI:    Patient is a 73 y.o. Caucasian male who presents for f/u acute urinary retention and constipation that he went to the ED for 01/05/19, and he also was found to have an abnormal EKG at pre-procedure testing for his colonoscopy (Dr. Collene Mares) today.  They cancelled the procedure today. His rhythm strip is available for review and it shows sinus rhythm with a couple of PACs, otherwise normal.  Denies any palpitations, racing heart, or feeling that his heart is beating abnormally at all. No dizziness, no CP, no SOB, no   Reviewed ED records: he had unknown cause of his acute urinary retention.  UA normal, acute abd series normal except moderate stool burden.  Cath put in and 1L urine drained and he felt back to normal.  Foley was left in and he f/u'd at [facility]ist ([facility] he doesn't recall name of provider), where the foley was removed and he passed void test. No records of urol visit avail for review at this time.   Urology eval led to rx of a med that he can't recall but he says he did not like the way it made him feel so he stopped it (? flomax). Has chronic urinary frequency, some obstructive sx's that sound mild. Hydrating well.  Constipation: he had LARGE evacuation after his bladder got drained with cath in the ED.  Past Medical History:  Diagnosis Date  . Barrett's esophagus    Dr. Collene Mares  . Cataract   . Chronic renal insufficiency, stage 2 (mild) 2017   Stage II/III (GFR 60 ml/min)  . GERD (gastroesophageal reflux disease)   . History of adenomatous polyp of colon 09/2007   Polyps at 1st screening colonoscopy; 2014 no polyps.  Rpt 12/29/17: adenomatous polyp-->  Recall 5 yrs.  . Hyperlipemia, mixed    Pt intol of welchol; fenofibrate helpful.  Fenofib inc to 145 and atorva 10mg  started 08/23/18-->great lipid panel 11/2018.  Marland Kitchen Hypertension   . Subclinical  hypothyroidism    per old PCP records.  TPO ab's elevated mildly 12/2017, with TSH 6.6 and normal T4 and T3==>developing Hashimoto's.  . Thrombocytopenia ([place])    Hx of, mild/stable with monitoring per old PCP records    Past Surgical History:  Procedure Laterality Date  . APPENDECTOMY    . CATARACT EXTRACTION Bilateral 04/13/2018   05/14/2018  . COLONOSCOPY  12/29/2017   polypectomy --path - adenoma,  Repeat in 5 years  . COLONOSCOPY W/ POLYPECTOMY  2014   per Dr. Collene Mares, recall 02/2023  . ESOPHAGOGASTRODUODENOSCOPY  2014; 2016; 11/16/16   2018--Barrett's esoph reconfirmed    Outpatient Medications Prior to Visit  Medication Sig Dispense Refill  . aspirin EC 81 MG tablet Take 81 mg by mouth every morning.    Marland Kitchen atorvastatin (LIPITOR) 10 MG tablet TAKE 1 TABLET DAILY 90 tablet 1  . fosinopril (MONOPRIL) 20 MG tablet TAKE 1 TABLET EVERY MORNING (Patient taking differently: Take 20 mg by mouth daily. ) 90 tablet 3  . hydrochlorothiazide (HYDRODIURIL) 25 MG tablet 1 tab po qd (Patient taking differently: Take 25 mg by mouth daily. ) 90 tablet 1  . omeprazole (PRILOSEC) 40 MG capsule Take 1 capsule by mouth daily.    Vladimir Faster Glycol-Propyl Glycol (SYSTANE OP) Apply to eye.    . TRICOR 145 MG tablet TAKE 1 TABLET DAILY 90  tablet 1  . tamsulosin (FLOMAX) 0.4 MG CAPS capsule      No facility-administered medications prior to visit.     No Known Allergies  ROS As per HPI  PE: Blood pressure 123/67, pulse (!) 48, temperature 98 F (36.7 C), temperature source Temporal, resp. rate 16, height 5\' 10"  (1.778 m), weight 221 lb 6.4 oz (100.4 kg), SpO2 97 %. Gen: Alert, well appearing.  Patient is oriented to person, place, time, and situation. AFFECT: pleasant, lucid thought and speech. CV: Regular rhythm with an ectopic beat about every 5-7 beats, no m/r/g.  (rate 75 or so). LUNGS: CTA bilat, nonlabored resps, good aeration in all lung fields. EXT: no clubbing or cyanosis.  no edema.     LABS:    Chemistry      Component Value Date/Time   NA 137 08/19/2018 0836   K 4.0 08/19/2018 0836   CL 103 08/19/2018 0836   CO2 27 08/19/2018 0836   BUN 21 08/19/2018 0836   CREATININE 1.18 08/19/2018 0836      Component Value Date/Time   CALCIUM 9.7 08/19/2018 0836   ALKPHOS 46 11/14/2018 0757   AST 26 11/14/2018 0757   ALT 20 11/14/2018 0757   BILITOT 0.5 11/14/2018 0757     Lab Results  Component Value Date   CHOL 159 11/14/2018   HDL 42.20 11/14/2018   LDLCALC 93 11/14/2018   TRIG 123.0 11/14/2018   CHOLHDL 4 11/14/2018    Lab Results  Component Value Date   TSH 9.65 (H) 08/19/2018   T3TOTAL 91 08/19/2018    Lab Results  Component Value Date   WBC 6.7 08/27/2012   HGB 14.9 08/27/2012   HCT 43.1 08/27/2012   MCV 82.9 08/27/2012   PLT 170 08/27/2012   12 lead EKG today: sinus rhythm.  He has 3 PACs on his strip of 13 beats.  Normal intervals and duration.  NO ST/T changes, no hypertrophy. Compared to ekg 08/27/12 the PACs are new.  IMPRESSION AND PLAN:  No problem-specific Assessment & Plan notes found for this encounter.  1) PAC's: asymptomatic.  Fairly benign finding but it is new for him compared to prior EKG (6 yrs ago). I will check lytes, mag, and thyroid labs. If these are normal my inclination is to do no further w/u, but will run this by cardiology.  2) Acute urinary retention: BPH suspected. He feels fine with leaving his mild chronic BPH obstructive sx's alone at this time. Will get urology records.  3) Acute constipation: suspect he could empty rectum well due to the compressive effects of his urinary retention.  All is resolved and stool habits are normal.  4) HTN: The current medical regimen is effective;  continue present plan and medications. Lytes/cr today.  5) Subclinical hypothyroidism: I've chosen to hold off on starting T4 until his T4/T3 are low OR if TSH gets above 10 (whichever occurs first).  Also, see #1 above.  An  After Visit Summary was printed and given to the patient.  FOLLOW UP: Return for to be determined based on results of w/u.  Signed:  Crissie Sickles, MD           02/10/2019

## 2019-02-11 LAB — BASIC METABOLIC PANEL
BUN: 25 mg/dL (ref 7–25)
CO2: 24 mmol/L (ref 20–32)
Calcium: 9.9 mg/dL (ref 8.6–10.3)
Chloride: 102 mmol/L (ref 98–110)
Creat: 1.16 mg/dL (ref 0.70–1.18)
Glucose, Bld: 94 mg/dL (ref 65–99)
Potassium: 4.3 mmol/L (ref 3.5–5.3)
Sodium: 133 mmol/L — ABNORMAL LOW (ref 135–146)

## 2019-02-11 LAB — T4, FREE: Free T4: 1.1 ng/dL (ref 0.8–1.8)

## 2019-02-11 LAB — TSH: TSH: 5.93 mIU/L — ABNORMAL HIGH (ref 0.40–4.50)

## 2019-02-11 LAB — MAGNESIUM: Magnesium: 2.3 mg/dL (ref 1.5–2.5)

## 2019-02-11 LAB — EXTRA LAV TOP TUBE

## 2019-02-11 LAB — T3: T3, Total: 84 ng/dL (ref 76–181)

## 2019-02-13 ENCOUNTER — Encounter: Payer: Self-pay | Admitting: Family Medicine

## 2019-02-13 ENCOUNTER — Other Ambulatory Visit: Payer: Self-pay | Admitting: Family Medicine

## 2019-02-16 ENCOUNTER — Ambulatory Visit: Payer: Medicare Other | Admitting: Family Medicine

## 2019-02-16 DIAGNOSIS — R3912 Poor urinary stream: Secondary | ICD-10-CM | POA: Diagnosis not present

## 2019-02-16 DIAGNOSIS — R3914 Feeling of incomplete bladder emptying: Secondary | ICD-10-CM | POA: Diagnosis not present

## 2019-02-16 DIAGNOSIS — R350 Frequency of micturition: Secondary | ICD-10-CM | POA: Diagnosis not present

## 2019-03-03 ENCOUNTER — Encounter: Payer: Self-pay | Admitting: Family Medicine

## 2019-03-03 DIAGNOSIS — K2270 Barrett's esophagus without dysplasia: Secondary | ICD-10-CM | POA: Diagnosis not present

## 2019-03-03 DIAGNOSIS — K219 Gastro-esophageal reflux disease without esophagitis: Secondary | ICD-10-CM | POA: Diagnosis not present

## 2019-03-03 HISTORY — PX: UPPER GI ENDOSCOPY: SHX6162

## 2019-04-14 DIAGNOSIS — H903 Sensorineural hearing loss, bilateral: Secondary | ICD-10-CM

## 2019-04-14 HISTORY — DX: Sensorineural hearing loss, bilateral: H90.3

## 2019-05-21 ENCOUNTER — Ambulatory Visit: Payer: Medicare Other | Attending: Internal Medicine

## 2019-05-21 DIAGNOSIS — Z23 Encounter for immunization: Secondary | ICD-10-CM | POA: Insufficient documentation

## 2019-05-21 NOTE — Progress Notes (Signed)
   Covid-19 Vaccination Clinic  Name:  Shawn [name]    MRN: [id number] DOB: [birth year]  05/21/2019  Shawn [name] was observed post Covid-19 immunization for 15 minutes without incidence. He was provided with Vaccine Information Sheet and instruction to access the V-Safe system.   Shawn [name] was instructed to call 911 with any severe reactions post vaccine: Marland Kitchen Difficulty breathing  . Swelling of your face and throat  . A fast heartbeat  . A bad rash all over your body  . Dizziness and weakness    Immunizations Administered    Name Date Dose VIS Date Route   Pfizer COVID-19 Vaccine 05/21/2019  5:29 PM 0.3 mL 03/24/2019 Intramuscular   Manufacturer: [place]   Lot: [id number]   [place]: [id number]

## 2019-06-11 ENCOUNTER — Ambulatory Visit: Payer: Medicare Other

## 2019-06-15 ENCOUNTER — Ambulatory Visit: Payer: Medicare Other | Attending: Internal Medicine

## 2019-06-15 DIAGNOSIS — Z23 Encounter for immunization: Secondary | ICD-10-CM | POA: Insufficient documentation

## 2019-06-15 NOTE — Progress Notes (Signed)
   Covid-19 Vaccination Clinic  Name:  MYA CASERTA    MRN: [id number] DOB: 11-Aug-1945  06/15/2019  Mr. Westland was observed post Covid-19 immunization for 15 minutes without incident. He was provided with Vaccine Information Sheet and instruction to access the V-Safe system.   Mr. Gutman was instructed to call 911 with any severe reactions post vaccine: Marland Kitchen Difficulty breathing  . Swelling of face and throat  . A fast heartbeat  . A bad rash all over body  . Dizziness and weakness   Immunizations Administered    Name Date Dose VIS Date Route   Pfizer COVID-19 Vaccine 06/15/2019  2:31 PM 0.3 mL 03/24/2019 Intramuscular   Manufacturer: [place]   Lot: [id number]   [place]: [id number]

## 2019-06-26 DIAGNOSIS — L821 Other seborrheic keratosis: Secondary | ICD-10-CM | POA: Diagnosis not present

## 2019-06-26 DIAGNOSIS — L814 Other melanin hyperpigmentation: Secondary | ICD-10-CM | POA: Diagnosis not present

## 2019-06-26 DIAGNOSIS — Z872 Personal history of diseases of the skin and subcutaneous tissue: Secondary | ICD-10-CM | POA: Diagnosis not present

## 2019-06-26 DIAGNOSIS — D229 Melanocytic nevi, unspecified: Secondary | ICD-10-CM | POA: Diagnosis not present

## 2019-07-31 ENCOUNTER — Other Ambulatory Visit: Payer: Self-pay | Admitting: Family Medicine

## 2019-08-22 ENCOUNTER — Other Ambulatory Visit: Payer: Self-pay | Admitting: Family Medicine

## 2019-10-29 ENCOUNTER — Other Ambulatory Visit: Payer: Self-pay | Admitting: Family Medicine

## 2019-11-09 ENCOUNTER — Telehealth: Payer: Self-pay

## 2019-11-09 NOTE — Telephone Encounter (Signed)
Patient is scheduled for a Medicare Wellness Visit 12/07/19. He would like to know when he should get his cholesterol checked again.

## 2019-11-09 NOTE — Telephone Encounter (Signed)
Pt was called and told to come fasting for his 12/07/2019 appt for labs to be drawn. Per chart pt has not had Lipid panel checked in one year.

## 2019-11-13 ENCOUNTER — Ambulatory Visit: Payer: Medicare Other

## 2019-11-24 ENCOUNTER — Other Ambulatory Visit: Payer: Self-pay | Admitting: Family Medicine

## 2019-11-24 NOTE — Telephone Encounter (Signed)
Rx sent for 6 month fill. Pt has enough to last a few weeks.

## 2019-12-06 ENCOUNTER — Telehealth: Payer: Self-pay

## 2019-12-06 NOTE — Telephone Encounter (Signed)
Patient would like to know if he has to fast for tomorrow's appointment.

## 2019-12-06 NOTE — Telephone Encounter (Signed)
Patient was advised to come fasting tomorrow.

## 2019-12-07 ENCOUNTER — Other Ambulatory Visit: Payer: Self-pay

## 2019-12-07 ENCOUNTER — Ambulatory Visit (INDEPENDENT_AMBULATORY_CARE_PROVIDER_SITE_OTHER): Payer: Medicare Other | Admitting: Family Medicine

## 2019-12-07 ENCOUNTER — Encounter: Payer: Self-pay | Admitting: Family Medicine

## 2019-12-07 VITALS — BP 128/78 | HR 64 | Temp 97.6°F | Resp 16 | Ht 70.0 in | Wt 217.2 lb

## 2019-12-07 DIAGNOSIS — I10 Essential (primary) hypertension: Secondary | ICD-10-CM | POA: Diagnosis not present

## 2019-12-07 DIAGNOSIS — E039 Hypothyroidism, unspecified: Secondary | ICD-10-CM

## 2019-12-07 DIAGNOSIS — Z0000 Encounter for general adult medical examination without abnormal findings: Secondary | ICD-10-CM

## 2019-12-07 DIAGNOSIS — Z125 Encounter for screening for malignant neoplasm of prostate: Secondary | ICD-10-CM

## 2019-12-07 DIAGNOSIS — E7800 Pure hypercholesterolemia, unspecified: Secondary | ICD-10-CM | POA: Diagnosis not present

## 2019-12-07 DIAGNOSIS — H9012 Conductive hearing loss, unilateral, left ear, with unrestricted hearing on the contralateral side: Secondary | ICD-10-CM

## 2019-12-07 DIAGNOSIS — E038 Other specified hypothyroidism: Secondary | ICD-10-CM

## 2019-12-07 LAB — COMPREHENSIVE METABOLIC PANEL
ALT: 19 U/L (ref 0–53)
AST: 27 U/L (ref 0–37)
Albumin: 4.7 g/dL (ref 3.5–5.2)
Alkaline Phosphatase: 48 U/L (ref 39–117)
BUN: 21 mg/dL (ref 6–23)
CO2: 27 mEq/L (ref 19–32)
Calcium: 10 mg/dL (ref 8.4–10.5)
Chloride: 102 mEq/L (ref 96–112)
Creatinine, Ser: 1.19 mg/dL (ref 0.40–1.50)
GFR: 59.99 mL/min — ABNORMAL LOW (ref >60.00)
Glucose, Bld: 84 mg/dL (ref 70–99)
Potassium: 4.1 mEq/L (ref 3.5–5.1)
Sodium: 136 mEq/L (ref 135–145)
Total Bilirubin: 0.6 mg/dL (ref 0.2–1.2)
Total Protein: 7.7 g/dL (ref 6.0–8.3)

## 2019-12-07 LAB — LIPID PANEL
Cholesterol: 155 mg/dL (ref 0–200)
HDL: 36.6 mg/dL — ABNORMAL LOW (ref >39.00)
LDL Cholesterol: 93 mg/dL (ref 0–99)
NonHDL: 118.39
Total CHOL/HDL Ratio: 4
Triglycerides: 128 mg/dL (ref 0.0–149.0)
VLDL: 25.6 mg/dL (ref 0.0–40.0)

## 2019-12-07 LAB — PSA, MEDICARE: PSA: 2.48 ng/ml (ref 0.10–4.00)

## 2019-12-07 LAB — TSH: TSH: 7.87 u[IU]/mL — ABNORMAL HIGH (ref 0.35–4.50)

## 2019-12-07 NOTE — Progress Notes (Signed)
The patient is here for annual Medicare wellness examination and management of other chronic and acute problems. Other problems discussed today:  HTN: no probs with meds, no abnl home bp readings. HLD: tolerating statin, due for FLP and hepatic panel today. Also, having about 2 wks of dec hearing in L ear that started after hearing very loud noise from dropping a large rock when working in the yard, started ringing right away and felt "uncomfortable" but no pain really, no ear drainage.  Has still felt some dec hearing on left since--says low frequency sounds "bother it" more.  No hearing abnormality prior to this. He has plans to go to an audiologist.   Past Medical History:  Diagnosis Date  . Acute urinary retention 12/2018   ?BPH  . Barrett's esophagus    Dr. Collene Mares  . Cataract   . Chronic renal insufficiency, stage 2 (mild) 2017   Stage II/III (GFR 60 ml/min)  . GERD (gastroesophageal reflux disease)   . History of adenomatous polyp of colon 09/2007   Polyps at 1st screening colonoscopy; 2014 no polyps.  Rpt 12/29/17: adenomatous polyp-->  Recall 5 yrs.  . Hyperlipemia, mixed    Pt intol of welchol; fenofibrate helpful.  Fenofib inc to 145 and atorva 10mg  started 08/23/18-->great lipid panel 11/2018.  Marland Kitchen Hypertension   . Subclinical hypothyroidism    per old PCP records.  TPO ab's elevated mildly 12/2017, with TSH 6.6 and normal T4 and T3==>developing Hashimoto's.  . Thrombocytopenia ([place])    Hx of, mild/stable with monitoring per old PCP records   Past Surgical History:  Procedure Laterality Date  . APPENDECTOMY    . CATARACT EXTRACTION Bilateral 04/13/2018   05/14/2018  . COLONOSCOPY  12/29/2017   polypectomy --path - adenoma,  Repeat in 5 years  . COLONOSCOPY W/ POLYPECTOMY  2014   per Dr. Collene Mares, recall 02/2023  . ESOPHAGOGASTRODUODENOSCOPY  2014; 2016; 11/16/16   2018--Barrett's esoph reconfirmed  . UPPER GI ENDOSCOPY  03/03/2019   Lab Results  Component Value Date   TSH 5.93  (H) 02/10/2019   Lab Results  Component Value Date   WBC 6.7 08/27/2012   HGB 14.9 08/27/2012   HCT 43.1 08/27/2012   MCV 82.9 08/27/2012   PLT 170 08/27/2012   Lab Results  Component Value Date   CREATININE 1.16 02/10/2019   BUN 25 02/10/2019   NA 133 (L) 02/10/2019   K 4.3 02/10/2019   CL 102 02/10/2019   CO2 24 02/10/2019   Lab Results  Component Value Date   ALT 20 11/14/2018   AST 26 11/14/2018   ALKPHOS 46 11/14/2018   BILITOT 0.5 11/14/2018   Lab Results  Component Value Date   CHOL 159 11/14/2018   Lab Results  Component Value Date   HDL 42.20 11/14/2018   Lab Results  Component Value Date   LDLCALC 93 11/14/2018   Lab Results  Component Value Date   TRIG 123.0 11/14/2018   Lab Results  Component Value Date   CHOLHDL 4 11/14/2018   Lab Results  Component Value Date   PSA 1.84 08/19/2018   PSA 1.86 07/08/2017   PSA 1.78 05/22/2016     AWV DATA The risk factors are reflected in the social history.  The roster of all physicians providing medical care to patient is listed in the Snapshot section of the chart.  Activities of daily living:  The patient is 100% independent in all ADLs: dressing, toileting, feeding as well as independent mobility.  Home safety : The patient has smoke detectors in the home. They wear seatbelts. No firearms at home. There is no violence in the home.   There is no risks for hepatitis, STDs or HIV. There is no history of blood transfusion. They have no travel history to infectious disease endemic areas of the world.  The patient has seen their dentist in the last six month. They have seen their eye doctor in the last year. They deny any hearing difficulty and have not had audiologic testing in the last year.  They do not  have excessive sun exposure. Discussed the need for sun protection: hats, long sleeves and use of sunscreen if there is significant sun exposure.   Diet: the importance of a healthy diet is discussed.  They do have a healthy diet.  The patient has a regular exercise program: walking daily.  The benefits of regular aerobic exercise were discussed.  Depression screen: there are no signs or vegative symptoms of depression- irritability, change in appetite, anhedonia, sadness/tearfullness.  Depression screen [facility] 2/9 12/07/2019  Decreased Interest 0  Down, Depressed, Hopeless 0  PHQ - 2 Score 0    Fall Risk  12/07/2019 11/11/2018 08/16/2018 11/05/2017 07/08/2017  Falls in the past year? 0 0 0 Yes Yes  Comment - - - falling off bike, torn tendons/ligaments -  Number falls in past yr: 0 0 - 1 2 or more  Injury with Fall? 0 0 0 Yes No  Comment - - - - Pt stated that he tripped while working outside a few times  Follow up Falls evaluation completed Falls prevention discussed Falls evaluation completed Falls prevention discussed -   Vitals with BMI 12/07/2019 02/10/2019 01/06/2019  Height 5\' 10"  5\' 10"  -  Weight 217 lbs 3 oz 221 lbs 6 oz -  BMI 78.58 85.02 -  Systolic 774 128 786  Diastolic 78 67 68  Pulse 64 48 64   R ear EAC and TM normal. L ear EAC and TM normal. [place]inne: lateralizes to L ("bad" ear).  Cognitive assessment: the patient manages all their financial and personal affairs and is actively engaged. They could relate day,date,year and events; recalled 3/3 objects at 3 minutes; performed clock-face test normally.  Reviewed advance directives with pt today.  The following portions of the patient's history were reviewed and updated as appropriate: allergies, current medications, past family history, past medical history,  past surgical history, past social history  and problem list.  Vision, hearing, body mass index were assessed and reviewed.   During the course of the visit the patient was educated and counseled about appropriate screening and preventive services including :  Annual wellness visit : today diabetes screening: today (fasting glucose) colorectal cancer screening: hx  of polyps, he has plan for another colonoscopy in a few years. recommended immunizations (influenza, pneumococcal, Hep B)Bone mass measurement Counseling to prevent tobacco use: n/a Depression screening: today, no probs. Glaucoma screening: per eye MD Hepatitis C virus screening: pt decines HIV virus screening: pt declines Lung cancer screening: no hx of smoking Medical nutrition therapy: n/a Prostate cancer screening: PSA today. Ultrasound screening for AAA  A written plan of action regarding the above screening and preventative services was given to the patient today.  HTN: well controlled.  No med changes. BMET today.  HLD: tolerating statin, working on diet/exercise. FLP and hepatic panel today.  Subclinical hypothyroidism: recheck TSH today. Will start T4 if TSH rises above 10.  Conductive hearing loss, L, acute:  pt to get audiology eval but I suspect this will gradually resolve.  F/u: 6 mo RCI  Signed:  Crissie Sickles, MD           12/07/2019

## 2019-12-08 ENCOUNTER — Telehealth: Payer: Self-pay

## 2019-12-08 ENCOUNTER — Encounter: Payer: Self-pay | Admitting: Family Medicine

## 2019-12-08 DIAGNOSIS — H9192 Unspecified hearing loss, left ear: Secondary | ICD-10-CM

## 2019-12-08 NOTE — Telephone Encounter (Signed)
Ok referral ordered

## 2019-12-08 NOTE — Telephone Encounter (Signed)
Needs referral to [facility] to be tested for hearing Their fax number is [phone]  [phone]

## 2019-12-08 NOTE — Telephone Encounter (Signed)
Patient was seen yesterday for hearing loss. Would like referral to [facility] for evaluation. [place] for referral?

## 2020-01-08 ENCOUNTER — Encounter: Payer: Self-pay | Admitting: Family Medicine

## 2020-01-08 DIAGNOSIS — H903 Sensorineural hearing loss, bilateral: Secondary | ICD-10-CM | POA: Diagnosis not present

## 2020-01-12 DIAGNOSIS — R3915 Urgency of urination: Secondary | ICD-10-CM | POA: Diagnosis not present

## 2020-01-12 DIAGNOSIS — R3914 Feeling of incomplete bladder emptying: Secondary | ICD-10-CM | POA: Diagnosis not present

## 2020-01-12 DIAGNOSIS — R350 Frequency of micturition: Secondary | ICD-10-CM | POA: Diagnosis not present

## 2020-01-12 DIAGNOSIS — N50812 Left testicular pain: Secondary | ICD-10-CM | POA: Diagnosis not present

## 2020-01-29 ENCOUNTER — Other Ambulatory Visit: Payer: Self-pay | Admitting: Family Medicine

## 2020-01-30 ENCOUNTER — Encounter: Payer: Self-pay | Admitting: Family Medicine

## 2020-02-07 DIAGNOSIS — H903 Sensorineural hearing loss, bilateral: Secondary | ICD-10-CM | POA: Diagnosis not present

## 2020-02-07 DIAGNOSIS — H9122 Sudden idiopathic hearing loss, left ear: Secondary | ICD-10-CM | POA: Diagnosis not present

## 2020-02-07 DIAGNOSIS — H9312 Tinnitus, left ear: Secondary | ICD-10-CM | POA: Diagnosis not present

## 2020-05-01 ENCOUNTER — Other Ambulatory Visit: Payer: Self-pay

## 2020-05-02 ENCOUNTER — Encounter: Payer: Self-pay | Admitting: Family Medicine

## 2020-05-02 ENCOUNTER — Ambulatory Visit (INDEPENDENT_AMBULATORY_CARE_PROVIDER_SITE_OTHER): Payer: Medicare Other | Admitting: Family Medicine

## 2020-05-02 VITALS — BP 123/74 | HR 63 | Temp 97.6°F | Resp 16 | Ht 70.0 in | Wt 221.2 lb

## 2020-05-02 DIAGNOSIS — H9201 Otalgia, right ear: Secondary | ICD-10-CM

## 2020-05-02 NOTE — Progress Notes (Signed)
CC: ear ache  HPI:  Shawn [name] is a 74 y.o. male who presents to the clinic with a one month history of persistent ear pain.  Pain onset one month ago without inciting event. Pain is improving. Pain is located in "deep right ear" and radiates up the right  temple to the lateral aspect of the right side of the skull. Patient describes intermittent soreness over TMJ as well. Pain is described as a throbbing pain. This appears to be acute on chronic as patient states he has had this same ear pain in the past, but never this persistent. Patient reports past episodes usually resolve with "popping" of his jaw. The patient was seen by chiropractor who performed adjustments focused on right side of head. This improved pain some. Patient does not recall any aggravating agents. Patient states that he believes this to be related to age-related muscle/bone weakening.  Patient denies fever, chills, jaw pain, mouth/teeth pain, facial pain, facial numbness/tingling , dizziness, changes in balance, changes in hearing, changes in vision, or recent trauma.  No jaw pain, oral pain, no edema, pain not shooting.    PMH:  Past Medical History:  Diagnosis Date  . Acute urinary retention 12/2018   ?BPH  . Asymmetrical sensorineural hearing loss 2021   Likely from noise exposure.  [facility] [phone] ENT referral for this dx being relatively new.  . Barrett's esophagus    Dr. Collene Mares  . Cataract   . Chronic renal insufficiency, stage 2 (mild) 2017   Stage II/III (GFR 60 ml/min)  . GERD (gastroesophageal reflux disease)   . History of adenomatous polyp of colon 09/2007   Polyps at 1st screening colonoscopy; 2014 no polyps.  Rpt 12/29/17: adenomatous polyp-->  Recall 5 yrs.  . Hyperlipemia, mixed    Pt intol of welchol; fenofibrate helpful.  Fenofib inc to 145 and atorva 10mg  started 08/23/18-->great lipid panel 11/2018.  Marland Kitchen Hypertension   . Subclinical hypothyroidism    per old PCP records.  TPO ab's  elevated mildly 12/2017, with TSH 6.6 and normal T4 and T3==>developing Hashimoto's.   . Thrombocytopenia ([place])    Hx of, mild/stable with monitoring per old PCP records    M/A: Current Outpatient Medications on File Prior to Visit  Medication Sig Dispense Refill  . aspirin EC 81 MG tablet Take 81 mg by mouth every morning.    Marland Kitchen atorvastatin (LIPITOR) 10 MG tablet TAKE 1 TABLET DAILY 90 tablet 3  . fenofibrate (TRICOR) 145 MG tablet TAKE 1 TABLET DAILY 90 tablet 3  . fosinopril (MONOPRIL) 20 MG tablet TAKE 1 TABLET EVERY MORNING 90 tablet 1  . hydrochlorothiazide (HYDRODIURIL) 25 MG tablet TAKE 1 TABLET DAILY 90 tablet 3  . omeprazole (PRILOSEC) 40 MG capsule Take 1 capsule by mouth daily.    Vladimir Faster Glycol-Propyl Glycol (SYSTANE OP) Apply to eye.     No current facility-administered medications on file prior to visit.   No Known Allergies  FH:  Family History  Problem Relation Age of Onset  . Hypertension Mother   . Hyperlipidemia Mother   . Arthritis Father   . Diabetes Paternal Grandmother 105    SH: Social History   Socioeconomic History  . Marital status: Married    Spouse name: Not on file  . Number of children: Not on file  . Years of education: Not on file  . Highest education level: Not on file  Occupational History  . Not on file  Tobacco Use  .  Smoking status: Never Smoker  . Smokeless tobacco: Never Used  Vaping Use  . Vaping Use: Never used  Substance and Sexual Activity  . Alcohol use: No  . Drug use: No  . Sexual activity: Not on file  Other Topics Concern  . Not on file  Social History Narrative   Married, grand-daughter lives with him.   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   Orig from [place] and [place].   No T/A/Ds.   Exercises 3 X/week--walking 2-3 miles.   Social Determinants of Health   Financial Resource Strain: Not on file  Food Insecurity: Not on file  Transportation Needs: Not on file   Physical Activity: Not on file  Stress: Not on file  Social Connections: Not on file    ROS: Review of Systems  Constitutional: Negative for chills and fever.  HENT: Positive for ear pain. Negative for ear discharge, hearing loss, sinus pain and tinnitus.   Eyes: Negative for blurred vision.  Neurological: Negative for dizziness.    PE: Vitals with BMI 05/02/2020 12/07/2019 02/10/2019  Height 5\' 10"  5\' 10"  5\' 10"   Weight 221 lbs 3 oz 217 lbs 3 oz 221 lbs 6 oz  BMI 31.74 01.60 10.93  Systolic 235 573 220  Diastolic 74 78 67  Pulse 63 64 48    Physical Exam Constitutional:      Appearance: Normal appearance.  HENT:     Head: Normocephalic and atraumatic.     Right Ear: Hearing, ear canal and external ear normal. No swelling or tenderness. No foreign body. No mastoid tenderness. Tympanic membrane has decreased mobility.     Left Ear: Hearing, ear canal and external ear normal. Tympanic membrane has decreased mobility.     Ears:     Comments: Moderate amount of cerumen covering right ear canal which was removed in order to visualize TM. Right TM visualized and appearance normal, not bulging and without evidence of effusion. Some decreased movement of TM, bilaterally. Cardiovascular:     Rate and Rhythm: Normal rate and regular rhythm.     Heart sounds: No murmur heard. No friction rub. No gallop.   Pulmonary:     Effort: Pulmonary effort is normal.     Breath sounds: Normal breath sounds.  Neurological:     Mental Status: He is alert.     Cranial Nerves: No cranial nerve deficit.     Sensory: No sensory deficit.     Labs: No results found for this or any previous visit (from the past 2160 hour(s)).   A/P: In summary, Shawn [name] is a 75 y.o. year old male who present with acute on chronic ear pain.  1) Right ear pain: Onset of pain 1 month ago, has since improved. Today, patient not experiencing pain. No tenderness to palpation of TMJ on exam. External ear without  evidence of trauma. Ear canal normal, TM normal appearance, without bulging or effusion, bilaterally. Patient without fever, chills, jaw pain, mouth/teeth pain, facial pain, facial numbness/tingling , dizziness, changes in balance, changes in hearing, changes in vision, or recent trauma that would be concerning for mass or infection as etiology. This is likely referred pain from MSK etiology, potentially from TMJ dysfunction as patient describes occasional "popping" of joint with some relief in past episodes. - OTC ibuprofen 3 tabs BID PRN for pain.   Lab Orders  No laboratory test(s) ordered today     Follow Up:  PRN for ear pain  Signed: Nanetta Batty, MS3  I personally was present during the history, physical exam, and medical decision-making activities of this service and have verified that the service and findings are accurately documented in the student's note. NO ABNORMALITY ON EXAM OF EARS TODAY EXCEPT SOME CERUMEN IN RIGHT EAC THAT WAS EASILY REMOVED WITH CURETTE AND IS NOT THE CAUSE OF ANY OF HIS PAIN.  REASSURED PT. Signed:  Crissie Sickles, MD           05/02/2020

## 2020-05-02 NOTE — Progress Notes (Signed)
See student note from this date. Signed:  Phil McGowen, MD           05/02/2020  

## 2020-05-20 ENCOUNTER — Other Ambulatory Visit: Payer: Self-pay | Admitting: Family Medicine

## 2020-06-03 ENCOUNTER — Encounter: Payer: Self-pay | Admitting: Family Medicine

## 2020-06-04 NOTE — Telephone Encounter (Signed)
Yes we'll do fasting lab work at upcoming appt

## 2020-06-04 NOTE — Telephone Encounter (Signed)
Please advise, thanks. Patient was last seen in August for routine f/u

## 2020-06-06 ENCOUNTER — Other Ambulatory Visit: Payer: Self-pay

## 2020-06-06 DIAGNOSIS — R1310 Dysphagia, unspecified: Secondary | ICD-10-CM | POA: Insufficient documentation

## 2020-06-07 ENCOUNTER — Ambulatory Visit (INDEPENDENT_AMBULATORY_CARE_PROVIDER_SITE_OTHER): Payer: Medicare Other | Admitting: Family Medicine

## 2020-06-07 ENCOUNTER — Encounter: Payer: Self-pay | Admitting: Family Medicine

## 2020-06-07 VITALS — BP 131/83 | HR 65 | Temp 97.7°F | Resp 16 | Ht 70.0 in | Wt 220.4 lb

## 2020-06-07 DIAGNOSIS — I10 Essential (primary) hypertension: Secondary | ICD-10-CM | POA: Diagnosis not present

## 2020-06-07 DIAGNOSIS — E038 Other specified hypothyroidism: Secondary | ICD-10-CM

## 2020-06-07 DIAGNOSIS — E7800 Pure hypercholesterolemia, unspecified: Secondary | ICD-10-CM | POA: Diagnosis not present

## 2020-06-07 LAB — COMPREHENSIVE METABOLIC PANEL
ALT: 22 U/L (ref 0–53)
AST: 28 U/L (ref 0–37)
Albumin: 4.6 g/dL (ref 3.5–5.2)
Alkaline Phosphatase: 43 U/L (ref 39–117)
BUN: 24 mg/dL — ABNORMAL HIGH (ref 6–23)
CO2: 28 mEq/L (ref 19–32)
Calcium: 9.9 mg/dL (ref 8.4–10.5)
Chloride: 101 mEq/L (ref 96–112)
Creatinine, Ser: 1.24 mg/dL (ref 0.40–1.50)
GFR: 57.85 mL/min — ABNORMAL LOW (ref >60.00)
Glucose, Bld: 85 mg/dL (ref 70–99)
Potassium: 4 mEq/L (ref 3.5–5.1)
Sodium: 136 mEq/L (ref 135–145)
Total Bilirubin: 0.6 mg/dL (ref 0.2–1.2)
Total Protein: 7.4 g/dL (ref 6.0–8.3)

## 2020-06-07 LAB — LIPID PANEL
Cholesterol: 148 mg/dL (ref 0–200)
HDL: 42.4 mg/dL (ref >39.00)
LDL Cholesterol: 85 mg/dL (ref 0–99)
NonHDL: 105.98
Total CHOL/HDL Ratio: 3
Triglycerides: 103 mg/dL (ref 0.0–149.0)
VLDL: 20.6 mg/dL (ref 0.0–40.0)

## 2020-06-07 LAB — T4, FREE: Free T4: 0.87 ng/dL (ref 0.60–1.60)

## 2020-06-07 LAB — TSH: TSH: 8.04 u[IU]/mL — ABNORMAL HIGH (ref 0.35–4.50)

## 2020-06-07 NOTE — Progress Notes (Signed)
OFFICE VISIT  06/07/2020  CC:  Chief Complaint  Patient presents with  . Follow-up    RCI, 6 mo. Pt is fasting    HPI:    Patient is a 75 y.o. Caucasian male who presents for 6 mo f/u HTN, mixed hyperlipidemia, and subclinical hypothyroidism.  Doing well, no acute complaints.  Walking for exercise daily. Diet is good.  HTN:no home monitoring.  HLD: no probs taking atorva 10 qd and tricor 145 qd.  ROS: no fevers, no CP, no SOB, no wheezing, no cough, no dizziness, no HAs, no rashes, no melena/hematochezia.  No polyuria or polydipsia.  No myalgias or arthralgias.  No focal weakness, paresthesias, or tremors.  No acute vision or hearing abnormalities. No n/v/d or abd pain.  No palpitations.     Past Medical History:  Diagnosis Date  . Acute urinary retention 12/2018   ?BPH  . Asymmetrical sensorineural hearing loss 2021   Likely from noise exposure.  [facility] [phone] ENT referral for this dx being relatively new.  . Barrett's esophagus    Dr. Collene Mares  . Cataract   . Chronic renal insufficiency, stage 2 (mild) 2017   Stage II/III (GFR 60 ml/min)  . GERD (gastroesophageal reflux disease)   . History of adenomatous polyp of colon 09/2007   Polyps at 1st screening colonoscopy; 2014 no polyps.  Rpt 12/29/17: adenomatous polyp-->  Recall 5 yrs.  . Hyperlipemia, mixed    Pt intol of welchol; fenofibrate helpful.  Fenofib inc to 145 and atorva 10mg  started 08/23/18-->great lipid panel 11/2018.  Marland Kitchen Hypertension   . Subclinical hypothyroidism    per old PCP records.  TPO ab's elevated mildly 12/2017, with TSH 6.6 and normal T4 and T3==>developing Hashimoto's.   . Thrombocytopenia ([place])    Hx of, mild/stable with monitoring per old PCP records    Past Surgical History:  Procedure Laterality Date  . APPENDECTOMY    . CATARACT EXTRACTION Bilateral 04/13/2018   05/14/2018  . COLONOSCOPY  12/29/2017   polypectomy --path - adenoma,  Repeat in 5 years  . COLONOSCOPY W/  POLYPECTOMY  2014   per Dr. Collene Mares, recall 02/2023  . ESOPHAGOGASTRODUODENOSCOPY  2014; 2016; 11/16/16   2018--Barrett's esoph reconfirmed  . UPPER GI ENDOSCOPY  03/03/2019    Outpatient Medications Prior to Visit  Medication Sig Dispense Refill  . aspirin EC 81 MG tablet Take 81 mg by mouth every morning.    Marland Kitchen atorvastatin (LIPITOR) 10 MG tablet TAKE 1 TABLET DAILY 90 tablet 3  . fenofibrate (TRICOR) 145 MG tablet TAKE 1 TABLET DAILY 90 tablet 3  . fosinopril (MONOPRIL) 20 MG tablet TAKE 1 TABLET EVERY MORNING 30 tablet 0  . hydrochlorothiazide (HYDRODIURIL) 25 MG tablet TAKE 1 TABLET DAILY 90 tablet 3  . omeprazole (PRILOSEC) 40 MG capsule Take 1 capsule by mouth daily.    Vladimir Faster Glycol-Propyl Glycol (SYSTANE OP) Apply to eye.     No facility-administered medications prior to visit.    No Known Allergies  ROS As per HPI  PE: Vitals with BMI 06/07/2020 05/02/2020 12/07/2019  Height 5\' 10"  5\' 10"  5\' 10"   Weight 220 lbs 6 oz 221 lbs 3 oz 217 lbs 3 oz  BMI 31.62 53.29 92.42  Systolic 683 419 622  Diastolic 83 74 78  Pulse 65 63 64     Gen: Alert, well appearing.  Patient is oriented to person, place, time, and situation. AFFECT: pleasant, lucid thought and speech. CV: RRR, no m/r/g.  LUNGS: CTA bilat, nonlabored resps, good aeration in all lung fields. EXT: no clubbing or cyanosis.  no edema.    LABS:  Lab Results  Component Value Date   TSH 7.87 (H) 12/07/2019   T3TOTAL 84 02/10/2019    Lab Results  Component Value Date   WBC 6.7 08/27/2012   HGB 14.9 08/27/2012   HCT 43.1 08/27/2012   MCV 82.9 08/27/2012   PLT 170 08/27/2012   Lab Results  Component Value Date   CREATININE 1.19 12/07/2019   BUN 21 12/07/2019   NA 136 12/07/2019   K 4.1 12/07/2019   CL 102 12/07/2019   CO2 27 12/07/2019   Lab Results  Component Value Date   ALT 19 12/07/2019   AST 27 12/07/2019   ALKPHOS 48 12/07/2019   BILITOT 0.6 12/07/2019   Lab Results  Component Value Date    CHOL 155 12/07/2019   Lab Results  Component Value Date   HDL 36.60 (L) 12/07/2019   Lab Results  Component Value Date   LDLCALC 93 12/07/2019   Lab Results  Component Value Date   TRIG 128.0 12/07/2019   Lab Results  Component Value Date   CHOLHDL 4 12/07/2019   Lab Results  Component Value Date   PSA 2.48 12/07/2019   PSA 1.84 08/19/2018   PSA 1.86 07/08/2017   IMPRESSION AND PLAN:  1) HTN: stable, cont fosinopril 20 qd and hctz 25 qd. Lytes/cr today.  2) HLD: tolerating atorva 10 qd and tricor 145 qd. FLP and hepatic panel today.  3) Subclinical hypothyroidism: plan is recheck TSH and start T4 supplemement if >10. TSH, T4, T3 today.  An After Visit Summary was printed and given to the patient.  FOLLOW UP: Return in about 6 months (around 12/05/2020) for routine chronic illness f/u.  Signed:  Crissie Sickles, MD           06/07/2020

## 2020-06-08 LAB — T3: T3, Total: 90 ng/dL (ref 76–181)

## 2020-06-10 ENCOUNTER — Encounter: Payer: Self-pay | Admitting: Family Medicine

## 2020-06-11 ENCOUNTER — Other Ambulatory Visit: Payer: Self-pay

## 2020-06-11 MED ORDER — FOSINOPRIL SODIUM 20 MG PO TABS: 20.0000 mg | ORAL_TABLET | Freq: Every morning | ORAL | 3 refills | Status: DC

## 2020-06-25 DIAGNOSIS — L570 Actinic keratosis: Secondary | ICD-10-CM | POA: Diagnosis not present

## 2020-06-25 DIAGNOSIS — D229 Melanocytic nevi, unspecified: Secondary | ICD-10-CM | POA: Diagnosis not present

## 2020-06-25 DIAGNOSIS — L821 Other seborrheic keratosis: Secondary | ICD-10-CM | POA: Diagnosis not present

## 2020-06-25 DIAGNOSIS — L814 Other melanin hyperpigmentation: Secondary | ICD-10-CM | POA: Diagnosis not present

## 2020-06-25 DIAGNOSIS — Z872 Personal history of diseases of the skin and subcutaneous tissue: Secondary | ICD-10-CM | POA: Diagnosis not present

## 2020-06-25 DIAGNOSIS — L84 Corns and callosities: Secondary | ICD-10-CM | POA: Diagnosis not present

## 2020-06-27 DIAGNOSIS — E669 Obesity, unspecified: Secondary | ICD-10-CM | POA: Diagnosis not present

## 2020-06-27 DIAGNOSIS — K2270 Barrett's esophagus without dysplasia: Secondary | ICD-10-CM | POA: Diagnosis not present

## 2020-06-27 DIAGNOSIS — K439 Ventral hernia without obstruction or gangrene: Secondary | ICD-10-CM | POA: Diagnosis not present

## 2020-06-27 DIAGNOSIS — K219 Gastro-esophageal reflux disease without esophagitis: Secondary | ICD-10-CM | POA: Diagnosis not present

## 2020-09-11 DIAGNOSIS — I2699 Other pulmonary embolism without acute cor pulmonale: Secondary | ICD-10-CM

## 2020-09-11 HISTORY — DX: Other pulmonary embolism without acute cor pulmonale: I26.99

## 2020-09-18 ENCOUNTER — Telehealth: Payer: Self-pay

## 2020-09-18 ENCOUNTER — Inpatient Hospital Stay (HOSPITAL_COMMUNITY)
Admission: EM | Admit: 2020-09-18 | Discharge: 2020-09-21 | DRG: 175 | Disposition: A | Discharge status: Home / Self Care | Payer: Medicare Other | Attending: Internal Medicine | Admitting: Internal Medicine

## 2020-09-18 ENCOUNTER — Other Ambulatory Visit: Payer: Self-pay

## 2020-09-18 ENCOUNTER — Encounter (HOSPITAL_COMMUNITY): Payer: Self-pay | Admitting: *Deleted

## 2020-09-18 ENCOUNTER — Emergency Department (HOSPITAL_COMMUNITY): Payer: Medicare Other

## 2020-09-18 DIAGNOSIS — J90 Pleural effusion, not elsewhere classified: Secondary | ICD-10-CM | POA: Diagnosis not present

## 2020-09-18 DIAGNOSIS — I700 Atherosclerosis of aorta: Secondary | ICD-10-CM | POA: Diagnosis not present

## 2020-09-18 DIAGNOSIS — N179 Acute kidney failure, unspecified: Secondary | ICD-10-CM | POA: Diagnosis not present

## 2020-09-18 DIAGNOSIS — I2699 Other pulmonary embolism without acute cor pulmonale: Secondary | ICD-10-CM | POA: Diagnosis not present

## 2020-09-18 DIAGNOSIS — R0602 Shortness of breath: Secondary | ICD-10-CM | POA: Diagnosis not present

## 2020-09-18 DIAGNOSIS — Z20822 Contact with and (suspected) exposure to covid-19: Secondary | ICD-10-CM | POA: Diagnosis present

## 2020-09-18 DIAGNOSIS — R109 Unspecified abdominal pain: Secondary | ICD-10-CM | POA: Diagnosis not present

## 2020-09-18 DIAGNOSIS — E782 Mixed hyperlipidemia: Secondary | ICD-10-CM | POA: Diagnosis present

## 2020-09-18 DIAGNOSIS — E860 Dehydration: Secondary | ICD-10-CM | POA: Diagnosis present

## 2020-09-18 DIAGNOSIS — H905 Unspecified sensorineural hearing loss: Secondary | ICD-10-CM | POA: Diagnosis present

## 2020-09-18 DIAGNOSIS — Z79899 Other long term (current) drug therapy: Secondary | ICD-10-CM

## 2020-09-18 DIAGNOSIS — J189 Pneumonia, unspecified organism: Secondary | ICD-10-CM | POA: Diagnosis present

## 2020-09-18 DIAGNOSIS — Z83438 Family history of other disorder of lipoprotein metabolism and other lipidemia: Secondary | ICD-10-CM

## 2020-09-18 DIAGNOSIS — E038 Other specified hypothyroidism: Secondary | ICD-10-CM | POA: Diagnosis present

## 2020-09-18 DIAGNOSIS — N182 Chronic kidney disease, stage 2 (mild): Secondary | ICD-10-CM | POA: Diagnosis present

## 2020-09-18 DIAGNOSIS — Z8249 Family history of ischemic heart disease and other diseases of the circulatory system: Secondary | ICD-10-CM

## 2020-09-18 DIAGNOSIS — K2270 Barrett's esophagus without dysplasia: Secondary | ICD-10-CM | POA: Diagnosis present

## 2020-09-18 DIAGNOSIS — I10 Essential (primary) hypertension: Secondary | ICD-10-CM | POA: Diagnosis present

## 2020-09-18 DIAGNOSIS — R040 Epistaxis: Secondary | ICD-10-CM | POA: Diagnosis present

## 2020-09-18 DIAGNOSIS — R918 Other nonspecific abnormal finding of lung field: Secondary | ICD-10-CM | POA: Diagnosis not present

## 2020-09-18 DIAGNOSIS — I129 Hypertensive chronic kidney disease with stage 1 through stage 4 chronic kidney disease, or unspecified chronic kidney disease: Secondary | ICD-10-CM | POA: Diagnosis present

## 2020-09-18 DIAGNOSIS — Z7982 Long term (current) use of aspirin: Secondary | ICD-10-CM

## 2020-09-18 DIAGNOSIS — D696 Thrombocytopenia, unspecified: Secondary | ICD-10-CM | POA: Diagnosis not present

## 2020-09-18 DIAGNOSIS — K219 Gastro-esophageal reflux disease without esophagitis: Secondary | ICD-10-CM | POA: Diagnosis present

## 2020-09-18 DIAGNOSIS — R0781 Pleurodynia: Secondary | ICD-10-CM | POA: Diagnosis present

## 2020-09-18 LAB — COMPREHENSIVE METABOLIC PANEL
ALT: 21 U/L (ref 0–44)
AST: 28 U/L (ref 15–41)
Albumin: 4.3 g/dL (ref 3.5–5.0)
Alkaline Phosphatase: 40 U/L (ref 38–126)
Anion gap: 9 (ref 5–15)
BUN: 16 mg/dL (ref 8–23)
CO2: 25 mmol/L (ref 22–32)
Calcium: 9.5 mg/dL (ref 8.9–10.3)
Chloride: 99 mmol/L (ref 98–111)
Creatinine, Ser: 1.06 mg/dL (ref 0.61–1.24)
GFR, Estimated: >60 mL/min (ref >60)
Glucose, Bld: 112 mg/dL — ABNORMAL HIGH (ref 70–99)
Potassium: 3.6 mmol/L (ref 3.5–5.1)
Sodium: 133 mmol/L — ABNORMAL LOW (ref 135–145)
Total Bilirubin: 0.8 mg/dL (ref 0.3–1.2)
Total Protein: 7.7 g/dL (ref 6.5–8.1)

## 2020-09-18 LAB — CBC WITH DIFFERENTIAL/PLATELET
Abs Immature Granulocytes: 0.03 10*3/uL (ref 0.00–0.07)
Basophils Absolute: 0 10*3/uL (ref 0.0–0.1)
Basophils Relative: 0 %
Eosinophils Absolute: 0 10*3/uL (ref 0.0–0.5)
Eosinophils Relative: 0 %
HCT: 38.5 % — ABNORMAL LOW (ref 39.0–52.0)
Hemoglobin: 12.9 g/dL — ABNORMAL LOW (ref 13.0–17.0)
Immature Granulocytes: 0 %
Lymphocytes Relative: 14 %
Lymphs Abs: 1.3 10*3/uL (ref 0.7–4.0)
MCH: 28 pg (ref 26.0–34.0)
MCHC: 33.5 g/dL (ref 30.0–36.0)
MCV: 83.5 fL (ref 80.0–100.0)
Monocytes Absolute: 1.1 10*3/uL — ABNORMAL HIGH (ref 0.1–1.0)
Monocytes Relative: 12 %
Neutro Abs: 6.8 10*3/uL (ref 1.7–7.7)
Neutrophils Relative %: 74 %
Platelets: 168 10*3/uL (ref 150–400)
RBC: 4.61 MIL/uL (ref 4.22–5.81)
RDW: 15.9 % — ABNORMAL HIGH (ref 11.5–15.5)
WBC: 9.2 10*3/uL (ref 4.0–10.5)
nRBC: 0 % (ref 0.0–0.2)

## 2020-09-18 LAB — URINALYSIS, ROUTINE W REFLEX MICROSCOPIC
Bilirubin Urine: NEGATIVE
Glucose, UA: NEGATIVE mg/dL
Hgb urine dipstick: NEGATIVE
Ketones, ur: NEGATIVE mg/dL
Leukocytes,Ua: NEGATIVE
Nitrite: POSITIVE — AB
Protein, ur: NEGATIVE mg/dL
Specific Gravity, Urine: 1.012 (ref 1.005–1.030)
pH: 7 (ref 5.0–8.0)

## 2020-09-18 LAB — LIPASE, BLOOD: Lipase: 25 U/L (ref 11–51)

## 2020-09-18 LAB — D-DIMER, QUANTITATIVE: D-Dimer, Quant: 1.14 ug/mL-FEU — ABNORMAL HIGH (ref 0.00–0.50)

## 2020-09-18 MED ORDER — SODIUM CHLORIDE 0.9 % IV SOLN
1.0000 g | Freq: Once | INTRAVENOUS | Status: AC
  Administered 2020-09-18: 1 g via INTRAVENOUS
  Filled 2020-09-18: qty 10

## 2020-09-18 MED ORDER — AZITHROMYCIN 250 MG PO TABS
500.0000 mg | ORAL_TABLET | Freq: Once | ORAL | Status: AC
  Administered 2020-09-18: 500 mg via ORAL
  Filled 2020-09-18: qty 2

## 2020-09-18 MED ORDER — IOHEXOL 300 MG/ML  SOLN
100.0000 mL | Freq: Once | INTRAMUSCULAR | Status: AC | PRN
  Administered 2020-09-18: 100 mL via INTRAVENOUS

## 2020-09-18 NOTE — H&P (Signed)
TRH H&P    Patient Demographics:    Shawn [name], is a 75 y.o. male  MRN: [id number]  DOB - 1945/12/29  Admit Date - 09/18/2020  Referring MD/NP/PA: Gertie Fey  Outpatient Primary MD for the patient is McGowen, Adrian Blackwater, MD  Patient coming from: [facility]  Chief complaint- chest/abdominal pain   HPI:    Shawn [name]  is a 75 y.o. male, with history of thrombocytopenia, HTN, HLD, colon polyps, GERD, Chronic renal insufficiency, urinary retention, and more presents to the ER with chest/abdominal pain.  Patient reports that the pain started last night just after dinner.  He reports the more his right upper quadrant pain than the chest pain.  He describes it is just below the ribs on the right side, feeling like flatulence or pressure buildup.  He had no relief overnight.  He did have 2 loose bowel movements in the a.m. but not watery.  The pain has been constant.  Reports laying on his left side makes it worse, laying on his right side makes it better.  He noted a low-grade fever at home 100.2, with a forehead scanner.  He has noted shortness of breath since the pain started.  Deep inspiration also makes the pain worse.  He has had no cough.  He has had no leg swelling asymmetric or otherwise.  He does wear TED hose at home.  He uses a cane for ambulation, but reports no change in his mobilization recently.  He has had no long trips, no history of cancer, is not on any hormones.  No recent procedures or surgeries.  Patient reports no sick contacts.  He has no other complaints at this time.  Patient does not smoke, does not drink alcohol, does not use illicit drugs.  He is vaccinated for COVID.  Patient reports he would like to be full code, his wife is power of attorney, he prefers no long-term life support.  In the Temp 98.2, heart rate 56-102, respiratory rate 14-30, blood pressure 169/84, satting at 95% UA is borderline, urine  culture pending CT chest abdomen pelvis shows diffuse fatty infiltration of the liver, no acute abdominal changes.  Shows right lower lobe atelectasis versus infiltrate.  PE cannot be excluded.  Very small right pleural effusion. Chest x-ray shows a right nodular opacity that may represent an airspace consolidation or a pulmonary nodule. EKG shows sinus rhythm, heart rate 73, QTc 414, no ischemic changes No leukocytosis with a white blood cell count of 9.2, hemoglobin 12.9 Chemistry panel is unremarkable Zithromax and Rocephin started in the ED COVID pending Admission requested for observation, mostly to rule out PE in the a.m.  Patient cannot have more contrast today as he is already had CT chest abdomen pelvis with contrast.  D-dimer is borderline at 1.14.  CTA ordered for a.m.    Review of systems:    In addition to the HPI above,  Admits to fever No Headache, No changes with Vision or hearing, No problems swallowing food or Liquids, Admits to lower right sided pleuritic chest  pain, no cough, positive for dyspnea Admits to right upper quadrant pain, No Nausea or Vomiting, bowel movements are regular, No Blood in stool or Urine, No dysuria, No new skin rashes or bruises, No new joints pains-aches,  No new weakness, tingling, numbness in any extremity, No recent weight gain or loss, No polyuria, polydypsia or polyphagia, No significant Mental Stressors.  All other systems reviewed and are negative.    Past History of the following :    Past Medical History:  Diagnosis Date  . Acute urinary retention 12/2018   ?BPH  . Asymmetrical sensorineural hearing loss 2021   Likely from noise exposure.  [facility] [phone] ENT referral for this dx being relatively new.  . Barrett's esophagus    Dr. Collene Mares  . Cataract   . Chronic renal insufficiency, stage 2 (mild) 2017   Stage II/III (GFR 60 ml/min)  . GERD (gastroesophageal reflux disease)   . History of adenomatous polyp  of colon 09/2007   Polyps at 1st screening colonoscopy; 2014 no polyps.  Rpt 12/29/17: adenomatous polyp-->  Recall 5 yrs.  . Hyperlipemia, mixed    Pt intol of welchol; fenofibrate helpful.  Fenofib inc to 145 and atorva 10mg  started 08/23/18-->great lipid panel 11/2018.  Marland Kitchen Hypertension   . Subclinical hypothyroidism    per old PCP records.  TPO ab's elevated mildly 12/2017, with TSH 6.6 and normal T4 and T3==>developing Hashimoto's.   . Thrombocytopenia ([place])    Hx of, mild/stable with monitoring per old PCP records      Past Surgical History:  Procedure Laterality Date  . APPENDECTOMY    . CATARACT EXTRACTION Bilateral 04/13/2018   05/14/2018  . COLONOSCOPY  12/29/2017   polypectomy --path - adenoma,  Repeat in 5 years  . COLONOSCOPY W/ POLYPECTOMY  2014   per Dr. Collene Mares, recall 02/2023  . ESOPHAGOGASTRODUODENOSCOPY  2014; 2016; 11/16/16   2018--Barrett's esoph reconfirmed  . UPPER GI ENDOSCOPY  03/03/2019      Social History:      Social History   Tobacco Use  . Smoking status: Never Smoker  . Smokeless tobacco: Never Used  Substance Use Topics  . Alcohol use: No       Family History :     Family History  Problem Relation Age of Onset  . Hypertension Mother   . Hyperlipidemia Mother   . Arthritis Father   . Diabetes Paternal Grandmother 7      Home Medications:   Prior to Admission medications   Medication Sig Start Date End Date Taking? Authorizing Provider  aspirin EC 81 MG tablet Take 81 mg by mouth every morning.    [provider]  atorvastatin (LIPITOR) 10 MG tablet TAKE 1 TABLET DAILY 01/29/20   McGowen, Adrian Blackwater, MD  fenofibrate (TRICOR) 145 MG tablet TAKE 1 TABLET DAILY 01/29/20   McGowen, Adrian Blackwater, MD  fosinopril (MONOPRIL) 20 MG tablet Take 1 tablet (20 mg total) by mouth every morning. 06/11/20   McGowen, Adrian Blackwater, MD  hydrochlorothiazide (HYDRODIURIL) 25 MG tablet TAKE 1 TABLET DAILY 02/13/19   McGowen, Adrian Blackwater, MD  omeprazole (PRILOSEC)  40 MG capsule Take 1 capsule by mouth daily. 06/13/16   [provider]  Polyethyl Glycol-Propyl Glycol (SYSTANE OP) Apply to eye.    [provider]     Allergies:    No Known Allergies   Physical Exam:   Vitals  Blood pressure (!) 169/84, pulse (!) 102, temperature 98.2 F (36.8 C), temperature source  Oral, resp. rate (!) 26, SpO2 95 %.  1.  General: Patient lying supine in bed,  no acute distress   2. Psychiatric: Alert and oriented x 3, mood and behavior normal for situation, pleasant and cooperative with exam   3. Neurologic: Speech and language are normal, face is symmetric, moves all 4 extremities voluntarily, at baseline without acute deficits on limited exam   4. HEENMT:  Head is atraumatic, normocephalic, pupils reactive to light, neck is supple, trachea is midline, mucous membranes are moist   5. Respiratory : Lungs are clear to auscultation bilaterally without wheezing, rhonchi, rales, no cyanosis, no increase in work of breathing or accessory muscle use   6. Cardiovascular : Heart rate normal, rhythm is regular, no murmurs, rubs or gallops, no peripheral edema, peripheral pulses palpated   7. Gastrointestinal:  Abdomen is soft, nondistended, nontender to palpation bowel sounds active, no masses or organomegaly palpated   8. Skin:  Skin is warm, dry and intact without rashes, acute lesions, or ulcers on limited exam   9.Musculoskeletal:  No acute deformities or trauma, no asymmetry in tone, no peripheral edema, peripheral pulses palpated, no tenderness to palpation in the extremities    Data Review:    CBC Recent Labs  Lab 09/18/20 1550  WBC 9.2  HGB 12.9*  HCT 38.5*  PLT 168  MCV 83.5  MCH 28.0  MCHC 33.5  RDW 15.9*  LYMPHSABS 1.3  MONOABS 1.1*  EOSABS 0.0  BASOSABS 0.0   ------------------------------------------------------------------------------------------------------------------  Results for orders placed or performed  during the hospital encounter of 09/18/20 (from the past 48 hour(s))  CBC with Differential     Status: Abnormal   Collection Time: 09/18/20  3:50 PM  Result Value Ref Range   WBC 9.2 4.0 - 10.5 K/uL   RBC 4.61 4.22 - 5.81 MIL/uL   Hemoglobin 12.9 (L) 13.0 - 17.0 g/dL   HCT 38.5 (L) 39.0 - 52.0 %   MCV 83.5 80.0 - 100.0 fL   MCH 28.0 26.0 - 34.0 pg   MCHC 33.5 30.0 - 36.0 g/dL   RDW 15.9 (H) 11.5 - 15.5 %   Platelets 168 150 - 400 K/uL   nRBC 0.0 0.0 - 0.2 %   Neutrophils Relative % 74 %   Neutro Abs 6.8 1.7 - 7.7 K/uL   Lymphocytes Relative 14 %   Lymphs Abs 1.3 0.7 - 4.0 K/uL   Monocytes Relative 12 %   Monocytes Absolute 1.1 (H) 0.1 - 1.0 K/uL   Eosinophils Relative 0 %   Eosinophils Absolute 0.0 0.0 - 0.5 K/uL   Basophils Relative 0 %   Basophils Absolute 0.0 0.0 - 0.1 K/uL   Immature Granulocytes 0 %   Abs Immature Granulocytes 0.03 0.00 - 0.07 K/uL    Comment: Performed at [facility], [place] [address], [place], [place] [address]  Comprehensive metabolic panel     Status: Abnormal   Collection Time: 09/18/20  3:50 PM  Result Value Ref Range   Sodium 133 (L) 135 - 145 mmol/L   Potassium 3.6 3.5 - 5.1 mmol/L   Chloride 99 98 - 111 mmol/L   CO2 25 22 - 32 mmol/L   Glucose, Bld 112 (H) 70 - 99 mg/dL    Comment: Glucose reference range applies only to samples taken after fasting for at least 8 hours.   BUN 16 8 - 23 mg/dL   Creatinine, Ser 1.06 0.61 - 1.24 mg/dL   Calcium 9.5 8.9 -  10.3 mg/dL   Total Protein 7.7 6.5 - 8.1 g/dL   Albumin 4.3 3.5 - 5.0 g/dL   AST 28 15 - 41 U/L   ALT 21 0 - 44 U/L   Alkaline Phosphatase 40 38 - 126 U/L   Total Bilirubin 0.8 0.3 - 1.2 mg/dL   GFR, Estimated >60 >60 mL/min    Comment: (NOTE) Calculated using the CKD-EPI Creatinine Equation (2021)    Anion gap 9 5 - 15    Comment: Performed at [facility], [place] [address], [place], [place] [address]  Lipase, blood     Status: None   Collection Time:  09/18/20  3:50 PM  Result Value Ref Range   Lipase 25 11 - 51 U/L    Comment: Performed at [facility], [place] [address] [place], [place] [address]  D-dimer, quantitative     Status: Abnormal   Collection Time: 09/18/20  3:50 PM  Result Value Ref Range   D-Dimer, Quant 1.14 (H) 0.00 - 0.50 ug/mL-FEU    Comment: (NOTE) At the manufacturer cut-off value of 0.5 g/mL FEU, this assay has a negative predictive value of 95-100%.This assay is intended for use in conjunction with a clinical pretest probability (PTP) assessment model to exclude pulmonary embolism (PE) and deep venous thrombosis (DVT) in outpatients suspected of PE or DVT. Results should be correlated with clinical presentation. Performed at [facility], [place] [address], [place], [place] [address]   Urinalysis, Routine w reflex microscopic Urine, Clean Catch     Status: Abnormal   Collection Time: 09/18/20  7:52 PM  Result Value Ref Range   Color, Urine YELLOW YELLOW   APPearance HAZY (A) CLEAR   Specific Gravity, Urine 1.012 1.005 - 1.030   pH 7.0 5.0 - 8.0   Glucose, UA NEGATIVE NEGATIVE mg/dL   Hgb urine dipstick NEGATIVE NEGATIVE   Bilirubin Urine NEGATIVE NEGATIVE   Ketones, ur NEGATIVE NEGATIVE mg/dL   Protein, ur NEGATIVE NEGATIVE mg/dL   Nitrite POSITIVE (A) NEGATIVE   Leukocytes,Ua NEGATIVE NEGATIVE   RBC / HPF 0-5 0 - 5 RBC/hpf   WBC, UA 0-5 0 - 5 WBC/hpf   Bacteria, UA RARE (A) NONE SEEN    Comment: Performed at [facility], [place] [address] [place], [place] [address]    Chemistries  Recent Labs  Lab 09/18/20 1550  NA 133*  K 3.6  CL 99  CO2 25  GLUCOSE 112*  BUN 16  CREATININE 1.06  CALCIUM 9.5  AST 28  ALT 21  ALKPHOS 40  BILITOT 0.8    ------------------------------------------------------------------------------------------------------------------  ------------------------------------------------------------------------------------------------------------------ GFR: CrCl cannot be calculated (Unknown ideal weight.). Liver Function Tests: Recent Labs  Lab 09/18/20 1550  AST 28  ALT 21  ALKPHOS 40  BILITOT 0.8  PROT 7.7  ALBUMIN 4.3   Recent Labs  Lab 09/18/20 1550  LIPASE 25   No results for input(s): AMMONIA in the last 168 hours. Coagulation Profile: No results for input(s): INR, PROTIME in the last 168 hours. Cardiac Enzymes: No results for input(s): CKTOTAL, CKMB, CKMBINDEX, TROPONINI in the last 168 hours. BNP (last 3 results) No results for input(s): PROBNP in the last 8760 hours. HbA1C: No results for input(s): HGBA1C in the last 72 hours. CBG: No results for input(s): GLUCAP in the last 168 hours. Lipid Profile: No results for input(s): CHOL, HDL, LDLCALC, TRIG, CHOLHDL, LDLDIRECT in the last 72 hours. Thyroid Function Tests: No results for input(s): TSH, T4TOTAL,  FREET4, T3FREE, THYROIDAB in the last 72 hours. Anemia Panel: No results for input(s): VITAMINB12, FOLATE, FERRITIN, TIBC, IRON, RETICCTPCT in the last 72 hours.  --------------------------------------------------------------------------------------------------------------- Urine analysis:    Component Value Date/Time   COLORURINE YELLOW 09/18/2020 1952   APPEARANCEUR HAZY (A) 09/18/2020 1952   LABSPEC 1.012 09/18/2020 1952   PHURINE 7.0 09/18/2020 1952   GLUCOSEU NEGATIVE 09/18/2020 1952   HGBUR NEGATIVE 09/18/2020 1952   BILIRUBINUR NEGATIVE 09/18/2020 1952   KETONESUR NEGATIVE 09/18/2020 1952   PROTEINUR NEGATIVE 09/18/2020 1952   NITRITE POSITIVE (A) 09/18/2020 1952   LEUKOCYTESUR NEGATIVE 09/18/2020 1952      Imaging Results:    DG Chest 2 View  Result Date: 09/18/2020 CLINICAL DATA:  Abdominal pain since last  night. EXAM: CHEST - 2 VIEW COMPARISON:  September 18, 2020 FINDINGS: The cardiac silhouette is mildly enlarged.  Tortuosity of the aorta. Nodular opacity in the right lower lung field may represent mild peribronchial airspace consolidation or potentially a pulmonary nodule. Osseous structures are without acute abnormality. Soft tissues are grossly normal. IMPRESSION: Nodular opacity in the right lower lung field may represent mild peribronchial airspace consolidation or potentially a pulmonary nodule. Follow-up with nonemergent chest CT may be considered. Electronically Signed   By: Fidela Salisbury M.D.   On: 09/18/2020 17:04   CT Chest W Contrast  Result Date: 09/18/2020 CLINICAL DATA:  Abdominal pain and fever. EXAM: CT CHEST, ABDOMEN, AND PELVIS WITH CONTRAST TECHNIQUE: Multidetector CT imaging of the chest, abdomen and pelvis was performed following the standard protocol during bolus administration of intravenous contrast. CONTRAST:  14mL OMNIPAQUE IOHEXOL 300 MG/ML  SOLN COMPARISON:  None. FINDINGS: CT CHEST FINDINGS Cardiovascular: There is mild calcification of the aortic arch. The pulmonary arteries are markedly limited in evaluation secondary to suboptimal opacification with intravenous contrast. Intraluminal low attenuation is noted involving posteromedial lower lobe branches of the right pulmonary artery (axial CT images 42 through 46, CT series 2). Normal heart size. No pericardial effusion. Mediastinum/Nodes: No enlarged mediastinal, hilar, or axillary lymph nodes. Thyroid gland, trachea, and esophagus demonstrate no significant findings. Lungs/Pleura: Mild-to-moderate severity atelectasis and/or infiltrate is seen within the posterior aspect of the right lower lobe. A very small right pleural effusion is seen. Subcentimeter, pleural based, partially calcified nodules are seen within the anterior aspect of the right upper lobe. No pneumothorax is identified. Musculoskeletal: No chest wall mass or  suspicious bone lesions identified. CT ABDOMEN PELVIS FINDINGS Hepatobiliary: There is diffuse fatty infiltration of the liver parenchyma. No focal liver abnormality is seen. No gallstones, gallbladder wall thickening, or biliary dilatation. Pancreas: Unremarkable. No pancreatic ductal dilatation or surrounding inflammatory changes. Spleen: Normal in size without focal abnormality. Adrenals/Urinary Tract: Adrenal glands are unremarkable. Kidneys are normal in size, without renal calculi or hydronephrosis. A 2.9 cm x 2.6 cm cyst is seen within the anterior aspect of the mid left kidney. Bladder is unremarkable. Stomach/Bowel: Stomach is within normal limits. The appendix is surgically absent. No evidence of bowel wall thickening, distention, or inflammatory changes. Vascular/Lymphatic: Aortic atherosclerosis. No enlarged abdominal or pelvic lymph nodes. Reproductive: The prostate gland is mildly enlarged. Other: A 3.3 cm x 3.2 cm fat containing right inguinal hernia is seen. No abdominopelvic ascites. Musculoskeletal: No acute or significant osseous findings. IMPRESSION: 1. Mild to moderate severity right lower lobe atelectasis and/or infiltrate. 2. Limited evaluation of the pulmonary arteries with intraluminal low attenuation seen involving the lower lobe branches of the right pulmonary artery. A mild amount of pulmonary embolism  cannot be excluded. Correlation with follow-up chest CTA versus nuclear medicine ventilation/perfusion scan is recommended. 3. Very small right pleural effusion. 4. Benign-appearing partially calcified pleural base nodules, as described above. 5. Simple left renal cyst. Electronically Signed   By: Virgina Norfolk M.D.   On: 09/18/2020 18:27   CT Abdomen Pelvis W Contrast  Result Date: 09/18/2020 CLINICAL DATA:  Abdominal pain and fever. EXAM: CT CHEST, ABDOMEN, AND PELVIS WITH CONTRAST TECHNIQUE: Multidetector CT imaging of the chest, abdomen and pelvis was performed following the  standard protocol during bolus administration of intravenous contrast. CONTRAST:  175mL OMNIPAQUE IOHEXOL 300 MG/ML  SOLN COMPARISON:  None. FINDINGS: CT CHEST FINDINGS Cardiovascular: There is mild calcification of the aortic arch. The pulmonary arteries are markedly limited in evaluation secondary to suboptimal opacification with intravenous contrast. Intraluminal low attenuation is noted involving posteromedial lower lobe branches of the right pulmonary artery (axial CT images 42 through 46, CT series 2). Normal heart size. No pericardial effusion. Mediastinum/Nodes: No enlarged mediastinal, hilar, or axillary lymph nodes. Thyroid gland, trachea, and esophagus demonstrate no significant findings. Lungs/Pleura: Mild-to-moderate severity atelectasis and/or infiltrate is seen within the posterior aspect of the right lower lobe. A very small right pleural effusion is seen. Subcentimeter, pleural based, partially calcified nodules are seen within the anterior aspect of the right upper lobe. No pneumothorax is identified. Musculoskeletal: No chest wall mass or suspicious bone lesions identified. CT ABDOMEN PELVIS FINDINGS Hepatobiliary: There is diffuse fatty infiltration of the liver parenchyma. No focal liver abnormality is seen. No gallstones, gallbladder wall thickening, or biliary dilatation. Pancreas: Unremarkable. No pancreatic ductal dilatation or surrounding inflammatory changes. Spleen: Normal in size without focal abnormality. Adrenals/Urinary Tract: Adrenal glands are unremarkable. Kidneys are normal in size, without renal calculi or hydronephrosis. A 2.9 cm x 2.6 cm cyst is seen within the anterior aspect of the mid left kidney. Bladder is unremarkable. Stomach/Bowel: Stomach is within normal limits. The appendix is surgically absent. No evidence of bowel wall thickening, distention, or inflammatory changes. Vascular/Lymphatic: Aortic atherosclerosis. No enlarged abdominal or pelvic lymph nodes.  Reproductive: The prostate gland is mildly enlarged. Other: A 3.3 cm x 3.2 cm fat containing right inguinal hernia is seen. No abdominopelvic ascites. Musculoskeletal: No acute or significant osseous findings. IMPRESSION: 1. Mild to moderate severity right lower lobe atelectasis and/or infiltrate. 2. Limited evaluation of the pulmonary arteries with intraluminal low attenuation seen involving the lower lobe branches of the right pulmonary artery. A mild amount of pulmonary embolism cannot be excluded. Correlation with follow-up chest CTA versus nuclear medicine ventilation/perfusion scan is recommended. 3. Very small right pleural effusion. 4. Benign-appearing partially calcified pleural base nodules, as described above. 5. Simple left renal cyst. Electronically Signed   By: Virgina Norfolk M.D.   On: 09/18/2020 18:27    My personal review of EKG: Rhythm NSR, Rate 73/min, QTc 414,no Acute ST changes   Assessment & Plan:    Active Problems:   CAP (community acquired pneumonia)   1. CAP 1. Continue Rocephin and Zithromax 2. Culture sputum 3. CT chest shows atelectasis vs infiltrate, with reported fever at home 4. Does not meet criteria for sepsis 5. Covid pending 6. Continue to monitor 2. Pleuritic chest pain 1. CT chest - atelectasis vs infiltrate, PE can't be ruled out 2. D Dimer minimally positive 1.14 3. CTA chest after appropriate interval since last contrast 4. EKG without ischemic changes 5. CXR -nodular opacity 6. Tylenol for mild pain, oxy for moderate pain 7. Continue  to monitor 3. HTN 1. Continue ACE-i 4. HLD 1. Continue fenofibrate and statin 5. GERD 1. Continue PPI 6.    DVT Prophylaxis-   heparin - SCDs   AM Labs Ordered, also please review Full Orders  Family Communication: No family at bedside Code Status:  FULL  Admission status: Observation  Time spent in minutes : [place]

## 2020-09-18 NOTE — ED Triage Notes (Signed)
Pt complains of abdominal pain since eating last night. He reports chills and temp of 100.2 earlier today.

## 2020-09-18 NOTE — Telephone Encounter (Signed)
Pt currently admitted at [facility] Patient Name: Shawn [name] HT Gender: Male DOB: 02/24/1946 Age: 75 Y 60 M 15 D Return Phone Number: [phone] (Primary), [phone] (Secondary) Address: City/ State/ Zip: [place]  [address] Client [facility] - Client Client Site [place] - Day Physician Crissie Sickles - MD Contact Type Call Who Is Calling Patient / Member / Family / Caregiver Call Type Triage / Clinical Relationship To Patient Self Return Phone Number [phone] (Primary) Chief Complaint BREATHING - shortness of breath or sounds breathless Reason for Call Symptomatic / Request for [place] states last night he started to develop what felt like gas building up. After about an hour, it was hard for him to take a deep breath without a pain at the top of his stomach. Today, he has not eaten anything yet. He had some loose bowel movements, and he thought that would subside the pain. He is still experiencing pain when he presses down on his stomach. He is also experiencing sharp pain when he lays down on his left side, but the pain is on the right side. He has some shortness of breath as well. [place] Not Listed [place] Translation No Nurse Assessment Nurse: Clovis Riley, RN, Georgina Peer Date/Time (Eastern Time): 09/18/2020 1:43:51 PM Confirm and document reason for call. If symptomatic, describe symptoms. ---Caller states last night he started to develop what felt like gas building up. After about an hour, it was hard for him to take a deep breath without a pain at the top of his stomach. Today, he has not eaten anything yet. He had some loose bowel movements, and he thought that would subside the pain. He is still experiencing pain when he presses down on his stomach. He is also experiencing sharp  pain when he lays down on his left side, but the pain is on the right side. He has some shortness of breath as well. States he is having chills and hot flashes. covid was negative. Does the patient have any new or worsening symptoms? ---Yes Will a triage be completed? ---Yes Related visit to physician within the last 2 weeks? ---No Does the PT have any chronic conditions? (i.e. diabetes, asthma, this includes High risk factors for pregnancy, etc.) ---Yes PLEASE NOTE: All timestamps contained within this report are represented as [place] Standard Time. CONFIDENTIALTY NOTICE: This fax transmission is intended only for the addressee. It contains information that is legally privileged, confidential or otherwise protected from use or disclosure. If you are not the intended recipient, you are strictly prohibited from reviewing, disclosing, copying using or disseminating any of this information or taking any action in reliance on or regarding this information. If you have received this fax in error, please notify us immediately by telephone so that we can arrange for its return to [place]. Phone: [phone], Toll-Free: [phone], Fax: [phone] Page: 2 of 2 Call Id: [id number] Nurse Assessment List chronic conditions. ---HTN, GERD, cholesterol Is this a behavioral health or substance abuse call? ---No Guidelines Guideline Title Affirmed Question Affirmed Notes Nurse Date/Time Eilene Ghazi Time) Abdominal Pain - Upper [1] Pain lasts > 10 minutes AND [2] age > 19 Clovis Riley, RNGeorgina Peer 09/18/2020 1:47:22 PM Disp. Time Eilene Ghazi Time) Disposition Final User 09/18/2020 1:42:31 PM Send to Urgent Queue Susette Racer 09/18/2020 1:49:07 PM Go to ED Now Yes Clovis Riley, RN, Leilani Merl Disagree/Comply  Comply Caller Understands Yes PreDisposition Did not know what to do Care Advice Given Per Guideline GO TO ED NOW: * You need to be seen in the Emergency Department. * Go to the ED at ___________ [place] now. Drive carefully. CALL EMS 911 IF: * Confusion occurs * Passes out or becomes too weak to stand * Severe difficulty breathing occurs. CARE ADVICE given per Abdominal Pain, Upper (Adult) guideline

## 2020-09-18 NOTE — ED Provider Notes (Signed)
Received signout from previous provider, please see her note for complete H&P.  Patient here with upper abdominal pain nausea vomiting and subjective fever since last night.  Currently pending CT scan.  Pt declined pain medication at this time.    8:09 PM CT scan of the chest abdomen pelvis demonstrate mild to moderate right lower lobe atelectasis and or infiltrate.  Furthermore, a mild amount of PE cannot be completely excluded.  Radiologist recommend correlations with follow-up chest CTA versus nuclear medicine VQ scan is recommended.  Small right pleural effusion.  I discussed finding with patient.  He is a bit tachypneic on my exam, O2 sats at 93% on room air.  I would like to start patient on antibiotic to cover for potential lung infection.  However, given the fact that patient can have PE, would like to have patient admitted for overnight hours, Doppler ultrasound as well as chest CTA tomorrow to rule out PE.  Patient voiced understanding and agrees with plan.  9:17 PM UA shows nitrite positive.  Pt however does not endorse dysuria, and pain is upper abdomen.  Pt however is receiving rocephin which will cover for UTI bugs.  Urine culture sent.  Appreciate consultation from [facility] Dr. Clearence Ped who agrees to see and will admit pt for further care.    BP (!) 169/84   Pulse (!) 102   Temp 98.2 F (36.8 C) (Oral)   Resp (!) 26   SpO2 95%   Results for orders placed or performed during the hospital encounter of 09/18/20  CBC with Differential  Result Value Ref Range   WBC 9.2 4.0 - 10.5 K/uL   RBC 4.61 4.22 - 5.81 MIL/uL   Hemoglobin 12.9 (L) 13.0 - 17.0 g/dL   HCT 38.5 (L) 39.0 - 52.0 %   MCV 83.5 80.0 - 100.0 fL   MCH 28.0 26.0 - 34.0 pg   MCHC 33.5 30.0 - 36.0 g/dL   RDW 15.9 (H) 11.5 - 15.5 %   Platelets 168 150 - 400 K/uL   nRBC 0.0 0.0 - 0.2 %   Neutrophils Relative % 74 %   Neutro Abs 6.8 1.7 - 7.7 K/uL   Lymphocytes Relative 14 %   Lymphs Abs 1.3 0.7 - 4.0  K/uL   Monocytes Relative 12 %   Monocytes Absolute 1.1 (H) 0.1 - 1.0 K/uL   Eosinophils Relative 0 %   Eosinophils Absolute 0.0 0.0 - 0.5 K/uL   Basophils Relative 0 %   Basophils Absolute 0.0 0.0 - 0.1 K/uL   Immature Granulocytes 0 %   Abs Immature Granulocytes 0.03 0.00 - 0.07 K/uL  Comprehensive metabolic panel  Result Value Ref Range   Sodium 133 (L) 135 - 145 mmol/L   Potassium 3.6 3.5 - 5.1 mmol/L   Chloride 99 98 - 111 mmol/L   CO2 25 22 - 32 mmol/L   Glucose, Bld 112 (H) 70 - 99 mg/dL   BUN 16 8 - 23 mg/dL   Creatinine, Ser 1.06 0.61 - 1.24 mg/dL   Calcium 9.5 8.9 - 10.3 mg/dL   Total Protein 7.7 6.5 - 8.1 g/dL   Albumin 4.3 3.5 - 5.0 g/dL   AST 28 15 - 41 U/L   ALT 21 0 - 44 U/L   Alkaline Phosphatase 40 38 - 126 U/L   Total Bilirubin 0.8 0.3 - 1.2 mg/dL   GFR, Estimated >60 >60 mL/min   Anion gap 9 5 - 15  Lipase, blood  Result  Value Ref Range   Lipase 25 11 - 51 U/L  Urinalysis, Routine w reflex microscopic Urine, Clean Catch  Result Value Ref Range   Color, Urine YELLOW YELLOW   APPearance HAZY (A) CLEAR   Specific Gravity, Urine 1.012 1.005 - 1.030   pH 7.0 5.0 - 8.0   Glucose, UA NEGATIVE NEGATIVE mg/dL   Hgb urine dipstick NEGATIVE NEGATIVE   Bilirubin Urine NEGATIVE NEGATIVE   Ketones, ur NEGATIVE NEGATIVE mg/dL   Protein, ur NEGATIVE NEGATIVE mg/dL   Nitrite POSITIVE (A) NEGATIVE   Leukocytes,Ua NEGATIVE NEGATIVE   RBC / HPF 0-5 0 - 5 RBC/hpf   WBC, UA 0-5 0 - 5 WBC/hpf   Bacteria, UA RARE (A) NONE SEEN   DG Chest 2 View  Result Date: 09/18/2020 CLINICAL DATA:  Abdominal pain since last night. EXAM: CHEST - 2 VIEW COMPARISON:  September 18, 2020 FINDINGS: The cardiac silhouette is mildly enlarged.  Tortuosity of the aorta. Nodular opacity in the right lower lung field may represent mild peribronchial airspace consolidation or potentially a pulmonary nodule. Osseous structures are without acute abnormality. Soft tissues are grossly normal. IMPRESSION:  Nodular opacity in the right lower lung field may represent mild peribronchial airspace consolidation or potentially a pulmonary nodule. Follow-up with nonemergent chest CT may be considered. Electronically Signed   By: Fidela Salisbury M.D.   On: 09/18/2020 17:04   CT Chest W Contrast  Result Date: 09/18/2020 CLINICAL DATA:  Abdominal pain and fever. EXAM: CT CHEST, ABDOMEN, AND PELVIS WITH CONTRAST TECHNIQUE: Multidetector CT imaging of the chest, abdomen and pelvis was performed following the standard protocol during bolus administration of intravenous contrast. CONTRAST:  178mL OMNIPAQUE IOHEXOL 300 MG/ML  SOLN COMPARISON:  None. FINDINGS: CT CHEST FINDINGS Cardiovascular: There is mild calcification of the aortic arch. The pulmonary arteries are markedly limited in evaluation secondary to suboptimal opacification with intravenous contrast. Intraluminal low attenuation is noted involving posteromedial lower lobe branches of the right pulmonary artery (axial CT images 42 through 46, CT series 2). Normal heart size. No pericardial effusion. Mediastinum/Nodes: No enlarged mediastinal, hilar, or axillary lymph nodes. Thyroid gland, trachea, and esophagus demonstrate no significant findings. Lungs/Pleura: Mild-to-moderate severity atelectasis and/or infiltrate is seen within the posterior aspect of the right lower lobe. A very small right pleural effusion is seen. Subcentimeter, pleural based, partially calcified nodules are seen within the anterior aspect of the right upper lobe. No pneumothorax is identified. Musculoskeletal: No chest wall mass or suspicious bone lesions identified. CT ABDOMEN PELVIS FINDINGS Hepatobiliary: There is diffuse fatty infiltration of the liver parenchyma. No focal liver abnormality is seen. No gallstones, gallbladder wall thickening, or biliary dilatation. Pancreas: Unremarkable. No pancreatic ductal dilatation or surrounding inflammatory changes. Spleen: Normal in size without  focal abnormality. Adrenals/Urinary Tract: Adrenal glands are unremarkable. Kidneys are normal in size, without renal calculi or hydronephrosis. A 2.9 cm x 2.6 cm cyst is seen within the anterior aspect of the mid left kidney. Bladder is unremarkable. Stomach/Bowel: Stomach is within normal limits. The appendix is surgically absent. No evidence of bowel wall thickening, distention, or inflammatory changes. Vascular/Lymphatic: Aortic atherosclerosis. No enlarged abdominal or pelvic lymph nodes. Reproductive: The prostate gland is mildly enlarged. Other: A 3.3 cm x 3.2 cm fat containing right inguinal hernia is seen. No abdominopelvic ascites. Musculoskeletal: No acute or significant osseous findings. IMPRESSION: 1. Mild to moderate severity right lower lobe atelectasis and/or infiltrate. 2. Limited evaluation of the pulmonary arteries with intraluminal low attenuation seen involving the  lower lobe branches of the right pulmonary artery. A mild amount of pulmonary embolism cannot be excluded. Correlation with follow-up chest CTA versus nuclear medicine ventilation/perfusion scan is recommended. 3. Very small right pleural effusion. 4. Benign-appearing partially calcified pleural base nodules, as described above. 5. Simple left renal cyst. Electronically Signed   By: Virgina Norfolk M.D.   On: 09/18/2020 18:27   CT Abdomen Pelvis W Contrast  Result Date: 09/18/2020 CLINICAL DATA:  Abdominal pain and fever. EXAM: CT CHEST, ABDOMEN, AND PELVIS WITH CONTRAST TECHNIQUE: Multidetector CT imaging of the chest, abdomen and pelvis was performed following the standard protocol during bolus administration of intravenous contrast. CONTRAST:  142mL OMNIPAQUE IOHEXOL 300 MG/ML  SOLN COMPARISON:  None. FINDINGS: CT CHEST FINDINGS Cardiovascular: There is mild calcification of the aortic arch. The pulmonary arteries are markedly limited in evaluation secondary to suboptimal opacification with intravenous contrast. Intraluminal  low attenuation is noted involving posteromedial lower lobe branches of the right pulmonary artery (axial CT images 42 through 46, CT series 2). Normal heart size. No pericardial effusion. Mediastinum/Nodes: No enlarged mediastinal, hilar, or axillary lymph nodes. Thyroid gland, trachea, and esophagus demonstrate no significant findings. Lungs/Pleura: Mild-to-moderate severity atelectasis and/or infiltrate is seen within the posterior aspect of the right lower lobe. A very small right pleural effusion is seen. Subcentimeter, pleural based, partially calcified nodules are seen within the anterior aspect of the right upper lobe. No pneumothorax is identified. Musculoskeletal: No chest wall mass or suspicious bone lesions identified. CT ABDOMEN PELVIS FINDINGS Hepatobiliary: There is diffuse fatty infiltration of the liver parenchyma. No focal liver abnormality is seen. No gallstones, gallbladder wall thickening, or biliary dilatation. Pancreas: Unremarkable. No pancreatic ductal dilatation or surrounding inflammatory changes. Spleen: Normal in size without focal abnormality. Adrenals/Urinary Tract: Adrenal glands are unremarkable. Kidneys are normal in size, without renal calculi or hydronephrosis. A 2.9 cm x 2.6 cm cyst is seen within the anterior aspect of the mid left kidney. Bladder is unremarkable. Stomach/Bowel: Stomach is within normal limits. The appendix is surgically absent. No evidence of bowel wall thickening, distention, or inflammatory changes. Vascular/Lymphatic: Aortic atherosclerosis. No enlarged abdominal or pelvic lymph nodes. Reproductive: The prostate gland is mildly enlarged. Other: A 3.3 cm x 3.2 cm fat containing right inguinal hernia is seen. No abdominopelvic ascites. Musculoskeletal: No acute or significant osseous findings. IMPRESSION: 1. Mild to moderate severity right lower lobe atelectasis and/or infiltrate. 2. Limited evaluation of the pulmonary arteries with intraluminal low  attenuation seen involving the lower lobe branches of the right pulmonary artery. A mild amount of pulmonary embolism cannot be excluded. Correlation with follow-up chest CTA versus nuclear medicine ventilation/perfusion scan is recommended. 3. Very small right pleural effusion. 4. Benign-appearing partially calcified pleural base nodules, as described above. 5. Simple left renal cyst. Electronically Signed   By: Virgina Norfolk M.D.   On: 09/18/2020 18:27      Domenic Moras, PA-C 09/18/20 [id number]    Daleen Bo, MD 09/19/20 1137

## 2020-09-18 NOTE — ED Provider Notes (Signed)
[facility] DEPT Provider Note   CSN: [id number] Arrival date & time: 09/18/20  1427     History Chief Complaint  Patient presents with  . Abdominal Pain    Shawn [name] is a 75 y.o. male who presents with concern for epigastric and right upper quadrant pain since eating dinner last night.  He has associated chills, subjective fevers with temp of 100.2 degrees at home earlier today as well as nausea since that time.  He is excessively tender in the epigastrium but denies any diarrhea, melena, hematochezia, or urinary symptoms.  Denies any vomiting.  He denies any history of similar symptoms in the past.  He does endorse some associated shortness of breath today due to worsening of the pain with speaking/deep breath.    I personally reviewed this patient's medical records.  He has history of hypertension, hyperlipidemia, Barrett's esophagus, CKD, and history of adenomatous polyp of the colon.  He is not on anticoagulation.   HPI     Past Medical History:  Diagnosis Date  . Acute urinary retention 12/2018   ?BPH  . Asymmetrical sensorineural hearing loss 2021   Likely from noise exposure.  [facility] [phone] ENT referral for this dx being relatively new.  . Barrett's esophagus    Dr. Collene Mares  . Cataract   . Chronic renal insufficiency, stage 2 (mild) 2017   Stage II/III (GFR 60 ml/min)  . GERD (gastroesophageal reflux disease)   . History of adenomatous polyp of colon 09/2007   Polyps at 1st screening colonoscopy; 2014 no polyps.  Rpt 12/29/17: adenomatous polyp-->  Recall 5 yrs.  . Hyperlipemia, mixed    Pt intol of welchol; fenofibrate helpful.  Fenofib inc to 145 and atorva 10mg  started 08/23/18-->great lipid panel 11/2018.  Marland Kitchen Hypertension   . Subclinical hypothyroidism    per old PCP records.  TPO ab's elevated mildly 12/2017, with TSH 6.6 and normal T4 and T3==>developing Hashimoto's.   . Thrombocytopenia ([place])    Hx of, mild/stable  with monitoring per old PCP records    Patient Active Problem List   Diagnosis Date Noted  . Dysphagia 06/06/2020  . Medicare annual wellness visit, subsequent 04/18/2015  . Prostate cancer screening 04/17/2015  . History of actinic keratoses 12/05/2013  . Esophageal reflux 01/04/2013  . Hypertension, benign 01/04/2013  . Mixed hyperlipidemia 01/04/2013  . Hypothyroidism 10/03/2012    Past Surgical History:  Procedure Laterality Date  . APPENDECTOMY    . CATARACT EXTRACTION Bilateral 04/13/2018   05/14/2018  . COLONOSCOPY  12/29/2017   polypectomy --path - adenoma,  Repeat in 5 years  . COLONOSCOPY W/ POLYPECTOMY  2014   per Dr. Collene Mares, recall 02/2023  . ESOPHAGOGASTRODUODENOSCOPY  2014; 2016; 11/16/16   2018--Barrett's esoph reconfirmed  . UPPER GI ENDOSCOPY  03/03/2019       Family History  Problem Relation Age of Onset  . Hypertension Mother   . Hyperlipidemia Mother   . Arthritis Father   . Diabetes Paternal Grandmother 37    Social History   Tobacco Use  . Smoking status: Never Smoker  . Smokeless tobacco: Never Used  Vaping Use  . Vaping Use: Never used  Substance Use Topics  . Alcohol use: No  . Drug use: No    Home Medications Prior to Admission medications   Medication Sig Start Date End Date Taking? Authorizing Provider  aspirin EC 81 MG tablet Take 81 mg by mouth every morning.    [provider]  atorvastatin (LIPITOR) 10 MG tablet TAKE 1 TABLET DAILY 01/29/20   McGowen, Adrian Blackwater, MD  fenofibrate (TRICOR) 145 MG tablet TAKE 1 TABLET DAILY 01/29/20   McGowen, Adrian Blackwater, MD  fosinopril (MONOPRIL) 20 MG tablet Take 1 tablet (20 mg total) by mouth every morning. 06/11/20   McGowen, Adrian Blackwater, MD  hydrochlorothiazide (HYDRODIURIL) 25 MG tablet TAKE 1 TABLET DAILY 02/13/19   McGowen, Adrian Blackwater, MD  omeprazole (PRILOSEC) 40 MG capsule Take 1 capsule by mouth daily. 06/13/16   [provider]  Polyethyl Glycol-Propyl Glycol (SYSTANE OP) Apply to  eye.    [provider]    Allergies    Patient has no known allergies.  Review of Systems   Review of Systems  Constitutional: Negative.   HENT: Negative.   Respiratory: Positive for shortness of breath. Negative for cough and chest tightness.   Cardiovascular: Negative.   Gastrointestinal: Positive for abdominal pain and nausea. Negative for blood in stool, constipation, diarrhea, rectal pain and vomiting.  Genitourinary: Negative.   Musculoskeletal: Negative.   Skin: Negative.   Neurological: Negative.   Hematological: Negative.     Physical Exam Updated Vital Signs BP 119/82   Pulse 73   Temp 98.2 F (36.8 C) (Oral)   Resp (!) 22   SpO2 95%   Physical Exam Vitals and nursing note reviewed.  Constitutional:      Appearance: He is obese. He is not ill-appearing or toxic-appearing.  HENT:     Head: Normocephalic and atraumatic.     Nose: Nose normal.     Mouth/Throat:     Mouth: Mucous membranes are moist.     Pharynx: Oropharynx is clear. Uvula midline. No oropharyngeal exudate, posterior oropharyngeal erythema or uvula swelling.     Tonsils: No tonsillar exudate.  Eyes:     General: Lids are normal. Vision grossly intact.        Right eye: No discharge.        Left eye: No discharge.     Extraocular Movements: Extraocular movements intact.     Conjunctiva/sclera: Conjunctivae normal.     Pupils: Pupils are equal, round, and reactive to light.  Neck:     Trachea: Trachea and phonation normal.  Cardiovascular:     Rate and Rhythm: Normal rate and regular rhythm.     Pulses: Normal pulses.     Heart sounds: Normal heart sounds. No murmur heard.   Pulmonary:     Effort: Pulmonary effort is normal. No tachypnea, bradypnea, accessory muscle usage or respiratory distress.     Breath sounds: Normal breath sounds. No wheezing or rales.  Chest:     Chest wall: No mass, lacerations, deformity, swelling, tenderness, crepitus or edema.  Abdominal:      General: Bowel sounds are normal. There is no distension.     Palpations: Abdomen is soft.     Tenderness: There is abdominal tenderness in the right upper quadrant, epigastric area and periumbilical area. There is no right CVA tenderness, left CVA tenderness, guarding or rebound. Negative signs include Murphy's sign.  Musculoskeletal:        General: No deformity.     Cervical back: Normal range of motion and neck supple. No crepitus.     Right lower leg: No edema.     Left lower leg: No edema.  Lymphadenopathy:     Cervical: No cervical adenopathy.  Skin:    General: Skin is warm and dry.     Capillary Refill: Capillary  refill takes less than 2 seconds.     Findings: No rash.  Neurological:     Mental Status: He is alert and oriented to person, place, and time. Mental status is at baseline.  Psychiatric:        Mood and Affect: Mood normal.     ED Results / Procedures / Treatments   Labs (all labs ordered are listed, but only abnormal results are displayed) Labs Reviewed  CBC WITH DIFFERENTIAL/PLATELET - Abnormal; Notable for the following components:      Result Value   Hemoglobin 12.9 (*)    HCT 38.5 (*)    RDW 15.9 (*)    Monocytes Absolute 1.1 (*)    All other components within normal limits  COMPREHENSIVE METABOLIC PANEL - Abnormal; Notable for the following components:   Sodium 133 (*)    Glucose, Bld 112 (*)    All other components within normal limits  URINE CULTURE  LIPASE, BLOOD  URINALYSIS, ROUTINE W REFLEX MICROSCOPIC    EKG EKG Interpretation  Date/Time:  Wednesday September 18 2020 15:50:14 EDT Ventricular Rate:  73 PR Interval:  209 QRS Duration: 91 QT Interval:  375 QTC Calculation: 414 R Axis:   34 Text Interpretation: Sinus rhythm No old tracing to compare Confirmed by Dorie Rank [phone]) on 09/18/2020 3:52:08 PM   Radiology DG Chest 2 View  Result Date: 09/18/2020 CLINICAL DATA:  Abdominal pain since last night. EXAM: CHEST - 2 VIEW COMPARISON:   September 18, 2020 FINDINGS: The cardiac silhouette is mildly enlarged.  Tortuosity of the aorta. Nodular opacity in the right lower lung field may represent mild peribronchial airspace consolidation or potentially a pulmonary nodule. Osseous structures are without acute abnormality. Soft tissues are grossly normal. IMPRESSION: Nodular opacity in the right lower lung field may represent mild peribronchial airspace consolidation or potentially a pulmonary nodule. Follow-up with nonemergent chest CT may be considered. Electronically Signed   By: Fidela Salisbury M.D.   On: 09/18/2020 17:04    Procedures Procedures  Medications Ordered in ED Medications  iohexol (OMNIPAQUE) 300 MG/ML solution 100 mL (100 mLs Intravenous Contrast Given 09/18/20 1745)    ED Course  I have reviewed the triage vital signs and the nursing notes.  Pertinent labs & imaging results that were available during my care of the patient were reviewed by me and considered in my medical decision making (see chart for details).    MDM Rules/Calculators/A&P                          75 year old male presents with concern for epigastric and right upper quadrant pain since eating dinner last night with associated nausea, chills, and subjective fevers.  Differential diagnosis of epigastric pain includes but is not limited to: Functional or nonulcer dyspepsia ([facility]), PUD, GERD, Gastritis, (NSAIDs, alcohol, stress, H. pylori, pernicious anemia), pancreatitis / pancreatic cancer, overeating indigestion (high-fat foods, coffee), drugs (aspirin, antibiotics (eg, macrolides, metronidazole), corticosteroids, digoxin, narcotics, theophylline), gastroparesis, gastric volvulus, gastric cancer, lactose intolerance, malabsorption, parasitic infection (Giardia, Strongyloides, Ascaris), abdominal hernia, intestinal ischemia, esophageal rupture,  cholelithiasis /choledocholithiasis / cholangitis, hepatitis, ACS, pericarditis, pneumonia.  Hypertensive on  intake, vital signs otherwise normal.  Cardiopulmonary exam is normal, abdominal exam with exquisite tenderness palpation epigastrium and tenderness palpation in the right upper quadrant without rebound or guarding, no Murphy sign.  Patient is neurovascular intact all 4 extremities.  CBC with baseline anemia with hemoglobin of 12.9, CMP unremarkable, lipase is normal.  UA remains to be collected.    Chest x-ray significant for nodular opacity in the right lower lung field which could represent peribronchial airspace consolidation or pulmonary nodule.  As CT of the abdomen pelvis was going to be obtained, will proceed with CT imaging of the chest as well.  Analgesia offered, patient declined.  EKG with sinus rhythm, no ischemic changes.  Care of this patient signed out to oncoming ED provider, Barbette Hair at time of shift change.  All pertinent HPI, physical exam, and laboratory results were discussed with him prior to my departure.  Appreciate his collaboration in the care of this patient.  Pinchas voiced understanding of his medical evaluation and treatment plan.  Each of his questions was answered to his expressed satisfaction.  Return precautions were given.  Patient has amenable to plan for CT scans at this time.  This chart was dictated using voice recognition software, Dragon. Despite the best efforts of this provider to proofread and correct errors, errors may still occur which can change documentation meaning.  Final Clinical Impression(s) / ED Diagnoses Final diagnoses:  None    Rx / DC Orders ED Discharge Orders    None       Aura Dials 09/18/20 1805    Dorie Rank, MD 09/19/20 [phone]

## 2020-09-19 ENCOUNTER — Encounter (HOSPITAL_COMMUNITY): Payer: Self-pay | Admitting: Family Medicine

## 2020-09-19 ENCOUNTER — Observation Stay (HOSPITAL_COMMUNITY): Payer: Medicare Other

## 2020-09-19 DIAGNOSIS — I129 Hypertensive chronic kidney disease with stage 1 through stage 4 chronic kidney disease, or unspecified chronic kidney disease: Secondary | ICD-10-CM | POA: Diagnosis present

## 2020-09-19 DIAGNOSIS — J189 Pneumonia, unspecified organism: Secondary | ICD-10-CM | POA: Diagnosis present

## 2020-09-19 DIAGNOSIS — J9811 Atelectasis: Secondary | ICD-10-CM | POA: Diagnosis not present

## 2020-09-19 DIAGNOSIS — N179 Acute kidney failure, unspecified: Secondary | ICD-10-CM | POA: Diagnosis present

## 2020-09-19 DIAGNOSIS — R079 Chest pain, unspecified: Secondary | ICD-10-CM | POA: Diagnosis not present

## 2020-09-19 DIAGNOSIS — I2699 Other pulmonary embolism without acute cor pulmonale: Secondary | ICD-10-CM | POA: Diagnosis present

## 2020-09-19 DIAGNOSIS — R040 Epistaxis: Secondary | ICD-10-CM | POA: Diagnosis present

## 2020-09-19 DIAGNOSIS — K2270 Barrett's esophagus without dysplasia: Secondary | ICD-10-CM | POA: Diagnosis present

## 2020-09-19 DIAGNOSIS — Z83438 Family history of other disorder of lipoprotein metabolism and other lipidemia: Secondary | ICD-10-CM | POA: Diagnosis not present

## 2020-09-19 DIAGNOSIS — Z20822 Contact with and (suspected) exposure to covid-19: Secondary | ICD-10-CM | POA: Diagnosis present

## 2020-09-19 DIAGNOSIS — R0602 Shortness of breath: Secondary | ICD-10-CM | POA: Diagnosis not present

## 2020-09-19 DIAGNOSIS — Z7982 Long term (current) use of aspirin: Secondary | ICD-10-CM | POA: Diagnosis not present

## 2020-09-19 DIAGNOSIS — D696 Thrombocytopenia, unspecified: Secondary | ICD-10-CM | POA: Diagnosis present

## 2020-09-19 DIAGNOSIS — N182 Chronic kidney disease, stage 2 (mild): Secondary | ICD-10-CM | POA: Diagnosis present

## 2020-09-19 DIAGNOSIS — R0781 Pleurodynia: Secondary | ICD-10-CM | POA: Diagnosis not present

## 2020-09-19 DIAGNOSIS — E038 Other specified hypothyroidism: Secondary | ICD-10-CM | POA: Diagnosis present

## 2020-09-19 DIAGNOSIS — J90 Pleural effusion, not elsewhere classified: Secondary | ICD-10-CM | POA: Diagnosis not present

## 2020-09-19 DIAGNOSIS — H905 Unspecified sensorineural hearing loss: Secondary | ICD-10-CM | POA: Diagnosis present

## 2020-09-19 DIAGNOSIS — Z8249 Family history of ischemic heart disease and other diseases of the circulatory system: Secondary | ICD-10-CM | POA: Diagnosis not present

## 2020-09-19 DIAGNOSIS — E782 Mixed hyperlipidemia: Secondary | ICD-10-CM | POA: Diagnosis present

## 2020-09-19 DIAGNOSIS — K219 Gastro-esophageal reflux disease without esophagitis: Secondary | ICD-10-CM | POA: Diagnosis present

## 2020-09-19 DIAGNOSIS — Z79899 Other long term (current) drug therapy: Secondary | ICD-10-CM | POA: Diagnosis not present

## 2020-09-19 DIAGNOSIS — E860 Dehydration: Secondary | ICD-10-CM | POA: Diagnosis present

## 2020-09-19 HISTORY — PX: TRANSTHORACIC ECHOCARDIOGRAM: SHX275

## 2020-09-19 LAB — COMPREHENSIVE METABOLIC PANEL
ALT: 19 U/L (ref 0–44)
AST: 23 U/L (ref 15–41)
Albumin: 3.5 g/dL (ref 3.5–5.0)
Alkaline Phosphatase: 36 U/L — ABNORMAL LOW (ref 38–126)
Anion gap: 7 (ref 5–15)
BUN: 17 mg/dL (ref 8–23)
CO2: 26 mmol/L (ref 22–32)
Calcium: 9.4 mg/dL (ref 8.9–10.3)
Chloride: 98 mmol/L (ref 98–111)
Creatinine, Ser: 1.08 mg/dL (ref 0.61–1.24)
GFR, Estimated: >60 mL/min (ref >60)
Glucose, Bld: 146 mg/dL — ABNORMAL HIGH (ref 70–99)
Potassium: 3.4 mmol/L — ABNORMAL LOW (ref 3.5–5.1)
Sodium: 131 mmol/L — ABNORMAL LOW (ref 135–145)
Total Bilirubin: 0.8 mg/dL (ref 0.3–1.2)
Total Protein: 7 g/dL (ref 6.5–8.1)

## 2020-09-19 LAB — CBC WITH DIFFERENTIAL/PLATELET
Abs Immature Granulocytes: 0.03 10*3/uL (ref 0.00–0.07)
Basophils Absolute: 0 10*3/uL (ref 0.0–0.1)
Basophils Relative: 0 %
Eosinophils Absolute: 0 10*3/uL (ref 0.0–0.5)
Eosinophils Relative: 0 %
HCT: 35.5 % — ABNORMAL LOW (ref 39.0–52.0)
Hemoglobin: 11.9 g/dL — ABNORMAL LOW (ref 13.0–17.0)
Immature Granulocytes: 0 %
Lymphocytes Relative: 12 %
Lymphs Abs: 1.1 10*3/uL (ref 0.7–4.0)
MCH: 28.1 pg (ref 26.0–34.0)
MCHC: 33.5 g/dL (ref 30.0–36.0)
MCV: 83.7 fL (ref 80.0–100.0)
Monocytes Absolute: 1.2 10*3/uL — ABNORMAL HIGH (ref 0.1–1.0)
Monocytes Relative: 13 %
Neutro Abs: 6.6 10*3/uL (ref 1.7–7.7)
Neutrophils Relative %: 75 %
Platelets: 153 10*3/uL (ref 150–400)
RBC: 4.24 MIL/uL (ref 4.22–5.81)
RDW: 15.8 % — ABNORMAL HIGH (ref 11.5–15.5)
WBC: 8.9 10*3/uL (ref 4.0–10.5)
nRBC: 0 % (ref 0.0–0.2)

## 2020-09-19 LAB — STREP PNEUMONIAE URINARY ANTIGEN: Strep Pneumo Urinary Antigen: NEGATIVE

## 2020-09-19 LAB — ECHOCARDIOGRAM COMPLETE
Area-P 1/2: 3.6 cm2
S' Lateral: 2.7 cm

## 2020-09-19 LAB — MAGNESIUM: Magnesium: 1.9 mg/dL (ref 1.7–2.4)

## 2020-09-19 LAB — SARS CORONAVIRUS 2 (TAT 6-24 HRS): SARS Coronavirus 2: NEGATIVE

## 2020-09-19 LAB — PROCALCITONIN: Procalcitonin: <0.1 ng/mL

## 2020-09-19 LAB — HEPARIN LEVEL (UNFRACTIONATED): Heparin Unfractionated: <0.1 IU/mL — ABNORMAL LOW (ref 0.30–0.70)

## 2020-09-19 LAB — HIV ANTIBODY (ROUTINE TESTING W REFLEX): HIV Screen 4th Generation wRfx: NONREACTIVE

## 2020-09-19 MED ORDER — LISINOPRIL 20 MG PO TABS
20.0000 mg | ORAL_TABLET | Freq: Every day | ORAL | Status: DC
  Administered 2020-09-19 – 2020-09-20 (×2): 20 mg via ORAL
  Filled 2020-09-19 (×3): qty 1

## 2020-09-19 MED ORDER — HEPARIN (PORCINE) 25000 UT/250ML-% IV SOLN
1900.0000 [IU]/h | INTRAVENOUS | Status: DC
  Administered 2020-09-19: 1500 [IU]/h via INTRAVENOUS
  Administered 2020-09-19: 1900 [IU]/h via INTRAVENOUS
  Filled 2020-09-19 (×2): qty 250

## 2020-09-19 MED ORDER — ASPIRIN EC 81 MG PO TBEC
81.0000 mg | DELAYED_RELEASE_TABLET | Freq: Every morning | ORAL | Status: DC
  Administered 2020-09-19 – 2020-09-21 (×3): 81 mg via ORAL
  Filled 2020-09-19 (×4): qty 1

## 2020-09-19 MED ORDER — ONDANSETRON HCL 4 MG/2ML IJ SOLN: 4.0000 mg | Freq: Four times a day (QID) | INTRAMUSCULAR | Status: DC | PRN

## 2020-09-19 MED ORDER — KETOROLAC TROMETHAMINE 15 MG/ML IJ SOLN
7.5000 mg | Freq: Three times a day (TID) | INTRAMUSCULAR | Status: DC | PRN
  Administered 2020-09-19: 7.5 mg via INTRAVENOUS
  Filled 2020-09-19: qty 1

## 2020-09-19 MED ORDER — ACETAMINOPHEN 325 MG PO TABS
650.0000 mg | ORAL_TABLET | Freq: Four times a day (QID) | ORAL | Status: DC | PRN
  Administered 2020-09-19: 650 mg via ORAL
  Filled 2020-09-19: qty 2

## 2020-09-19 MED ORDER — SODIUM CHLORIDE 0.9 % IV SOLN: 500.0000 mg | INTRAVENOUS | Status: DC

## 2020-09-19 MED ORDER — ATORVASTATIN CALCIUM 10 MG PO TABS
10.0000 mg | ORAL_TABLET | Freq: Every day | ORAL | Status: DC
  Administered 2020-09-19 – 2020-09-21 (×3): 10 mg via ORAL
  Filled 2020-09-19 (×5): qty 1

## 2020-09-19 MED ORDER — SODIUM CHLORIDE 0.9 % IV SOLN: 2.0000 g | INTRAVENOUS | Status: DC

## 2020-09-19 MED ORDER — HEPARIN BOLUS VIA INFUSION
4000.0000 [IU] | Freq: Once | INTRAVENOUS | Status: AC
  Administered 2020-09-19: 4000 [IU] via INTRAVENOUS
  Filled 2020-09-19: qty 4000

## 2020-09-19 MED ORDER — IOHEXOL 350 MG/ML SOLN
100.0000 mL | Freq: Once | INTRAVENOUS | Status: AC | PRN
  Administered 2020-09-19: 100 mL via INTRAVENOUS

## 2020-09-19 MED ORDER — HEPARIN SODIUM (PORCINE) 5000 UNIT/ML IJ SOLN
5000.0000 [IU] | Freq: Three times a day (TID) | INTRAMUSCULAR | Status: DC
  Administered 2020-09-19: 5000 [IU] via SUBCUTANEOUS
  Filled 2020-09-19: qty 1

## 2020-09-19 MED ORDER — OXYCODONE HCL 5 MG PO TABS: 5.0000 mg | ORAL_TABLET | ORAL | Status: DC | PRN

## 2020-09-19 MED ORDER — SODIUM CHLORIDE (PF) 0.9 % IJ SOLN
INTRAMUSCULAR | Status: AC
  Filled 2020-09-19: qty 50

## 2020-09-19 MED ORDER — FENOFIBRATE 54 MG PO TABS
54.0000 mg | ORAL_TABLET | Freq: Every day | ORAL | Status: DC
  Administered 2020-09-19 – 2020-09-21 (×3): 54 mg via ORAL
  Filled 2020-09-19 (×4): qty 1

## 2020-09-19 MED ORDER — PANTOPRAZOLE SODIUM 40 MG PO TBEC
40.0000 mg | DELAYED_RELEASE_TABLET | Freq: Every day | ORAL | Status: DC
  Administered 2020-09-19 – 2020-09-21 (×3): 40 mg via ORAL
  Filled 2020-09-19 (×4): qty 1

## 2020-09-19 MED ORDER — HYDROCHLOROTHIAZIDE 12.5 MG PO CAPS
12.5000 mg | ORAL_CAPSULE | Freq: Every day | ORAL | Status: DC
  Administered 2020-09-20: 12.5 mg via ORAL
  Filled 2020-09-19 (×2): qty 1

## 2020-09-19 NOTE — ED Notes (Signed)
Lab called to collect baseline procalcitonin.

## 2020-09-19 NOTE — Plan of Care (Signed)

## 2020-09-19 NOTE — Progress Notes (Signed)
  Echocardiogram 2D Echocardiogram has been performed.  Shawn [name] 09/19/2020, 9:28 AM

## 2020-09-19 NOTE — Progress Notes (Signed)
All medications compatible via PIV at this time.  RN notified.

## 2020-09-19 NOTE — Progress Notes (Signed)
ANTICOAGULATION CONSULT NOTE - Initial Consult  Pharmacy Consult for IV heparin Indication: pulmonary embolus  No Known Allergies  Patient Measurements:   Heparin Dosing Weight: Note patient 100kg in Feb 2022  Vital Signs: BP: 148/79 (06/09 0800) Pulse Rate: 73 (06/09 0800)  Labs: Recent Labs    09/18/20 1550 09/19/20 0500  HGB 12.9* 11.9*  HCT 38.5* 35.5*  PLT 168 153  CREATININE 1.06 1.08    CrCl cannot be calculated (Unknown ideal weight.).   Medical History: Past Medical History:  Diagnosis Date   Acute urinary retention 12/2018   ?BPH   Asymmetrical sensorineural hearing loss 2021   Likely from noise exposure.  [facility] [phone] ENT referral for this dx being relatively new.   Barrett's esophagus    Dr. Collene Mares   Cataract    Chronic renal insufficiency, stage 2 (mild) 2017   Stage II/III (GFR 60 ml/min)   GERD (gastroesophageal reflux disease)    History of adenomatous polyp of colon 09/2007   Polyps at 1st screening colonoscopy; 2014 no polyps.  Rpt 12/29/17: adenomatous polyp-->  Recall 5 yrs.   Hyperlipemia, mixed    Pt intol of welchol; fenofibrate helpful.  Fenofib inc to 145 and atorva 10mg  started 08/23/18-->great lipid panel 11/2018.   Hypertension    Subclinical hypothyroidism    per old PCP records.  TPO ab's elevated mildly 12/2017, with TSH 6.6 and normal T4 and T3==>developing Hashimoto's.    Thrombocytopenia ([facility])    Hx of, mild/stable with monitoring per old PCP records    Medications:  Scheduled:   aspirin EC  81 mg Oral q morning   atorvastatin  10 mg Oral Daily   fenofibrate  54 mg Oral Daily   hydrochlorothiazide  25 mg Oral Daily   lisinopril  20 mg Oral Daily   pantoprazole  40 mg Oral Daily   Infusions:   azithromycin     cefTRIAXone (ROCEPHIN)  IV     PRN: acetaminophen, ondansetron (ZOFRAN) IV, oxyCODONE  Assessment: 75 yo male presented to ER with chest/abd pain, noted to have CAP and now with new PE. Note also  patient with hx thrombocytopenia - CBC currently good however with Hgb at 11.9 and plts at 153. Also note patient got a dose of SQ UFH 5000 units around 6am. Patient is reportedly on no anticoagulants prior to admission  Goal of Therapy:  Heparin level 0.3-0.7 units/ml Monitor platelets by anticoagulation protocol: Yes   Plan:  Due to SQ heparin being given around 6am, will not give IV heparin bolus at this time Start IV heparin at rate of 1500 units/hr Check heparin level 8 hours after start of IV heparin Daily CBC and heparin level   Adrian Saran, PharmD, BCPS Secure Chat if ?s 09/19/2020 8:30 AM

## 2020-09-19 NOTE — Progress Notes (Signed)
PROGRESS NOTE    Shawn [name]  KGU:[id number] DOB: 1945/05/31 DOA: 09/18/2020 PCP: Tammi Sou, MD    Chief Complaint  Patient presents with   Abdominal Pain    Brief Narrative:  Shawn [name]  is a 75 y.o. male, with history of thrombocytopenia, HTN, HLD, colon polyps, GERD, Chronic renal insufficiency, urinary retention, and more presents to the ER with chest/abdominal pain. Found to have acute PE  Subjective:  Continue report dyspnea, epigastric pain, no hypoxia, no cough noticed during encounter  Assessment & Plan:   Principal Problem:   CAP (community acquired pneumonia) Active Problems:   Esophageal reflux   Hypertension, benign   Mixed hyperlipidemia   Pleuritic chest pain  Acute PE -Patient presented with chest abdominal pain,  -Multi segment pulmonary emboli in the right lower lobe with pulmonary infarct/ischemia.  CTA did not mention right heart strain -He does has tachypnea, no hypoxia, blood pressure stable, no tachycardia -will get echocardiogram and venous Doppler bilateral lower extremity -Start heparin drip -Procalcitonin less than 0.1, chest x-ray findings likely due to PE, will hold further dose of antibiotics  Epigastric pain Nontender on exam, suspect referred pain from PE CT abdomen no acute findings On PPI  Hypertension Continue ACE inhibitor  Hyperlipidemia continue fenofibrate and statin    Unresulted Labs (From admission, onward)     Start     Ordered   09/19/20 0354  HIV Antibody (routine testing w rflx)  (HIV Antibody (Routine testing w reflex) panel)  Once,   STAT        09/19/20 0353   09/19/20 0354  Expectorated Sputum Assessment w Gram Stain, Rflx to Resp Cult  Once,   R        09/19/20 0353   09/19/20 0354  Legionella Pneumophila Serogp 1 Ur Ag  Once,   STAT        09/19/20 0353   09/19/20 0354  Strep pneumoniae urinary antigen  Once,   STAT        09/19/20 0353   09/18/20 2009  SARS CORONAVIRUS 2 (TAT 6-24 HRS)  Nasopharyngeal Nasopharyngeal Swab  (Tier 3 - Symptomatic/asymptomatic)  Once,   STAT       Question Answer Comment  Is this test for diagnosis or screening Screening   Symptomatic for COVID-19 as defined by CDC No   Hospitalized for COVID-19 No   Admitted to ICU for COVID-19 No   Previously tested for COVID-19 No   Resident in a congregate (group) care setting No   Employed in healthcare setting No   Has patient completed COVID vaccination(s) (2 doses of Pfizer/Moderna 1 dose of [employer]) Unknown      09/18/20 2009   09/18/20 1540  Urine culture  ONCE - STAT,   STAT        09/18/20 1540              DVT prophylaxis: heparin injection 5,000 Units Start: 09/19/20 0600 SCDs Start: 09/19/20 0354   Code Status: Full Family Communication: Patient Disposition:   Status is: Inpatient     Dispo: The patient is from: Home              Anticipated d/c is to: Home              Anticipated d/c date is: 24 to 48 hours              Patient currently   Consultants:  None  Procedures:  None  Antimicrobials:   Anti-infectives (From admission, onward)    Start     Dose/Rate Route Frequency Ordered Stop   09/19/20 2100  cefTRIAXone (ROCEPHIN) 2 g in sodium chloride 0.9 % 100 mL IVPB  Status:  Discontinued        2 g 200 mL/hr over 30 Minutes Intravenous Every 24 hours 09/19/20 0353 09/19/20 0934   09/19/20 2100  azithromycin (ZITHROMAX) 500 mg in sodium chloride 0.9 % 250 mL IVPB  Status:  Discontinued        500 mg 250 mL/hr over 60 Minutes Intravenous Every 24 hours 09/19/20 0353 09/19/20 0934   09/18/20 2015  cefTRIAXone (ROCEPHIN) 1 g in sodium chloride 0.9 % 100 mL IVPB        1 g 200 mL/hr over 30 Minutes Intravenous  Once 09/18/20 2009 09/18/20 2252   09/18/20 2015  azithromycin (ZITHROMAX) tablet 500 mg        500 mg Oral  Once 09/18/20 2009 09/18/20 2045           Objective: Vitals:   09/19/20 0045 09/19/20 0100 09/19/20 0351 09/19/20 0737  BP:  (!) 144/75 (!) 147/62 139/62 (!) 147/79  Pulse: 80 81 70 77  Resp: 19 (!) 25 (!) 21 (!) 27  Temp:      TempSrc:      SpO2: 92% 93% 93% 92%    Intake/Output Summary (Last 24 hours) at 09/19/2020 0818 Last data filed at 09/18/2020 2252 Gross per 24 hour  Intake 100 ml  Output --  Net 100 ml   There were no vitals filed for this visit.  Examination:  General exam: calm, NAD Respiratory system: Clear to auscultation. Respiratory effort normal. Cardiovascular system: S1 & S2 heard, RRR. No JVD, no murmur, No pedal edema. Gastrointestinal system: Abdomen is nondistended, soft and nontender.  Normal bowel sounds heard. Central nervous system: Alert and oriented. No focal neurological deficits. Extremities: Symmetric 5 x 5 power. Skin: No rashes, lesions or ulcers Psychiatry: Judgement and insight appear normal. Mood & affect appropriate.     Data Reviewed: I have personally reviewed following labs and imaging studies  CBC: Recent Labs  Lab 09/18/20 1550 09/19/20 0500  WBC 9.2 8.9  NEUTROABS 6.8 6.6  HGB 12.9* 11.9*  HCT 38.5* 35.5*  MCV 83.5 83.7  PLT 168 938    Basic Metabolic Panel: Recent Labs  Lab 09/18/20 1550 09/19/20 0500  NA 133* 131*  K 3.6 3.4*  CL 99 98  CO2 25 26  GLUCOSE 112* 146*  BUN 16 17  CREATININE 1.06 1.08  CALCIUM 9.5 9.4  MG  --  1.9    GFR: CrCl cannot be calculated (Unknown ideal weight.).  Liver Function Tests: Recent Labs  Lab 09/18/20 1550 09/19/20 0500  AST 28 23  ALT 21 19  ALKPHOS 40 36*  BILITOT 0.8 0.8  PROT 7.7 7.0  ALBUMIN 4.3 3.5    CBG: No results for input(s): GLUCAP in the last 168 hours.   No results found for this or any previous visit (from the past 240 hour(s)).       Radiology Studies: DG Chest 2 View  Result Date: 09/18/2020 CLINICAL DATA:  Abdominal pain since last night. EXAM: CHEST - 2 VIEW COMPARISON:  September 18, 2020 FINDINGS: The cardiac silhouette is mildly enlarged.  Tortuosity of the aorta.  Nodular opacity in the right lower lung field may represent mild peribronchial airspace consolidation or potentially a pulmonary nodule. Osseous structures are  without acute abnormality. Soft tissues are grossly normal. IMPRESSION: Nodular opacity in the right lower lung field may represent mild peribronchial airspace consolidation or potentially a pulmonary nodule. Follow-up with nonemergent chest CT may be considered. Electronically Signed   By: Fidela Salisbury M.D.   On: 09/18/2020 17:04   CT Chest W Contrast  Result Date: 09/18/2020 CLINICAL DATA:  Abdominal pain and fever. EXAM: CT CHEST, ABDOMEN, AND PELVIS WITH CONTRAST TECHNIQUE: Multidetector CT imaging of the chest, abdomen and pelvis was performed following the standard protocol during bolus administration of intravenous contrast. CONTRAST:  122mL OMNIPAQUE IOHEXOL 300 MG/ML  SOLN COMPARISON:  None. FINDINGS: CT CHEST FINDINGS Cardiovascular: There is mild calcification of the aortic arch. The pulmonary arteries are markedly limited in evaluation secondary to suboptimal opacification with intravenous contrast. Intraluminal low attenuation is noted involving posteromedial lower lobe branches of the right pulmonary artery (axial CT images 42 through 46, CT series 2). Normal heart size. No pericardial effusion. Mediastinum/Nodes: No enlarged mediastinal, hilar, or axillary lymph nodes. Thyroid gland, trachea, and esophagus demonstrate no significant findings. Lungs/Pleura: Mild-to-moderate severity atelectasis and/or infiltrate is seen within the posterior aspect of the right lower lobe. A very small right pleural effusion is seen. Subcentimeter, pleural based, partially calcified nodules are seen within the anterior aspect of the right upper lobe. No pneumothorax is identified. Musculoskeletal: No chest wall mass or suspicious bone lesions identified. CT ABDOMEN PELVIS FINDINGS Hepatobiliary: There is diffuse fatty infiltration of the liver  parenchyma. No focal liver abnormality is seen. No gallstones, gallbladder wall thickening, or biliary dilatation. Pancreas: Unremarkable. No pancreatic ductal dilatation or surrounding inflammatory changes. Spleen: Normal in size without focal abnormality. Adrenals/Urinary Tract: Adrenal glands are unremarkable. Kidneys are normal in size, without renal calculi or hydronephrosis. A 2.9 cm x 2.6 cm cyst is seen within the anterior aspect of the mid left kidney. Bladder is unremarkable. Stomach/Bowel: Stomach is within normal limits. The appendix is surgically absent. No evidence of bowel wall thickening, distention, or inflammatory changes. Vascular/Lymphatic: Aortic atherosclerosis. No enlarged abdominal or pelvic lymph nodes. Reproductive: The prostate gland is mildly enlarged. Other: A 3.3 cm x 3.2 cm fat containing right inguinal hernia is seen. No abdominopelvic ascites. Musculoskeletal: No acute or significant osseous findings. IMPRESSION: 1. Mild to moderate severity right lower lobe atelectasis and/or infiltrate. 2. Limited evaluation of the pulmonary arteries with intraluminal low attenuation seen involving the lower lobe branches of the right pulmonary artery. A mild amount of pulmonary embolism cannot be excluded. Correlation with follow-up chest CTA versus nuclear medicine ventilation/perfusion scan is recommended. 3. Very small right pleural effusion. 4. Benign-appearing partially calcified pleural base nodules, as described above. 5. Simple left renal cyst. Electronically Signed   By: Virgina Norfolk M.D.   On: 09/18/2020 18:27   CT Angio Chest Pulmonary Embolism (PE) W or WO Contrast  Result Date: 09/19/2020 CLINICAL DATA:  Epigastric and right upper quadrant pain abnormal chest x-ray EXAM: CT ANGIOGRAPHY CHEST WITH CONTRAST TECHNIQUE: Multidetector CT imaging of the chest was performed using the standard protocol during bolus administration of intravenous contrast. Multiplanar CT image  reconstructions and MIPs were obtained to evaluate the vascular anatomy. CONTRAST:  119mL OMNIPAQUE IOHEXOL 350 MG/ML SOLN COMPARISON:  Chest CT from yesterday FINDINGS: Cardiovascular: Multiple branching segmental size pulmonary artery filling defects in the right lower lobe. No right heart strain by RV to LV ratio. Normal heart size. No pericardial effusion. Mediastinum/Nodes: Negative for adenopathy or mass. Lungs/Pleura: Ground-glass opacity and streaky density with  volume loss in the right lower lobe which is considered ischemic. There is a small right pleural effusion considered sympathetic. Mild left lower lobe atelectasis. No pulmonary edema. Upper Abdomen: Negative Musculoskeletal: Negative Review of the MIP images confirms the above findings. Critical Value/emergent results were called by telephone at the time of interpretation on 09/19/2020 at 8:05 am to provider Dr Erlinda Hong, who verbally acknowledged these results. IMPRESSION: Multi segment pulmonary emboli in the right lower lobe with pulmonary infarct/ischemia. Electronically Signed   By: Monte Fantasia M.D.   On: 09/19/2020 08:07   CT Abdomen Pelvis W Contrast  Result Date: 09/18/2020 CLINICAL DATA:  Abdominal pain and fever. EXAM: CT CHEST, ABDOMEN, AND PELVIS WITH CONTRAST TECHNIQUE: Multidetector CT imaging of the chest, abdomen and pelvis was performed following the standard protocol during bolus administration of intravenous contrast. CONTRAST:  16mL OMNIPAQUE IOHEXOL 300 MG/ML  SOLN COMPARISON:  None. FINDINGS: CT CHEST FINDINGS Cardiovascular: There is mild calcification of the aortic arch. The pulmonary arteries are markedly limited in evaluation secondary to suboptimal opacification with intravenous contrast. Intraluminal low attenuation is noted involving posteromedial lower lobe branches of the right pulmonary artery (axial CT images 42 through 46, CT series 2). Normal heart size. No pericardial effusion. Mediastinum/Nodes: No enlarged  mediastinal, hilar, or axillary lymph nodes. Thyroid gland, trachea, and esophagus demonstrate no significant findings. Lungs/Pleura: Mild-to-moderate severity atelectasis and/or infiltrate is seen within the posterior aspect of the right lower lobe. A very small right pleural effusion is seen. Subcentimeter, pleural based, partially calcified nodules are seen within the anterior aspect of the right upper lobe. No pneumothorax is identified. Musculoskeletal: No chest wall mass or suspicious bone lesions identified. CT ABDOMEN PELVIS FINDINGS Hepatobiliary: There is diffuse fatty infiltration of the liver parenchyma. No focal liver abnormality is seen. No gallstones, gallbladder wall thickening, or biliary dilatation. Pancreas: Unremarkable. No pancreatic ductal dilatation or surrounding inflammatory changes. Spleen: Normal in size without focal abnormality. Adrenals/Urinary Tract: Adrenal glands are unremarkable. Kidneys are normal in size, without renal calculi or hydronephrosis. A 2.9 cm x 2.6 cm cyst is seen within the anterior aspect of the mid left kidney. Bladder is unremarkable. Stomach/Bowel: Stomach is within normal limits. The appendix is surgically absent. No evidence of bowel wall thickening, distention, or inflammatory changes. Vascular/Lymphatic: Aortic atherosclerosis. No enlarged abdominal or pelvic lymph nodes. Reproductive: The prostate gland is mildly enlarged. Other: A 3.3 cm x 3.2 cm fat containing right inguinal hernia is seen. No abdominopelvic ascites. Musculoskeletal: No acute or significant osseous findings. IMPRESSION: 1. Mild to moderate severity right lower lobe atelectasis and/or infiltrate. 2. Limited evaluation of the pulmonary arteries with intraluminal low attenuation seen involving the lower lobe branches of the right pulmonary artery. A mild amount of pulmonary embolism cannot be excluded. Correlation with follow-up chest CTA versus nuclear medicine ventilation/perfusion scan is  recommended. 3. Very small right pleural effusion. 4. Benign-appearing partially calcified pleural base nodules, as described above. 5. Simple left renal cyst. Electronically Signed   By: Virgina Norfolk M.D.   On: 09/18/2020 18:27        Scheduled Meds:  aspirin EC  81 mg Oral q morning   atorvastatin  10 mg Oral Daily   fenofibrate  54 mg Oral Daily   heparin  5,000 Units Subcutaneous Q8H   hydrochlorothiazide  25 mg Oral Daily   lisinopril  20 mg Oral Daily   pantoprazole  40 mg Oral Daily   Continuous Infusions:  azithromycin     cefTRIAXone (  ROCEPHIN)  IV       LOS: 0 days   Time spent: 54mins Greater than 50% of this time was spent in counseling, explanation of diagnosis, planning of further management, and coordination of care.   Voice Recognition [employer] dictation system was used to create this note, attempts have been made to correct errors. Please contact the author with questions and/or clarifications.   Florencia Reasons, MD PhD FACP [facility]  Available via Epic secure chat 7am-7pm for nonurgent issues Please page for urgent issues To page the attending provider between 7A-7P or the covering provider during after hours 7P-7A, please log into the web site [email or web address] and access using universal [place] password for that web site. If you do not have the password, please call the hospital operator.    09/19/2020, 8:18 AM

## 2020-09-19 NOTE — Progress Notes (Signed)
ANTICOAGULATION CONSULT NOTE - Initial Consult  Pharmacy Consult for IV heparin Indication: pulmonary embolus  No Known Allergies  Patient Measurements:   Heparin Dosing Weight: Note patient 100kg in Feb 2022  Vital Signs: Temp: 100.4 F (38 C) (06/09 1550) BP: 133/74 (06/09 1550) Pulse Rate: 74 (06/09 1550)  Labs: Recent Labs    09/18/20 1550 09/19/20 0500 09/19/20 1647  HGB 12.9* 11.9*  --   HCT 38.5* 35.5*  --   PLT 168 153  --   HEPARINUNFRC  --   --  <0.10*  CREATININE 1.06 1.08  --      CrCl cannot be calculated (Unknown ideal weight.).   Medications:  Scheduled:   aspirin EC  81 mg Oral q morning   atorvastatin  10 mg Oral Daily   fenofibrate  54 mg Oral Daily   hydrochlorothiazide  12.5 mg Oral Daily   lisinopril  20 mg Oral Daily   pantoprazole  40 mg Oral Daily   Infusions:   heparin 1,500 Units/hr (09/19/20 0905)   PRN: acetaminophen, ketorolac, ondansetron (ZOFRAN) IV, oxyCODONE  Assessment: 75 yo male presented to ER with chest/abd pain, noted to have CAP and now with new PE. Note also patient with hx thrombocytopenia - CBC currently good however with Hgb at 11.9 and plts at 153. Also note patient got a dose of SQ UFH 5000 units around 6am. Patient is reportedly on no anticoagulants prior to admission  Today, 09/19/2020: CBC: Hgb slightly low; Plt stable WNL SCr stable & at baseline Most recent heparin level subtherapeutic on 1500 units/hr - RN verified no interruptions other infusion issues No s/s bleeding per RN Continues on ASA 81 mg daily  Goal of Therapy:  Heparin level 0.3-0.7 units/ml Monitor platelets by anticoagulation protocol: Yes   Plan:  Heparin 4000 units IV bolus x 1 Increase heparin infusion to 1900 units/hr Check heparin level 8 hours after rate change Daily CBC and heparin level F/u long term anticoag plans  Reuel Boom, PharmD, BCPS [phone] 09/19/2020, 6:54 PM

## 2020-09-19 NOTE — Progress Notes (Signed)
BLE venous duplex has been completed.  Results can be found under chart review under CV PROC. 09/19/2020 8:59 AM Desirre Eickhoff RVT, RDMS

## 2020-09-20 DIAGNOSIS — N179 Acute kidney failure, unspecified: Secondary | ICD-10-CM

## 2020-09-20 LAB — HEPARIN LEVEL (UNFRACTIONATED)
Heparin Unfractionated: <0.1 IU/mL — ABNORMAL LOW (ref 0.30–0.70)
Heparin Unfractionated: 0.59 IU/mL (ref 0.30–0.70)

## 2020-09-20 LAB — CBC
HCT: 36 % — ABNORMAL LOW (ref 39.0–52.0)
Hemoglobin: 12 g/dL — ABNORMAL LOW (ref 13.0–17.0)
MCH: 27.8 pg (ref 26.0–34.0)
MCHC: 33.3 g/dL (ref 30.0–36.0)
MCV: 83.5 fL (ref 80.0–100.0)
Platelets: 148 10*3/uL — ABNORMAL LOW (ref 150–400)
RBC: 4.31 MIL/uL (ref 4.22–5.81)
RDW: 15.1 % (ref 11.5–15.5)
WBC: 8.6 10*3/uL (ref 4.0–10.5)
nRBC: 0 % (ref 0.0–0.2)

## 2020-09-20 LAB — BASIC METABOLIC PANEL
Anion gap: 7 (ref 5–15)
BUN: 23 mg/dL (ref 8–23)
CO2: 25 mmol/L (ref 22–32)
Calcium: 8.8 mg/dL — ABNORMAL LOW (ref 8.9–10.3)
Chloride: 97 mmol/L — ABNORMAL LOW (ref 98–111)
Creatinine, Ser: 1.35 mg/dL — ABNORMAL HIGH (ref 0.61–1.24)
GFR, Estimated: 55 mL/min — ABNORMAL LOW (ref >60)
Glucose, Bld: 107 mg/dL — ABNORMAL HIGH (ref 70–99)
Potassium: 3.6 mmol/L (ref 3.5–5.1)
Sodium: 129 mmol/L — ABNORMAL LOW (ref 135–145)

## 2020-09-20 LAB — URINE CULTURE: Culture: NO GROWTH

## 2020-09-20 LAB — MAGNESIUM: Magnesium: 2.2 mg/dL (ref 1.7–2.4)

## 2020-09-20 LAB — PROCALCITONIN: Procalcitonin: 0.16 ng/mL

## 2020-09-20 MED ORDER — HYDRALAZINE HCL 25 MG PO TABS: 25.0000 mg | ORAL_TABLET | Freq: Three times a day (TID) | ORAL | Status: DC | PRN

## 2020-09-20 MED ORDER — HEPARIN (PORCINE) 25000 UT/250ML-% IV SOLN
2300.0000 [IU]/h | INTRAVENOUS | Status: AC
  Administered 2020-09-20 – 2020-09-21 (×3): 2300 [IU]/h via INTRAVENOUS
  Filled 2020-09-20 (×4): qty 250

## 2020-09-20 MED ORDER — SODIUM CHLORIDE 0.9 % IV SOLN: INTRAVENOUS | Status: DC

## 2020-09-20 MED ORDER — GUAIFENESIN ER 600 MG PO TB12
600.0000 mg | ORAL_TABLET | Freq: Two times a day (BID) | ORAL | Status: DC
  Administered 2020-09-20: 600 mg via ORAL
  Filled 2020-09-20 (×3): qty 1

## 2020-09-20 MED ORDER — FLUTICASONE PROPIONATE 50 MCG/ACT NA SUSP
2.0000 | Freq: Every day | NASAL | Status: DC
  Administered 2020-09-20 – 2020-09-21 (×2): 2 via NASAL
  Filled 2020-09-20: qty 16

## 2020-09-20 MED ORDER — SODIUM CHLORIDE 0.9 % IV BOLUS
1000.0000 mL | Freq: Once | INTRAVENOUS | Status: AC
  Administered 2020-09-20: 1000 mL via INTRAVENOUS

## 2020-09-20 MED ORDER — HEPARIN BOLUS VIA INFUSION
3000.0000 [IU] | Freq: Once | INTRAVENOUS | Status: AC
  Administered 2020-09-20: 3000 [IU] via INTRAVENOUS
  Filled 2020-09-20: qty 3000

## 2020-09-20 NOTE — Progress Notes (Signed)
PROGRESS NOTE    Shawn [name]  OYD:[id number] DOB: September 29, 1945 DOA: 09/18/2020 PCP: Tammi Sou, MD    Chief Complaint  Patient presents with   Abdominal Pain    Brief Narrative:  Shawn [name]  is a 75 y.o. male, with history of thrombocytopenia, HTN, HLD, colon polyps, GERD, Chronic renal insufficiency, urinary retention, and more presents to the ER with chest/abdominal pain. Found to have acute PE  Subjective:  Still have sob when talking, with minimal exertion, continue have nonproductive cough, pain has improved Reports chronic postnasal drip and intermittent cough He sings in church choir twice a week  on Wednesday and Sunday, last time Sunday , he could not go this Wednesday due to feeling sick Still has Poor appetite, has not eaten well since wendesday No fever Cr went up  Sodium is low Had loose stool, reports ab pain has improved from before, nontender on exam  Assessment & Plan:   Principal Problem:   CAP (community acquired pneumonia) Active Problems:   Esophageal reflux   Hypertension, benign   Mixed hyperlipidemia   Pleuritic chest pain   Pulmonary emboli (HCC)  Acute PE, does not appear provoked, unclear etiology -Patient presented with chest /abdominal pain,  -Multi segment pulmonary emboli in the right lower lobe with pulmonary infarct/ischemia.  CTA did not mention right heart strain -He does has tachypnea, no hypoxia, blood pressure stable, no tachycardia -echocardiogram LVEF 70 to 75% , Right ventricular systolic function is mildly reduced. The right ventricular size is normal. There is normal pulmonary artery systolic pressure -venous Doppler bilateral lower extremity no DVT -continue heparin drip for another 24hrs as still has dyspnea -Procalcitonin less than 0.1, urine strep pneumo urine Legionella antigen negative , COVID-19 screen negative , chest x-ray findings likely due to PE, will hold further dose of antibiotics  Epigastric  pain Nontender on exam, suspect referred pain from PE CT abdomen no acute findings On PPI  AKI  Likely from dehydration, he continues have poor oral intake Start hydration Hold lisinopril HCTZ Repeat BMP in the morning    Hypertension Hold  ACE inhibitor due to aki Start hydralazine prn  Hyperlipidemia  continue fenofibrate and statin    Unresulted Labs (From admission, onward)     Start     Ordered   09/21/20 0500  Basic metabolic panel  Tomorrow morning,   R        09/20/20 1228   09/20/20 1800  Heparin level (unfractionated)  Once-Timed,   TIMED        09/20/20 1256   09/20/20 1231  C Difficile Quick Screen w PCR reflex  (C Difficile quick screen w PCR reflex panel )  Once, for 24 hours,   TIMED       References:    CDiff Information Tool   09/20/20 1231   09/20/20 0500  Procalcitonin  Daily,   R      09/19/20 0822   09/20/20 0500  CBC  Daily,   R      09/19/20 0832   09/19/20 0354  Expectorated Sputum Assessment w Gram Stain, Rflx to Resp Cult  Once,   R        09/19/20 0353   09/19/20 0354  Legionella Pneumophila Serogp 1 Ur Ag  Once,   STAT        06 /09/22 0353              DVT prophylaxis: SCDs Start: 09/19/20 0354   Code Status:  Full Family Communication: Patient Disposition:   Status is: Inpatient     Dispo: The patient is from: Home              Anticipated d/c is to: Home              Anticipated d/c date is: 24 to 48 hours, pending on renal function and respiratory status, need to transition to oral anticoagulation                 Consultants:  None  Procedures:  None  Antimicrobials:   Anti-infectives (From admission, onward)    Start     Dose/Rate Route Frequency Ordered Stop   09/19/20 2100  cefTRIAXone (ROCEPHIN) 2 g in sodium chloride 0.9 % 100 mL IVPB  Status:  Discontinued        2 g 200 mL/hr over 30 Minutes Intravenous Every 24 hours 09/19/20 0353 09/19/20 0934   09/19/20 2100  azithromycin (ZITHROMAX) 500 mg in sodium  chloride 0.9 % 250 mL IVPB  Status:  Discontinued        500 mg 250 mL/hr over 60 Minutes Intravenous Every 24 hours 09/19/20 0353 09/19/20 0934   09/18/20 2015  cefTRIAXone (ROCEPHIN) 1 g in sodium chloride 0.9 % 100 mL IVPB        1 g 200 mL/hr over 30 Minutes Intravenous  Once 09/18/20 2009 09/18/20 2252   09/18/20 2015  azithromycin (ZITHROMAX) tablet 500 mg        500 mg Oral  Once 09/18/20 2009 09/18/20 2045           Objective: Vitals:   09/20/20 0342 09/20/20 0640 09/20/20 0915 09/20/20 1341  BP: 124/72  126/69 128/65  Pulse: 67  73 74  Resp: 18  18 18   Temp: 99.7 F (37.6 C)  99.3 F (37.4 C) 100.2 F (37.9 C)  TempSrc: Oral  Oral Oral  SpO2: 94%  94% 93%  Weight:  107.7 kg      Intake/Output Summary (Last 24 hours) at 09/20/2020 1738 Last data filed at 09/20/2020 1500 Gross per 24 hour  Intake 682.3 ml  Output 201 ml  Net 481.3 ml   Filed Weights   09/20/20 0640  Weight: 107.7 kg    Examination:  General exam: calm, NAD Respiratory system: Clear to auscultation. Respiratory effort normal. Cardiovascular system: S1 & S2 heard, RRR. No JVD, no murmur, No pedal edema. Gastrointestinal system: Abdomen is nondistended, soft and nontender.  Normal bowel sounds heard. Central nervous system: Alert and oriented. No focal neurological deficits. Extremities: Symmetric 5 x 5 power. Skin: No rashes, lesions or ulcers Psychiatry: Judgement and insight appear normal. Mood & affect appropriate.     Data Reviewed: I have personally reviewed following labs and imaging studies  CBC: Recent Labs  Lab 09/18/20 1550 09/19/20 0500 09/20/20 0354  WBC 9.2 8.9 8.6  NEUTROABS 6.8 6.6  --   HGB 12.9* 11.9* 12.0*  HCT 38.5* 35.5* 36.0*  MCV 83.5 83.7 83.5  PLT 168 153 148*    Basic Metabolic Panel: Recent Labs  Lab 09/18/20 1550 09/19/20 0500 09/20/20 0354  NA 133* 131* 129*  K 3.6 3.4* 3.6  CL 99 98 97*  CO2 25 26 25   GLUCOSE 112* 146* 107*  BUN 16 17  23   CREATININE 1.06 1.08 1.35*  CALCIUM 9.5 9.4 8.8*  MG  --  1.9 2.2    GFR: Estimated Creatinine Clearance: 59.9 mL/min (A) (by C-G formula based on  SCr of 1.35 mg/dL (H)).  Liver Function Tests: Recent Labs  Lab 09/18/20 1550 09/19/20 0500  AST 28 23  ALT 21 19  ALKPHOS 40 36*  BILITOT 0.8 0.8  PROT 7.7 7.0  ALBUMIN 4.3 3.5    CBG: No results for input(s): GLUCAP in the last 168 hours.   Recent Results (from the past 240 hour(s))  Urine culture     Status: None   Collection Time: 09/18/20  7:52 PM   Specimen: Urine, Clean Catch  Result Value Ref Range Status   Specimen Description   Final    URINE, CLEAN CATCH Performed at [facility], [place] [address] [place], [place] [address]    Special Requests   Final    NONE Performed at [facility], [place] [address] [place], [place] [address]    Culture   Final    NO GROWTH Performed at [facility], [place] [address] [place], [place] [address]    Report Status 09/20/2020 FINAL  Final  SARS CORONAVIRUS 2 (TAT 6-24 HRS) Nasopharyngeal Nasopharyngeal Swab     Status: None   Collection Time: 09/18/20  8:09 PM   Specimen: Nasopharyngeal Swab  Result Value Ref Range Status   SARS Coronavirus 2 NEGATIVE NEGATIVE Final    Comment: (NOTE) SARS-CoV-2 target nucleic acids are NOT DETECTED.  The SARS-CoV-2 RNA is generally detectable in upper and lower respiratory specimens during the acute phase of infection. Negative results do not preclude SARS-CoV-2 infection, do not rule out co-infections with other pathogens, and should not be used as the sole basis for treatment or other patient management decisions. Negative results must be combined with clinical observations, patient history, and epidemiological information. The expected result is Negative.  Fact Sheet for Patients: SugarRoll.be  Fact Sheet for Healthcare  Providers: https://www.woods-mathews.com/  This test is not yet approved or cleared by the [place] FDA and  has been authorized for detection and/or diagnosis of SARS-CoV-2 by FDA under an Emergency Use Authorization (EUA). This EUA will remain  in effect (meaning this test can be used) for the duration of the COVID-19 declaration under Se ction 564(b)(1) of the Act, 21 U.S.C. section 360bbb-3(b)(1), unless the authorization is terminated or revoked sooner.  Performed at [facility], [place] [address] [place], [place] [address]   Culture, blood (routine x 2)     Status: None (Preliminary result)   Collection Time: 09/19/20  6:06 PM   Specimen: BLOOD  Result Value Ref Range Status   Specimen Description   Final    BLOOD RIGHT ANTECUBITAL Performed at [facility] [address] [place], [place] [address]    Special Requests   Final    BOTTLES DRAWN AEROBIC AND ANAEROBIC Blood Culture adequate volume Performed at [facility] [address]  [place], [place] [address]    Culture   Final    NO GROWTH < 12 HOURS Performed at [facility] [address] [place], [place] [address]    Report Status PENDING  Incomplete  Culture, blood (routine x 2)     Status: None (Preliminary result)   Collection Time: 09/19/20  6:06 PM   Specimen: BLOOD LEFT WRIST  Result Value Ref Range Status   Specimen Description   Final    BLOOD LEFT WRIST Performed at [facility], [place] [address], [place], [place] [address]    Special Requests   Final  BOTTLES DRAWN AEROBIC AND ANAEROBIC Blood Culture adequate volume Performed at [facility] [address], [place], [place] [address]    Culture   Final    NO GROWTH < 12 HOURS Performed at [facility] [address], [place],  [address]    Report Status PENDING  Incomplete         Radiology Studies: CT Chest W Contrast  Result  Date: 09/18/2020 CLINICAL DATA:  Abdominal pain and fever. EXAM: CT CHEST, ABDOMEN, AND PELVIS WITH CONTRAST TECHNIQUE: Multidetector CT imaging of the chest, abdomen and pelvis was performed following the standard protocol during bolus administration of intravenous contrast. CONTRAST:  146mL OMNIPAQUE IOHEXOL 300 MG/ML  SOLN COMPARISON:  None. FINDINGS: CT CHEST FINDINGS Cardiovascular: There is mild calcification of the aortic arch. The pulmonary arteries are markedly limited in evaluation secondary to suboptimal opacification with intravenous contrast. Intraluminal low attenuation is noted involving posteromedial lower lobe branches of the right pulmonary artery (axial CT images 42 through 46, CT series 2). Normal heart size. No pericardial effusion. Mediastinum/Nodes: No enlarged mediastinal, hilar, or axillary lymph nodes. Thyroid gland, trachea, and esophagus demonstrate no significant findings. Lungs/Pleura: Mild-to-moderate severity atelectasis and/or infiltrate is seen within the posterior aspect of the right lower lobe. A very small right pleural effusion is seen. Subcentimeter, pleural based, partially calcified nodules are seen within the anterior aspect of the right upper lobe. No pneumothorax is identified. Musculoskeletal: No chest wall mass or suspicious bone lesions identified. CT ABDOMEN PELVIS FINDINGS Hepatobiliary: There is diffuse fatty infiltration of the liver parenchyma. No focal liver abnormality is seen. No gallstones, gallbladder wall thickening, or biliary dilatation. Pancreas: Unremarkable. No pancreatic ductal dilatation or surrounding inflammatory changes. Spleen: Normal in size without focal abnormality. Adrenals/Urinary Tract: Adrenal glands are unremarkable. Kidneys are normal in size, without renal calculi or hydronephrosis. A 2.9 cm x 2.6 cm cyst is seen within the anterior aspect of the mid left kidney. Bladder is unremarkable. Stomach/Bowel: Stomach is within normal limits. The  appendix is surgically absent. No evidence of bowel wall thickening, distention, or inflammatory changes. Vascular/Lymphatic: Aortic atherosclerosis. No enlarged abdominal or pelvic lymph nodes. Reproductive: The prostate gland is mildly enlarged. Other: A 3.3 cm x 3.2 cm fat containing right inguinal hernia is seen. No abdominopelvic ascites. Musculoskeletal: No acute or significant osseous findings. IMPRESSION: 1. Mild to moderate severity right lower lobe atelectasis and/or infiltrate. 2. Limited evaluation of the pulmonary arteries with intraluminal low attenuation seen involving the lower lobe branches of the right pulmonary artery. A mild amount of pulmonary embolism cannot be excluded. Correlation with follow-up chest CTA versus nuclear medicine ventilation/perfusion scan is recommended. 3. Very small right pleural effusion. 4. Benign-appearing partially calcified pleural base nodules, as described above. 5. Simple left renal cyst. Electronically Signed   By: Virgina Norfolk M.D.   On: 09/18/2020 18:27   CT Angio Chest Pulmonary Embolism (PE) W or WO Contrast  Result Date: 09/19/2020 CLINICAL DATA:  Epigastric and right upper quadrant pain abnormal chest x-ray EXAM: CT ANGIOGRAPHY CHEST WITH CONTRAST TECHNIQUE: Multidetector CT imaging of the chest was performed using the standard protocol during bolus administration of intravenous contrast. Multiplanar CT image reconstructions and MIPs were obtained to evaluate the vascular anatomy. CONTRAST:  150mL OMNIPAQUE IOHEXOL 350 MG/ML SOLN COMPARISON:  Chest CT from yesterday FINDINGS: Cardiovascular: Multiple branching segmental size pulmonary artery filling defects in the right lower lobe. No right heart strain by RV to LV ratio. Normal heart size.  No pericardial effusion. Mediastinum/Nodes: Negative for adenopathy or mass. Lungs/Pleura: Ground-glass opacity and streaky density with volume loss in the right lower lobe which is considered ischemic. There is a  small right pleural effusion considered sympathetic. Mild left lower lobe atelectasis. No pulmonary edema. Upper Abdomen: Negative Musculoskeletal: Negative Review of the MIP images confirms the above findings. Critical Value/emergent results were called by telephone at the time of interpretation on 09/19/2020 at 8:05 am to provider Dr Erlinda Hong, who verbally acknowledged these results. IMPRESSION: Multi segment pulmonary emboli in the right lower lobe with pulmonary infarct/ischemia. Electronically Signed   By: Monte Fantasia M.D.   On: 09/19/2020 08:07   CT Abdomen Pelvis W Contrast  Result Date: 09/18/2020 CLINICAL DATA:  Abdominal pain and fever. EXAM: CT CHEST, ABDOMEN, AND PELVIS WITH CONTRAST TECHNIQUE: Multidetector CT imaging of the chest, abdomen and pelvis was performed following the standard protocol during bolus administration of intravenous contrast. CONTRAST:  120mL OMNIPAQUE IOHEXOL 300 MG/ML  SOLN COMPARISON:  None. FINDINGS: CT CHEST FINDINGS Cardiovascular: There is mild calcification of the aortic arch. The pulmonary arteries are markedly limited in evaluation secondary to suboptimal opacification with intravenous contrast. Intraluminal low attenuation is noted involving posteromedial lower lobe branches of the right pulmonary artery (axial CT images 42 through 46, CT series 2). Normal heart size. No pericardial effusion. Mediastinum/Nodes: No enlarged mediastinal, hilar, or axillary lymph nodes. Thyroid gland, trachea, and esophagus demonstrate no significant findings. Lungs/Pleura: Mild-to-moderate severity atelectasis and/or infiltrate is seen within the posterior aspect of the right lower lobe. A very small right pleural effusion is seen. Subcentimeter, pleural based, partially calcified nodules are seen within the anterior aspect of the right upper lobe. No pneumothorax is identified. Musculoskeletal: No chest wall mass or suspicious bone lesions identified. CT ABDOMEN PELVIS FINDINGS  Hepatobiliary: There is diffuse fatty infiltration of the liver parenchyma. No focal liver abnormality is seen. No gallstones, gallbladder wall thickening, or biliary dilatation. Pancreas: Unremarkable. No pancreatic ductal dilatation or surrounding inflammatory changes. Spleen: Normal in size without focal abnormality. Adrenals/Urinary Tract: Adrenal glands are unremarkable. Kidneys are normal in size, without renal calculi or hydronephrosis. A 2.9 cm x 2.6 cm cyst is seen within the anterior aspect of the mid left kidney. Bladder is unremarkable. Stomach/Bowel: Stomach is within normal limits. The appendix is surgically absent. No evidence of bowel wall thickening, distention, or inflammatory changes. Vascular/Lymphatic: Aortic atherosclerosis. No enlarged abdominal or pelvic lymph nodes. Reproductive: The prostate gland is mildly enlarged. Other: A 3.3 cm x 3.2 cm fat containing right inguinal hernia is seen. No abdominopelvic ascites. Musculoskeletal: No acute or significant osseous findings. IMPRESSION: 1. Mild to moderate severity right lower lobe atelectasis and/or infiltrate. 2. Limited evaluation of the pulmonary arteries with intraluminal low attenuation seen involving the lower lobe branches of the right pulmonary artery. A mild amount of pulmonary embolism cannot be excluded. Correlation with follow-up chest CTA versus nuclear medicine ventilation/perfusion scan is recommended. 3. Very small right pleural effusion. 4. Benign-appearing partially calcified pleural base nodules, as described above. 5. Simple left renal cyst. Electronically Signed   By: Virgina Norfolk M.D.   On: 09/18/2020 18:27   ECHOCARDIOGRAM COMPLETE  Result Date: 09/19/2020    ECHOCARDIOGRAM REPORT   Patient Name:   MAHAMED ZALEWSKI Date of Exam: 09/19/2020 Medical Rec #:  [id number]     Height:       70.0 in Accession #:    [id number]    Weight:       220.4  lb Date of Birth:  February 08, 1946     BSA:          2.175 m Patient Age:    77  years      BP:           142/86 mmHg Patient Gender: M             HR:           75 bpm. Exam Location:  [facility] Procedure: 2D Echo, Cardiac Doppler and Color Doppler Indications:    Pulmonary Embolus  History:        Patient has no prior history of Echocardiogram examinations.                 Signs/Symptoms:Shortness of Breath and Dyspnea; Risk                 Factors:Hypertension and Dyslipidemia.  Sonographer:    Dustin Flock Referring Phys: [phone] [place]  1. Left ventricular ejection fraction, by estimation, is 70 to 75%. The left ventricle has hyperdynamic function. The left ventricle has no regional wall motion abnormalities. Left ventricular diastolic parameters were normal.  2. Right ventricular systolic function is mildly reduced. The right ventricular size is normal. There is normal pulmonary artery systolic pressure.  3. Trivial mitral valve regurgitation.  4. The aortic valve is tricuspid. Aortic valve regurgitation is not visualized. Mild aortic valve sclerosis is present, with no evidence of aortic valve stenosis.  5. The inferior vena cava is normal in size with greater than 50% respiratory variability, suggesting right atrial pressure of 3 mmHg. FINDINGS  Left Ventricle: Left ventricular ejection fraction, by estimation, is 70 to 75%. The left ventricle has hyperdynamic function. The left ventricle has no regional wall motion abnormalities. The left ventricular internal cavity size was normal in size. There is no left ventricular hypertrophy. Left ventricular diastolic parameters were normal. Right Ventricle: The right ventricular size is normal. Right vetricular wall thickness was not assessed. Right ventricular systolic function is mildly reduced. There is normal pulmonary artery systolic pressure. The tricuspid regurgitant velocity is 2.20  m/s, and with an assumed right atrial pressure of 3 mmHg, the estimated right ventricular systolic pressure is 51.8 mmHg. Left Atrium:  Left atrial size was normal in size. Right Atrium: Right atrial size was normal in size. Pericardium: There is no evidence of pericardial effusion. Mitral Valve: There is mild thickening of the mitral valve leaflet(s). Mild to moderate mitral annular calcification. Trivial mitral valve regurgitation. Tricuspid Valve: The tricuspid valve is normal in structure. Tricuspid valve regurgitation is trivial. Aortic Valve: The aortic valve is tricuspid. Aortic valve regurgitation is not visualized. Mild aortic valve sclerosis is present, with no evidence of aortic valve stenosis. Pulmonic Valve: The pulmonic valve was normal in structure. Pulmonic valve regurgitation is not visualized. Aorta: The aortic root is normal in size and structure. Venous: The inferior vena cava is normal in size with greater than 50% respiratory variability, suggesting right atrial pressure of 3 mmHg. IAS/Shunts: No atrial level shunt detected by color flow Doppler.  LEFT VENTRICLE PLAX 2D LVIDd:         4.70 cm  Diastology LVIDs:         2.70 cm  LV e' medial:    7.40 cm/s LV PW:         1.20 cm  LV E/e' medial:  13.3 LV IVS:        1.10 cm  LV e' lateral:  9.36 cm/s LVOT diam:     2.40 cm  LV E/e' lateral: 10.5 LV SV:         126 LV SV Index:   58 LVOT Area:     4.52 cm  RIGHT VENTRICLE RV Basal diam:  3.40 cm RV S prime:     5.66 cm/s TAPSE (M-mode): 1.6 cm LEFT ATRIUM             Index       RIGHT ATRIUM           Index LA diam:        3.70 cm 1.70 cm/m  RA Area:     17.90 cm LA Vol (A2C):   34.2 ml 15.72 ml/m RA Volume:   48.60 ml  22.34 ml/m LA Vol (A4C):   33.2 ml 15.26 ml/m LA Biplane Vol: 35.6 ml 16.37 ml/m  AORTIC VALVE LVOT Vmax:   147.00 cm/s LVOT Vmean:  94.600 cm/s LVOT VTI:    0.279 m  AORTA Ao Root diam: 3.20 cm MITRAL VALVE                TRICUSPID VALVE MV Area (PHT): 3.60 cm     TR Peak grad:   19.4 mmHg MV Decel Time: 211 msec     TR Vmax:        220.00 cm/s MV E velocity: 98.50 cm/s MV A velocity: 107.00 cm/s  SHUNTS  MV E/A ratio:  0.92         Systemic VTI:  0.28 m                             Systemic Diam: 2.40 cm Dorris Carnes MD Electronically signed by Dorris Carnes MD Signature Date/Time: 09/19/2020/10:16:46 AM    Final    VAS [place] LOWER EXTREMITY VENOUS (DVT)  Result Date: 09/19/2020  Lower Venous DVT Study Patient Name:  DAIWIK BUFFALO  Date of Exam:   09/19/2020 Medical Rec #: [id number]      Accession #:    [id number] Date of Birth: 01/14/1946      Patient Gender: M Patient Age:   75Y Exam Location:  [facility] [place] Procedure:      VAS [place] LOWER EXTREMITY VENOUS (DVT) Referring Phys: [id number] Melisa Donofrio --------------------------------------------------------------------------------  Indications: Pulmonary embolism, and SOB.  Comparison Study: No previous exams Performing Technologist: Jody Hill RVT, RDMS  Examination Guidelines: A complete evaluation includes B-mode imaging, spectral Doppler, color Doppler, and power Doppler as needed of all accessible portions of each vessel. Bilateral testing is considered an integral part of a complete examination. Limited examinations for reoccurring indications may be performed as noted. The reflux portion of the exam is performed with the patient in reverse Trendelenburg.  +---------+---------------+---------+-----------+----------+--------------+ RIGHT    CompressibilityPhasicitySpontaneityPropertiesThrombus Aging +---------+---------------+---------+-----------+----------+--------------+ CFV      Full           Yes      Yes                                 +---------+---------------+---------+-----------+----------+--------------+ SFJ      Full                                                        +---------+---------------+---------+-----------+----------+--------------+  FV Prox  Full           Yes      Yes                                 +---------+---------------+---------+-----------+----------+--------------+ FV Mid   Full           Yes      Yes                                  +---------+---------------+---------+-----------+----------+--------------+ FV DistalFull           Yes      Yes                                 +---------+---------------+---------+-----------+----------+--------------+ PFV      Full                                                        +---------+---------------+---------+-----------+----------+--------------+ POP      Full           Yes      Yes                                 +---------+---------------+---------+-----------+----------+--------------+ PTV      Full                                                        +---------+---------------+---------+-----------+----------+--------------+ PERO     Full                                                        +---------+---------------+---------+-----------+----------+--------------+   +---------+---------------+---------+-----------+----------+--------------+ LEFT     CompressibilityPhasicitySpontaneityPropertiesThrombus Aging +---------+---------------+---------+-----------+----------+--------------+ CFV      Full           Yes      Yes                                 +---------+---------------+---------+-----------+----------+--------------+ SFJ      Full                                                        +---------+---------------+---------+-----------+----------+--------------+ FV Prox  Full           Yes      Yes                                 +---------+---------------+---------+-----------+----------+--------------+ FV Mid   Full  Yes      Yes                                 +---------+---------------+---------+-----------+----------+--------------+ FV DistalFull           Yes      Yes                                 +---------+---------------+---------+-----------+----------+--------------+ PFV      Full                                                         +---------+---------------+---------+-----------+----------+--------------+ POP      Full           Yes      Yes                                 +---------+---------------+---------+-----------+----------+--------------+ PTV      Full                                                        +---------+---------------+---------+-----------+----------+--------------+ PERO     Full                                                        +---------+---------------+---------+-----------+----------+--------------+     Summary: BILATERAL: - No evidence of deep vein thrombosis seen in the lower extremities, bilaterally. - No evidence of superficial venous thrombosis in the lower extremities, bilaterally. -No evidence of popliteal cyst, bilaterally.   *See table(s) above for measurements and observations. Electronically signed by Monica Martinez MD on 09/19/2020 at 7:11:32 PM.    Final         Scheduled Meds:  aspirin EC  81 mg Oral q morning   atorvastatin  10 mg Oral Daily   fenofibrate  54 mg Oral Daily   fluticasone  2 spray Each Nare Daily   guaiFENesin  600 mg Oral BID   pantoprazole  40 mg Oral Daily   Continuous Infusions:  sodium chloride 75 mL/hr at 09/20/20 1545   heparin 2,300 Units/hr (09/20/20 1211)     LOS: 1 day   Time spent: 63mins Greater than 50% of this time was spent in counseling, explanation of diagnosis, planning of further management, and coordination of care.   Voice Recognition Vivian[employer] dictation system was used to create this note, attempts have been made to correct errors. Please contact the author with questions and/or clarifications.   Florencia Reasons, MD PhD FACP [facility]  Available via Epic secure chat 7am-7pm for nonurgent issues Please page for urgent issues To page the attending provider between 7A-7P or the covering provider during after hours 7P-7A, please log into the web site [email or web address] and access using universal [place]  password for that web site. If you do  not have the password, please call the hospital operator.    09/20/2020, 5:38 PM

## 2020-09-20 NOTE — Progress Notes (Signed)
[place] for IV heparin Indication: pulmonary embolus  No Known Allergies  Patient Measurements: Weight: 107.7 kg (237 lb 7 oz) Heparin Dosing Weight: 96 kg  Vital Signs: Temp: 99.7 F (37.6 C) (06/10 0342) Temp Source: Oral (06/10 0342) BP: 124/72 (06/10 0342) Pulse Rate: 67 (06/10 0342)  Labs: Recent Labs    09/18/20 1550 09/19/20 0500 09/19/20 1647 09/20/20 0354  HGB 12.9* 11.9*  --  12.0*  HCT 38.5* 35.5*  --  36.0*  PLT 168 153  --  148*  HEPARINUNFRC  --   --  <0.10* <0.10*  CREATININE 1.06 1.08  --  1.35*     Estimated Creatinine Clearance: 59.9 mL/min (A) (by C-G formula based on SCr of 1.35 mg/dL (H)).   Medications:  Scheduled:   aspirin EC  81 mg Oral q morning   atorvastatin  10 mg Oral Daily   fenofibrate  54 mg Oral Daily   heparin  3,000 Units Intravenous Once   hydrochlorothiazide  12.5 mg Oral Daily   lisinopril  20 mg Oral Daily   pantoprazole  40 mg Oral Daily   Infusions:   heparin     PRN: acetaminophen, ketorolac, ondansetron (ZOFRAN) IV, oxyCODONE  Assessment: 75 yo male presented to ER with chest/abd pain, noted to have CAP and now with new PE. Note also patient with hx thrombocytopenia - CBC currently good however with Hgb at 11.9 and plts at 153. Also note patient got a dose of SQ UFH 5000 units around 6am. Patient is reportedly on no anticoagulants prior to admission  Today, 09/20/2020: Heparin level remains < 0.1 despite rebolus and heparin gtt rate increase to 1900 units/hr CBC: Hgb slightly low (12); Plt slightly low (148K) RN verified no interruptions other infusion issues No s/s bleeding per RN Continues on ASA 81 mg daily  Goal of Therapy:  Heparin level 0.3-0.7 units/ml Monitor platelets by anticoagulation protocol: Yes   Plan:  Rebolus Heparin 3000 units IV bolus x 1 Increase heparin infusion to 2300 units/hr Check heparin level 8 hours after rate change Daily CBC and heparin  level F/u long term anticoag plans  Leone Haven, PharmD 09/20/2020, 6:48 AM

## 2020-09-20 NOTE — Progress Notes (Signed)
[place] for IV heparin Indication: pulmonary embolus  No Known Allergies  Patient Measurements: Weight: 107.7 kg (237 lb 7 oz) Heparin Dosing Weight: 96 kg  Vital Signs: Temp: 100.2 F (37.9 C) (06/10 1341) Temp Source: Oral (06/10 1341) BP: 128/65 (06/10 1341) Pulse Rate: 74 (06/10 1341)  Labs: Recent Labs    09/18/20 1550 09/19/20 0500 09/19/20 1647 09/20/20 0354 09/20/20 1828  HGB 12.9* 11.9*  --  12.0*  --   HCT 38.5* 35.5*  --  36.0*  --   PLT 168 153  --  148*  --   HEPARINUNFRC  --   --  <0.10* <0.10* 0.59  CREATININE 1.06 1.08  --  1.35*  --     Estimated Creatinine Clearance: 59.9 mL/min (A) (by C-G formula based on SCr of 1.35 mg/dL (H)).   Medications:  Scheduled:   aspirin EC  81 mg Oral q morning   atorvastatin  10 mg Oral Daily   fenofibrate  54 mg Oral Daily   fluticasone  2 spray Each Nare Daily   guaiFENesin  600 mg Oral BID   pantoprazole  40 mg Oral Daily   Infusions:   sodium chloride 75 mL/hr at 09/20/20 1545   heparin 2,300 Units/hr (09/20/20 1211)   PRN: acetaminophen, hydrALAZINE, ketorolac, ondansetron (ZOFRAN) IV, oxyCODONE  Assessment: 75 yo male presented to ER with chest/abd pain, noted to have CAP and now with new PE. Note also patient with hx thrombocytopenia - CBC currently good however with Hgb at 11.9 and plts at 153. Also note patient got a dose of SQ UFH 5000 units around 6am. Patient is reportedly on no anticoagulants prior to admission  Today, 09/20/2020: 0400 Heparin level remains < 0.1, rebolus with 3000 units, rate increase to 2300 units/hr CBC: Hgb slightly low (12); Plt slightly low (148K) RN noted lost IV site after am level, timing of follow up lab adjusted No s/s bleeding per RN Continues on ASA 81 mg daily 1828 Hep level 0.59 - in therapeutic range  Goal of Therapy:  Heparin level 0.3-0.7 units/ml Monitor platelets by anticoagulation protocol: Yes   Plan:  Continue  heparin infusion at 2300 units/hr Next Heparin level in 8 hr Daily CBC and heparin level F/u long term anticoag plans  Minda Ditto PharmD 09/20/2020, 7:31 PM

## 2020-09-21 DIAGNOSIS — E782 Mixed hyperlipidemia: Secondary | ICD-10-CM

## 2020-09-21 LAB — CBC
HCT: 34.4 % — ABNORMAL LOW (ref 39.0–52.0)
Hemoglobin: 11.5 g/dL — ABNORMAL LOW (ref 13.0–17.0)
MCH: 28 pg (ref 26.0–34.0)
MCHC: 33.4 g/dL (ref 30.0–36.0)
MCV: 83.7 fL (ref 80.0–100.0)
Platelets: 167 10*3/uL (ref 150–400)
RBC: 4.11 MIL/uL — ABNORMAL LOW (ref 4.22–5.81)
RDW: 14.9 % (ref 11.5–15.5)
WBC: 7.9 10*3/uL (ref 4.0–10.5)
nRBC: 0 % (ref 0.0–0.2)

## 2020-09-21 LAB — PROCALCITONIN: Procalcitonin: 0.23 ng/mL

## 2020-09-21 LAB — BASIC METABOLIC PANEL
Anion gap: 8 (ref 5–15)
BUN: 15 mg/dL (ref 8–23)
CO2: 23 mmol/L (ref 22–32)
Calcium: 8.4 mg/dL — ABNORMAL LOW (ref 8.9–10.3)
Chloride: 100 mmol/L (ref 98–111)
Creatinine, Ser: 1.12 mg/dL (ref 0.61–1.24)
GFR, Estimated: >60 mL/min (ref >60)
Glucose, Bld: 104 mg/dL — ABNORMAL HIGH (ref 70–99)
Potassium: 4 mmol/L (ref 3.5–5.1)
Sodium: 131 mmol/L — ABNORMAL LOW (ref 135–145)

## 2020-09-21 LAB — HEPARIN LEVEL (UNFRACTIONATED): Heparin Unfractionated: 0.64 IU/mL (ref 0.30–0.70)

## 2020-09-21 MED ORDER — FENOFIBRATE 145 MG PO TABS: 145.0000 mg | ORAL_TABLET | Freq: Every day | ORAL | Status: DC

## 2020-09-21 MED ORDER — HYDROCHLOROTHIAZIDE 25 MG PO TABS: 12.5000 mg | ORAL_TABLET | Freq: Every day | ORAL | Status: DC

## 2020-09-21 MED ORDER — ATORVASTATIN CALCIUM 10 MG PO TABS: 10.0000 mg | ORAL_TABLET | Freq: Every day | ORAL | Status: DC

## 2020-09-21 MED ORDER — APIXABAN 5 MG PO TABS
10.0000 mg | ORAL_TABLET | Freq: Two times a day (BID) | ORAL | Status: DC
  Administered 2020-09-21: 10 mg via ORAL
  Filled 2020-09-21: qty 2

## 2020-09-21 MED ORDER — OXYMETAZOLINE HCL 0.05 % NA SOLN: 2.0000 | Freq: Two times a day (BID) | NASAL | 0 refills | Status: DC | PRN

## 2020-09-21 MED ORDER — APIXABAN 5 MG PO TABS: ORAL_TABLET | ORAL | 0 refills | Status: DC

## 2020-09-21 MED ORDER — APIXABAN 5 MG PO TABS: 5.0000 mg | ORAL_TABLET | Freq: Two times a day (BID) | ORAL | Status: DC

## 2020-09-21 MED ORDER — SALINE SPRAY 0.65 % NA SOLN: 1.0000 | NASAL | 3 refills | Status: DC | PRN

## 2020-09-21 NOTE — Progress Notes (Signed)
ANTICOAGULATION CONSULT NOTE  Pharmacy Consult for IV heparin Indication: pulmonary embolus  No Known Allergies  Patient Measurements: Weight: 107.7 kg (237 lb 7 oz) Heparin Dosing Weight: 96 kg  Vital Signs: Temp: 98.3 F (36.8 C) (06/10 2045) BP: 154/69 (06/10 2045) Pulse Rate: 97 (06/10 2045)  Labs: Recent Labs    09/18/20 1550 09/19/20 0500 09/19/20 1647 09/20/20 0354 09/20/20 1828 09/21/20 0500  HGB 12.9* 11.9*  --  12.0*  --  11.5*  HCT 38.5* 35.5*  --  36.0*  --  34.4*  PLT 168 153  --  148*  --  167  HEPARINUNFRC  --   --    < > <0.10* 0.59 0.64  CREATININE 1.06 1.08  --  1.35*  --   --    < > = values in this interval not displayed.    Estimated Creatinine Clearance: 59.9 mL/min (A) (by C-G formula based on SCr of 1.35 mg/dL (H)).   Medications:  Scheduled:   aspirin EC  81 mg Oral q morning   atorvastatin  10 mg Oral Daily   fenofibrate  54 mg Oral Daily   fluticasone  2 spray Each Nare Daily   guaiFENesin  600 mg Oral BID   pantoprazole  40 mg Oral Daily   Infusions:   sodium chloride 75 mL/hr at 09/20/20 2355   heparin 2,300 Units/hr (09/20/20 2355)   PRN: acetaminophen, hydrALAZINE, ketorolac, ondansetron (ZOFRAN) IV, oxyCODONE  Assessment: 75 yo male presented to ER with chest/abd pain, noted to have CAP and now with new PE. Note also patient with hx thrombocytopenia - CBC currently good however with Hgb at 11.9 and plts at 153. Also note patient got a dose of SQ UFH 5000 units around 6am. Patient is reportedly on no anticoagulants prior to admission  Today, 09/21/2020: Heparin level = 0.64 with heparin gtt @ 2300 units/hr CBC: Hgb slightly low (11.5); Plt 536I  No complications of therapy noted Continues on ASA 81 mg daily  Goal of Therapy:  Heparin level 0.3-0.7 units/ml Monitor platelets by anticoagulation protocol: Yes   Plan:  Continue heparin infusion at 2300 units/hr Daily CBC and heparin level F/u long term anticoag  plans  Leone Haven, PharmD 09/21/2020, 5:42 AM

## 2020-09-21 NOTE — Discharge Summary (Addendum)
Discharge Summary  Shawn [name]:[id number] DOB: 12/18/1945  PCP: Tammi Sou, MD  Admit date: 09/18/2020 Discharge date: 09/21/2020  Time spent: 55mins, more than 50% time spent on coordination of care.   Recommendations for Outpatient Follow-up:  F/u with PCP within a week  for hospital discharge follow up, repeat cbc/bmp at follow up Refer to hematology/oncology for unprovoked PE F/u with ENT Dr Benjamine Mola for epistaxis  Started on eliquis, D/c asa  Discharge Diagnoses:  Active Hospital Problems   Diagnosis Date Noted   CAP (community acquired pneumonia) 09/18/2020   Pulmonary emboli ([place]) 09/19/2020   Pleuritic chest pain 09/18/2020   Hypertension, benign 01/04/2013   Mixed hyperlipidemia 01/04/2013   Esophageal reflux 01/04/2013    Resolved Hospital Problems  No resolved problems to display.    Discharge Condition: stable  Diet recommendation: heart healthy  Filed Weights   09/20/20 0640  Weight: 107.7 kg    History of present illness: ( per admitting provider Zierle-Ghosh)  Shawn [name]  is a 75 y.o. male, with history of thrombocytopenia, HTN, HLD, colon polyps, GERD, Chronic renal insufficiency, urinary retention, and more presents to the ER with chest/abdominal pain.  Patient reports that the pain started last night just after dinner.  He reports the more his right upper quadrant pain than the chest pain.  He describes it is just below the ribs on the right side, feeling like flatulence or pressure buildup.  He had no relief overnight.  He did have 2 loose bowel movements in the a.m. but not watery.  The pain has been constant.  Reports laying on his left side makes it worse, laying on his right side makes it better.  He noted a low-grade fever at home 100.2, with a forehead scanner.  He has noted shortness of breath since the pain started.  Deep inspiration also makes the pain worse.  He has had no cough.  He has had no leg swelling asymmetric or otherwise.  He  does wear TED hose at home.  He uses a cane for ambulation, but reports no change in his mobilization recently.  He has had no long trips, no history of cancer, is not on any hormones.  No recent procedures or surgeries.  Patient reports no sick contacts.  He has no other complaints at this time.   Patient does not smoke, does not drink alcohol, does not use illicit drugs.  He is vaccinated for COVID.  Patient reports he would like to be full code, his wife is power of attorney, he prefers no long-term life support.   In the Temp 98.2, heart rate 56-102, respiratory rate 14-30, blood pressure 169/84, satting at 95% UA is borderline, urine culture pending CT chest abdomen pelvis shows diffuse fatty infiltration of the liver, no acute abdominal changes.  Shows right lower lobe atelectasis versus infiltrate.  PE cannot be excluded.  Very small right pleural effusion. Chest x-ray shows a right nodular opacity that may represent an airspace consolidation or a pulmonary nodule. EKG shows sinus rhythm, heart rate 73, QTc 414, no ischemic changes No leukocytosis with a white blood cell count of 9.2, hemoglobin 12.9 Chemistry panel is unremarkable Zithromax and Rocephin started in the ED COVID pending Admission requested for observation, mostly to rule out PE in the a.m.  Patient cannot have more contrast today as he is already had CT chest abdomen pelvis with contrast.  D-dimer is borderline at 1.14.  CTA ordered for a.m.  Hospital Course:  Principal Problem:   CAP (community acquired pneumonia) Active Problems:   Esophageal reflux   Hypertension, benign   Mixed hyperlipidemia   Pleuritic chest pain   Pulmonary emboli ([facility])   Acute PE, does not appear provoked, unclear etiology -Patient presented with chest /abdominal pain/ tachypnea/dyspnea,  no hypoxia, blood pressure stable, no tachycardia -Multi segment pulmonary emboli in the right lower lobe with pulmonary infarct/ischemia.  CTA did not  mention right heart strain --echocardiogram LVEF 70 to 75% , Right ventricular systolic function is mildly reduced. The right ventricular size is normal. There is normal pulmonary artery systolic pressure -venous Doppler bilateral lower extremity no DVT -Procalcitonin less than 0.1, urine strep pneumo urine Legionella antigen negative , COVID-19 screen negative , chest x-ray findings likely due to PE, will hold further dose of antibiotics -he received heparin drip for 48hrs , symptom has improved, ambulating in hallway while talking on the phone without difficulty, patient desires to go home, heparin transitioned to eliquis, eliquis education provided by pharmacy, prescription sent . Likely will need at least 6-79months  treatment , d/c asa F/u with pcp Refer to hematology/oncology due to unproved PE  Addendum: He developed brief epistaxis, states with h/o intermittent epistaxis from left nare , mostly in the winter time,  Nasal saline spray and afrin prn prescribed He is advised to f/u with ENT Dr Benjamine Mola    Epigastric pain, resolved Nontender on exam, suspect referred pain from PE CT abdomen no acute findings On  omeprazole   AKI  Likely from dehydration due to poor oral intake He received hydration, home meds monopril/hctz held Cr improved, oral intake improved as well Resume monopril/HCTZ at discharge, f/u with pcp to repeat bmp     Hypertension ACE inhibitor /hctz held in the hospital due to [facility], resumed at discharge     Hyperlipidemia continue fenofibrate and statin  DVT prophylaxis: SCDs Start: 09/19/20 0354     Code Status: Full Family Communication: wife at bedside on 6/11 Disposition: home                  Consultants:  None   Procedures:  None   Antimicrobials:   Anti-infectives (From admission, onward)    Start     Dose/Rate Route Frequency Ordered Stop   09/19/20 2100  cefTRIAXone (ROCEPHIN) 2 g in sodium chloride 0.9 % 100 mL IVPB  Status:  Discontinued         2 g 200 mL/hr over 30 Minutes Intravenous Every 24 hours 09/19/20 0353 09/19/20 0934   09/19/20 2100  azithromycin (ZITHROMAX) 500 mg in sodium chloride 0.9 % 250 mL IVPB  Status:  Discontinued        500 mg 250 mL/hr over 60 Minutes Intravenous Every 24 hours 09/19/20 0353 09/19/20 0934   09/18/20 2015  cefTRIAXone (ROCEPHIN) 1 g in sodium chloride 0.9 % 100 mL IVPB        1 g 200 mL/hr over 30 Minutes Intravenous  Once 09/18/20 2009 09/18/20 2252   09/18/20 2015  azithromycin (ZITHROMAX) tablet 500 mg        500 mg Oral  Once 09/18/20 2009 09/18/20 2045         Discharge Exam: BP (!) 145/71   Pulse 72   Temp 99.6 F (37.6 C)   Resp 18   Wt 107.7 kg   SpO2 94%   BMI 34.07 kg/m   General: NAD Cardiovascular: RRR Respiratory: CTABL  Discharge Instructions You were cared for by a  hospitalist during your hospital stay. If you have any questions about your discharge medications or the care you received while you were in the hospital after you are discharged, you can call the unit and asked to speak with the hospitalist on call if the hospitalist that took care of you is not available. Once you are discharged, your primary care physician will handle any further medical issues. Please note that NO REFILLS for any discharge medications will be authorized once you are discharged, as it is imperative that you return to your primary care physician (or establish a relationship with a primary care physician if you do not have one) for your aftercare needs so that they can reassess your need for medications and monitor your lab values.  Discharge Instructions     Ambulatory referral to Hematology / Oncology   Complete by: As directed    Unprovoked PE   Diet - low sodium heart healthy   Complete by: As directed    Increase activity slowly   Complete by: As directed       Allergies as of 09/21/2020   No Known Allergies      Medication List     STOP taking these medications     aspirin EC 81 MG tablet       TAKE these medications    apixaban 5 MG Tabs tablet Commonly known as: ELIQUIS Start to take eliquis 10mg  bid for 13 doses , then start from 6/18 /22 take eliquis 5mg  bid   atorvastatin 10 MG tablet Commonly known as: LIPITOR Take 1 tablet (10 mg total) by mouth daily.   fenofibrate 145 MG tablet Commonly known as: TRICOR Take 1 tablet (145 mg total) by mouth daily.   fosinopril 20 MG tablet Commonly known as: MONOPRIL Take 1 tablet (20 mg total) by mouth every morning.   hydrochlorothiazide 25 MG tablet Commonly known as: HYDRODIURIL Take 0.5 tablets (12.5 mg total) by mouth daily.   omeprazole 40 MG capsule Commonly known as: PRILOSEC Take 40 mg by mouth daily.   oxymetazoline 0.05 % nasal spray Commonly known as: AFRIN Place 2 sprays into left nostril 2 (two) times daily as needed (nose bleed).   sodium chloride 0.65 % Soln nasal spray Commonly known as: OCEAN Place 1 spray into both nostrils as needed for congestion.   SYSTANE OP Place 1 drop into both eyes as needed (dry eyes).       No Known Allergies  Follow-up Information     McGowen, Adrian Blackwater, MD Follow up in 1 week(s).   Specialty: Family Medicine Why: hospital discharge follow up, repeat basic lab works including cbc/bmp. please work with your pcp for cancer screening Contact information: [address] [place] [address] [facility] Follow up in 3 week(s).   Specialty: Oncology Why: for unprovoked PE Contact information: [place] [id number] [place] [phone]        Leta Baptist, MD Follow up.   Specialty: Otolaryngology Why: for nose bleed Contact information: [place] [address] [phone]                  The results of significant diagnostics from this hospitalization (including imaging, microbiology,  ancillary and laboratory) are listed below for reference.    Significant Diagnostic Studies: DG Chest 2 View  Result Date: 09/18/2020 CLINICAL DATA:  Abdominal  pain since last night. EXAM: CHEST - 2 VIEW COMPARISON:  September 18, 2020 FINDINGS: The cardiac silhouette is mildly enlarged.  Tortuosity of the aorta. Nodular opacity in the right lower lung field may represent mild peribronchial airspace consolidation or potentially a pulmonary nodule. Osseous structures are without acute abnormality. Soft tissues are grossly normal. IMPRESSION: Nodular opacity in the right lower lung field may represent mild peribronchial airspace consolidation or potentially a pulmonary nodule. Follow-up with nonemergent chest CT may be considered. Electronically Signed   By: Fidela Salisbury M.D.   On: 09/18/2020 17:04   CT Chest W Contrast  Result Date: 09/18/2020 CLINICAL DATA:  Abdominal pain and fever. EXAM: CT CHEST, ABDOMEN, AND PELVIS WITH CONTRAST TECHNIQUE: Multidetector CT imaging of the chest, abdomen and pelvis was performed following the standard protocol during bolus administration of intravenous contrast. CONTRAST:  156mL OMNIPAQUE IOHEXOL 300 MG/ML  SOLN COMPARISON:  None. FINDINGS: CT CHEST FINDINGS Cardiovascular: There is mild calcification of the aortic arch. The pulmonary arteries are markedly limited in evaluation secondary to suboptimal opacification with intravenous contrast. Intraluminal low attenuation is noted involving posteromedial lower lobe branches of the right pulmonary artery (axial CT images 42 through 46, CT series 2). Normal heart size. No pericardial effusion. Mediastinum/Nodes: No enlarged mediastinal, hilar, or axillary lymph nodes. Thyroid gland, trachea, and esophagus demonstrate no significant findings. Lungs/Pleura: Mild-to-moderate severity atelectasis and/or infiltrate is seen within the posterior aspect of the right lower lobe. A very small right pleural effusion is seen.  Subcentimeter, pleural based, partially calcified nodules are seen within the anterior aspect of the right upper lobe. No pneumothorax is identified. Musculoskeletal: No chest wall mass or suspicious bone lesions identified. CT ABDOMEN PELVIS FINDINGS Hepatobiliary: There is diffuse fatty infiltration of the liver parenchyma. No focal liver abnormality is seen. No gallstones, gallbladder wall thickening, or biliary dilatation. Pancreas: Unremarkable. No pancreatic ductal dilatation or surrounding inflammatory changes. Spleen: Normal in size without focal abnormality. Adrenals/Urinary Tract: Adrenal glands are unremarkable. Kidneys are normal in size, without renal calculi or hydronephrosis. A 2.9 cm x 2.6 cm cyst is seen within the anterior aspect of the mid left kidney. Bladder is unremarkable. Stomach/Bowel: Stomach is within normal limits. The appendix is surgically absent. No evidence of bowel wall thickening, distention, or inflammatory changes. Vascular/Lymphatic: Aortic atherosclerosis. No enlarged abdominal or pelvic lymph nodes. Reproductive: The prostate gland is mildly enlarged. Other: A 3.3 cm x 3.2 cm fat containing right inguinal hernia is seen. No abdominopelvic ascites. Musculoskeletal: No acute or significant osseous findings. IMPRESSION: 1. Mild to moderate severity right lower lobe atelectasis and/or infiltrate. 2. Limited evaluation of the pulmonary arteries with intraluminal low attenuation seen involving the lower lobe branches of the right pulmonary artery. A mild amount of pulmonary embolism cannot be excluded. Correlation with follow-up chest CTA versus nuclear medicine ventilation/perfusion scan is recommended. 3. Very small right pleural effusion. 4. Benign-appearing partially calcified pleural base nodules, as described above. 5. Simple left renal cyst. Electronically Signed   By: Virgina Norfolk M.D.   On: 09/18/2020 18:27   CT Angio Chest Pulmonary Embolism (PE) W or WO  Contrast  Result Date: 09/19/2020 CLINICAL DATA:  Epigastric and right upper quadrant pain abnormal chest x-ray EXAM: CT ANGIOGRAPHY CHEST WITH CONTRAST TECHNIQUE: Multidetector CT imaging of the chest was performed using the standard protocol during bolus administration of intravenous contrast. Multiplanar CT image reconstructions and MIPs were obtained to evaluate the vascular anatomy. CONTRAST:  153mL OMNIPAQUE IOHEXOL 350 MG/ML SOLN COMPARISON:  Chest CT from yesterday FINDINGS: Cardiovascular: Multiple branching segmental size pulmonary artery filling defects in the right lower lobe. No right heart strain by RV to LV ratio. Normal heart size. No pericardial effusion. Mediastinum/Nodes: Negative for adenopathy or mass. Lungs/Pleura: Ground-glass opacity and streaky density with volume loss in the right lower lobe which is considered ischemic. There is a small right pleural effusion considered sympathetic. Mild left lower lobe atelectasis. No pulmonary edema. Upper Abdomen: Negative Musculoskeletal: Negative Review of the MIP images confirms the above findings. Critical Value/emergent results were called by telephone at the time of interpretation on 09/19/2020 at 8:05 am to provider Dr Erlinda Hong, who verbally acknowledged these results. IMPRESSION: Multi segment pulmonary emboli in the right lower lobe with pulmonary infarct/ischemia. Electronically Signed   By: Monte Fantasia M.D.   On: 09/19/2020 08:07   CT Abdomen Pelvis W Contrast  Result Date: 09/18/2020 CLINICAL DATA:  Abdominal pain and fever. EXAM: CT CHEST, ABDOMEN, AND PELVIS WITH CONTRAST TECHNIQUE: Multidetector CT imaging of the chest, abdomen and pelvis was performed following the standard protocol during bolus administration of intravenous contrast. CONTRAST:  166mL OMNIPAQUE IOHEXOL 300 MG/ML  SOLN COMPARISON:  None. FINDINGS: CT CHEST FINDINGS Cardiovascular: There is mild calcification of the aortic arch. The pulmonary arteries are markedly limited  in evaluation secondary to suboptimal opacification with intravenous contrast. Intraluminal low attenuation is noted involving posteromedial lower lobe branches of the right pulmonary artery (axial CT images 42 through 46, CT series 2). Normal heart size. No pericardial effusion. Mediastinum/Nodes: No enlarged mediastinal, hilar, or axillary lymph nodes. Thyroid gland, trachea, and esophagus demonstrate no significant findings. Lungs/Pleura: Mild-to-moderate severity atelectasis and/or infiltrate is seen within the posterior aspect of the right lower lobe. A very small right pleural effusion is seen. Subcentimeter, pleural based, partially calcified nodules are seen within the anterior aspect of the right upper lobe. No pneumothorax is identified. Musculoskeletal: No chest wall mass or suspicious bone lesions identified. CT ABDOMEN PELVIS FINDINGS Hepatobiliary: There is diffuse fatty infiltration of the liver parenchyma. No focal liver abnormality is seen. No gallstones, gallbladder wall thickening, or biliary dilatation. Pancreas: Unremarkable. No pancreatic ductal dilatation or surrounding inflammatory changes. Spleen: Normal in size without focal abnormality. Adrenals/Urinary Tract: Adrenal glands are unremarkable. Kidneys are normal in size, without renal calculi or hydronephrosis. A 2.9 cm x 2.6 cm cyst is seen within the anterior aspect of the mid left kidney. Bladder is unremarkable. Stomach/Bowel: Stomach is within normal limits. The appendix is surgically absent. No evidence of bowel wall thickening, distention, or inflammatory changes. Vascular/Lymphatic: Aortic atherosclerosis. No enlarged abdominal or pelvic lymph nodes. Reproductive: The prostate gland is mildly enlarged. Other: A 3.3 cm x 3.2 cm fat containing right inguinal hernia is seen. No abdominopelvic ascites. Musculoskeletal: No acute or significant osseous findings. IMPRESSION: 1. Mild to moderate severity right lower lobe atelectasis and/or  infiltrate. 2. Limited evaluation of the pulmonary arteries with intraluminal low attenuation seen involving the lower lobe branches of the right pulmonary artery. A mild amount of pulmonary embolism cannot be excluded. Correlation with follow-up chest CTA versus nuclear medicine ventilation/perfusion scan is recommended. 3. Very small right pleural effusion. 4. Benign-appearing partially calcified pleural base nodules, as described above. 5. Simple left renal cyst. Electronically Signed   By: Virgina Norfolk M.D.   On: 09/18/2020 18:27   ECHOCARDIOGRAM COMPLETE  Result Date: 09/19/2020    ECHOCARDIOGRAM REPORT   Patient Name:   Shawn [name] Date of Exam: 09/19/2020 Medical Rec #:  [id number]     Height:       70.0 in Accession #:    [id number]    Weight:       220.4 lb Date of Birth:  06-25-1945     BSA:          2.175 m Patient Age:    44 years      BP:           142/86 mmHg Patient Gender: M             HR:           75 bpm. Exam Location:  [facility] Procedure: 2D Echo, Cardiac Doppler and Color Doppler Indications:    Pulmonary Embolus  History:        Patient has no prior history of Echocardiogram examinations.                 Signs/Symptoms:Shortness of Breath and Dyspnea; Risk                 Factors:Hypertension and Dyslipidemia.  Sonographer:    Dustin Flock Referring Phys: [phone] [place]  1. Left ventricular ejection fraction, by estimation, is 70 to 75%. The left ventricle has hyperdynamic function. The left ventricle has no regional wall motion abnormalities. Left ventricular diastolic parameters were normal.  2. Right ventricular systolic function is mildly reduced. The right ventricular size is normal. There is normal pulmonary artery systolic pressure.  3. Trivial mitral valve regurgitation.  4. The aortic valve is tricuspid. Aortic valve regurgitation is not visualized. Mild aortic valve sclerosis is present, with no evidence of aortic valve stenosis.  5. The inferior vena cava  is normal in size with greater than 50% respiratory variability, suggesting right atrial pressure of 3 mmHg. FINDINGS  Left Ventricle: Left ventricular ejection fraction, by estimation, is 70 to 75%. The left ventricle has hyperdynamic function. The left ventricle has no regional wall motion abnormalities. The left ventricular internal cavity size was normal in size. There is no left ventricular hypertrophy. Left ventricular diastolic parameters were normal. Right Ventricle: The right ventricular size is normal. Right vetricular wall thickness was not assessed. Right ventricular systolic function is mildly reduced. There is normal pulmonary artery systolic pressure. The tricuspid regurgitant velocity is 2.20  m/s, and with an assumed right atrial pressure of 3 mmHg, the estimated right ventricular systolic pressure is 79.8 mmHg. Left Atrium: Left atrial size was normal in size. Right Atrium: Right atrial size was normal in size. Pericardium: There is no evidence of pericardial effusion. Mitral Valve: There is mild thickening of the mitral valve leaflet(s). Mild to moderate mitral annular calcification. Trivial mitral valve regurgitation. Tricuspid Valve: The tricuspid valve is normal in structure. Tricuspid valve regurgitation is trivial. Aortic Valve: The aortic valve is tricuspid. Aortic valve regurgitation is not visualized. Mild aortic valve sclerosis is present, with no evidence of aortic valve stenosis. Pulmonic Valve: The pulmonic valve was normal in structure. Pulmonic valve regurgitation is not visualized. Aorta: The aortic root is normal in size and structure. Venous: The inferior vena cava is normal in size with greater than 50% respiratory variability, suggesting right atrial pressure of 3 mmHg. IAS/Shunts: No atrial level shunt detected by color flow Doppler.  LEFT VENTRICLE PLAX 2D LVIDd:         4.70 cm  Diastology LVIDs:         2.70 cm  LV e' medial:    7.40 cm/s LV PW:  1.20 cm  LV E/e'  medial:  13.3 LV IVS:        1.10 cm  LV e' lateral:   9.36 cm/s LVOT diam:     2.40 cm  LV E/e' lateral: 10.5 LV SV:         126 LV SV Index:   58 LVOT Area:     4.52 cm  RIGHT VENTRICLE RV Basal diam:  3.40 cm RV S prime:     5.66 cm/s TAPSE (M-mode): 1.6 cm LEFT ATRIUM             Index       RIGHT ATRIUM           Index LA diam:        3.70 cm 1.70 cm/m  RA Area:     17.90 cm LA Vol (A2C):   34.2 ml 15.72 ml/m RA Volume:   48.60 ml  22.34 ml/m LA Vol (A4C):   33.2 ml 15.26 ml/m LA Biplane Vol: 35.6 ml 16.37 ml/m  AORTIC VALVE LVOT Vmax:   147.00 cm/s LVOT Vmean:  94.600 cm/s LVOT VTI:    0.279 m  AORTA Ao Root diam: 3.20 cm MITRAL VALVE                TRICUSPID VALVE MV Area (PHT): 3.60 cm     TR Peak grad:   19.4 mmHg MV Decel Time: 211 msec     TR Vmax:        220.00 cm/s MV E velocity: 98.50 cm/s MV A velocity: 107.00 cm/s  SHUNTS MV E/A ratio:  0.92         Systemic VTI:  0.28 m                             Systemic Diam: 2.40 cm Dorris Carnes MD Electronically signed by Dorris Carnes MD Signature Date/Time: 09/19/2020/10:16:46 AM    Final    VAS [place] LOWER EXTREMITY VENOUS (DVT)  Result Date: 09/19/2020  Lower Venous DVT Study Patient Name:  Shawn [name]  Date of Exam:   09/19/2020 Medical Rec #: [id number]      Accession #:    [id number] Date of Birth: Sep 12, 1945      Patient Gender: M Patient Age:   75Y Exam Location:  [facility] Procedure:      VAS [place] LOWER EXTREMITY VENOUS (DVT) Referring Phys: [id number] Jariah Tarkowski --------------------------------------------------------------------------------  Indications: Pulmonary embolism, and SOB.  Comparison Study: No previous exams Performing Technologist: Jody Hill RVT, RDMS  Examination Guidelines: A complete evaluation includes B-mode imaging, spectral Doppler, color Doppler, and power Doppler as needed of all accessible portions of each vessel. Bilateral testing is considered an integral part of a complete examination. Limited examinations for reoccurring  indications may be performed as noted. The reflux portion of the exam is performed with the patient in reverse Trendelenburg.  +---------+---------------+---------+-----------+----------+--------------+ RIGHT    CompressibilityPhasicitySpontaneityPropertiesThrombus Aging +---------+---------------+---------+-----------+----------+--------------+ CFV      Full           Yes      Yes                                 +---------+---------------+---------+-----------+----------+--------------+ SFJ      Full                                                        +---------+---------------+---------+-----------+----------+--------------+  FV Prox  Full           Yes      Yes                                 +---------+---------------+---------+-----------+----------+--------------+ FV Mid   Full           Yes      Yes                                 +---------+---------------+---------+-----------+----------+--------------+ FV DistalFull           Yes      Yes                                 +---------+---------------+---------+-----------+----------+--------------+ PFV      Full                                                        +---------+---------------+---------+-----------+----------+--------------+ POP      Full           Yes      Yes                                 +---------+---------------+---------+-----------+----------+--------------+ PTV      Full                                                        +---------+---------------+---------+-----------+----------+--------------+ PERO     Full                                                        +---------+---------------+---------+-----------+----------+--------------+   +---------+---------------+---------+-----------+----------+--------------+ LEFT     CompressibilityPhasicitySpontaneityPropertiesThrombus Aging  +---------+---------------+---------+-----------+----------+--------------+ CFV      Full           Yes      Yes                                 +---------+---------------+---------+-----------+----------+--------------+ SFJ      Full                                                        +---------+---------------+---------+-----------+----------+--------------+ FV Prox  Full           Yes      Yes                                 +---------+---------------+---------+-----------+----------+--------------+ FV Mid   Full  Yes      Yes                                 +---------+---------------+---------+-----------+----------+--------------+ FV DistalFull           Yes      Yes                                 +---------+---------------+---------+-----------+----------+--------------+ PFV      Full                                                        +---------+---------------+---------+-----------+----------+--------------+ POP      Full           Yes      Yes                                 +---------+---------------+---------+-----------+----------+--------------+ PTV      Full                                                        +---------+---------------+---------+-----------+----------+--------------+ PERO     Full                                                        +---------+---------------+---------+-----------+----------+--------------+     Summary: BILATERAL: - No evidence of deep vein thrombosis seen in the lower extremities, bilaterally. - No evidence of superficial venous thrombosis in the lower extremities, bilaterally. -No evidence of popliteal cyst, bilaterally.   *See table(s) above for measurements and observations. Electronically signed by Monica Martinez MD on 09/19/2020 at 7:11:32 PM.    Final     Microbiology: Recent Results (from the past 240 hour(s))  Urine culture     Status: None   Collection Time: 09/18/20   7:52 PM   Specimen: Urine, Clean Catch  Result Value Ref Range Status   Specimen Description   Final    URINE, CLEAN CATCH Performed at [facility] [address] [place], [place] [address]    Special Requests   Final    NONE Performed at [facility], [place] [address] [place], [place] [address]    Culture   Final    NO GROWTH Performed at [facility], [place] [address] [place], [place] [address]    Report Status 09/20/2020 FINAL  Final  SARS CORONAVIRUS 2 (TAT 6-24 HRS) Nasopharyngeal Nasopharyngeal Swab     Status: None   Collection Time: 09/18/20  8:09 PM   Specimen: Nasopharyngeal Swab  Result Value Ref Range Status   SARS Coronavirus 2 NEGATIVE NEGATIVE Final    Comment: (NOTE) SARS-CoV-2 target nucleic acids are NOT DETECTED.  The SARS-CoV-2 RNA is generally detectable in upper and lower respiratory specimens during the acute phase of infection. Negative  results do not preclude SARS-CoV-2 infection, do not rule out co-infections with other pathogens, and should not be used as the sole basis for treatment or other patient management decisions. Negative results must be combined with clinical observations, patient history, and epidemiological information. The expected result is Negative.  Fact Sheet for Patients: SugarRoll.be  Fact Sheet for Healthcare Providers: https://www.woods-mathews.com/  This test is not yet approved or cleared by the [place] FDA and  has been authorized for detection and/or diagnosis of SARS-CoV-2 by FDA under an Emergency Use Authorization (EUA). This EUA will remain  in effect (meaning this test can be used) for the duration of the COVID-19 declaration under Se ction 564(b)(1) of the Act, 21 U.S.C. section 360bbb-3(b)(1), unless the authorization is terminated or revoked sooner.  Performed at [facility], [place] [address], [place],  [place] [address]   Culture, blood (routine x 2)     Status: None (Preliminary result)   Collection Time: 09/19/20  6:06 PM   Specimen: BLOOD  Result Value Ref Range Status   Specimen Description   Final    BLOOD RIGHT ANTECUBITAL Performed at [facility] [address], [place], [place] [address]    Special Requests   Final    BOTTLES DRAWN AEROBIC AND ANAEROBIC Blood Culture adequate volume Performed at [facility] [address], [place], [place] [address]    Culture   Final    NO GROWTH 2 DAYS Performed at [facility] [address], [place], [place] [address]    Report Status PENDING  Incomplete  Culture, blood (routine x 2)     Status: None (Preliminary result)   Collection Time: 09/19/20  6:06 PM   Specimen: BLOOD LEFT WRIST  Result Value Ref Range Status   Specimen Description   Final    BLOOD LEFT WRIST Performed at [facility], [place] [address], [place], [place] [address]    Special Requests   Final    BOTTLES DRAWN AEROBIC AND ANAEROBIC Blood Culture adequate volume Performed at [facility] [address] [place], [place] [address]    Culture   Final    NO GROWTH 2 DAYS Performed at [facility] [address] [place], [place] [address]    Report Status PENDING  Incomplete     Labs: Basic Metabolic Panel: Recent Labs  Lab 09/18/20 1550 09/19/20 0500 09/20/20 0354 09/21/20 0500  NA 133* 131* 129* 131*  K 3.6 3.4* 3.6 4.0  CL 99 98 97* 100  CO2 25 26 25 23   GLUCOSE 112* 146* 107* 104*  BUN 16 17 23 15   CREATININE 1.06 1.08 1.35* 1.12  CALCIUM 9.5 9.4 8.8* 8.4*  MG  --  1.9 2.2  --    Liver Function Tests: Recent Labs  Lab 09/18/20 1550 09/19/20 0500  AST 28 23  ALT 21 19  ALKPHOS 40 36*  BILITOT 0.8 0.8  PROT 7.7 7.0  ALBUMIN 4.3 3.5   Recent Labs  Lab 09/18/20 1550  LIPASE 25   No results for input(s): AMMONIA in the last 168 hours. CBC: Recent Labs  Lab  09/18/20 1550 09/19/20 0500 09/20/20 0354 09/21/20 0500  WBC 9.2 8.9 8.6 7.9  NEUTROABS 6.8 6.6  --   --   HGB 12.9* 11.9* 12.0* 11.5*  HCT 38.5* 35.5* 36.0* 34.4*  MCV 83.5 83.7 83.5 83.7  PLT 168 153 148* 167   Cardiac Enzymes: No  results for input(s): CKTOTAL, CKMB, CKMBINDEX, TROPONINI in the last 168 hours. BNP: BNP (last 3 results) No results for input(s): BNP in the last 8760 hours.  ProBNP (last 3 results) No results for input(s): PROBNP in the last 8760 hours.  CBG: No results for input(s): GLUCAP in the last 168 hours.     Signed:  Florencia Reasons MD, PhD, FACP  [facility] 09/21/2020, 5:08 PM

## 2020-09-21 NOTE — Discharge Instructions (Addendum)
Information on my medicine - ELIQUIS (apixaban)  Why was Eliquis prescribed for you? Eliquis was prescribed to treat blood clots that may have been found in the veins of your legs (deep vein thrombosis) or in your lungs (pulmonary embolism) and to reduce the risk of them occurring again.  What do You need to know about Eliquis ? The starting dose is 10 mg (two 5 mg tablets) taken TWICE daily for the FIRST SEVEN (7) DAYS, then on (enter date)  09/28/20  the dose is reduced to ONE 5 mg tablet taken TWICE daily.  Eliquis may be taken with or without food.   Try to take the dose about the same time in the morning and in the evening. If you have difficulty swallowing the tablet whole please discuss with your pharmacist how to take the medication safely.  Take Eliquis exactly as prescribed and DO NOT stop taking Eliquis without talking to the doctor who prescribed the medication.  Stopping may increase your risk of developing a new blood clot.  Refill your prescription before you run out.  After discharge, you should have regular check-up appointments with your healthcare provider that is prescribing your Eliquis.    What do you do if you miss a dose? If a dose of ELIQUIS is not taken at the scheduled time, take it as soon as possible on the same day and twice-daily administration should be resumed. The dose should not be doubled to make up for a missed dose.  Important Safety Information A possible side effect of Eliquis is bleeding. You should call your healthcare provider right away if you experience any of the following: Bleeding from an injury or your nose that does not stop. Unusual colored urine (red or dark brown) or unusual colored stools (red or black). Unusual bruising for unknown reasons. A serious fall or if you hit your head (even if there is no bleeding).  Some medicines may interact with Eliquis and might increase your risk of bleeding or clotting while on Eliquis. To  help avoid this, consult your healthcare provider or pharmacist prior to using any new prescription or non-prescription medications, including herbals, vitamins, non-steroidal anti-inflammatory drugs (NSAIDs) and supplements.  This website has more information on Eliquis (apixaban): http://www.eliquis.com/eliquis/home

## 2020-09-21 NOTE — Progress Notes (Signed)
[facility] for IV heparin >> apixaban  Indication: pulmonary embolus  No Known Allergies  Patient Measurements: Weight: 107.7 kg (237 lb 7 oz) Heparin Dosing Weight: 96 kg  Vital Signs: Temp: 99.2 F (37.3 C) (06/11 0545) BP: 141/70 (06/11 0545) Pulse Rate: 76 (06/11 0545)  Labs: Recent Labs    09/19/20 0500 09/19/20 1647 09/20/20 0354 09/20/20 1828 09/21/20 0500  HGB 11.9*  --  12.0*  --  11.5*  HCT 35.5*  --  36.0*  --  34.4*  PLT 153  --  148*  --  167  HEPARINUNFRC  --    < > <0.10* 0.59 0.64  CREATININE 1.08  --  1.35*  --  1.12   < > = values in this interval not displayed.    Estimated Creatinine Clearance: 72.2 mL/min (by C-G formula based on SCr of 1.12 mg/dL).   Medications:  Scheduled:   apixaban  10 mg Oral BID   Followed by   Derrill Memo ON 09/28/2020] apixaban  5 mg Oral BID   aspirin EC  81 mg Oral q morning   atorvastatin  10 mg Oral Daily   fenofibrate  54 mg Oral Daily   fluticasone  2 spray Each Nare Daily   guaiFENesin  600 mg Oral BID   pantoprazole  40 mg Oral Daily   Infusions:   heparin 2,300 Units/hr (09/21/20 1031)   PRN: acetaminophen, hydrALAZINE, ketorolac, ondansetron (ZOFRAN) IV, oxyCODONE  Assessment: 75 yo male presented to ER with chest/abd pain, noted to have CAP and now with new PE. Note also patient with hx thrombocytopenia - Patient is reportedly not on any anticoagulants prior to admission. Patient started on IV heparin drip on admission, pharmacy consulted to transition anticoagulation to apixaban on 6/11.   Today, 09/21/2020: Heparin level = 0.64 is therapeutic on heparin drip @ 2300 units/hr CBC: Hgb slightly low (11.5); Plt 167K WNL Continues on ASA 81 mg daily  Goal of Therapy:  Heparin level 0.3-0.7 units/ml Monitor platelets by anticoagulation protocol: Yes   Plan:  Discontinue heparin drip at time of apixaban initiation Apixaban 10 mg PO BID x7 days followed by apixaban 5 mg  PO  BID Follow CBC  Lenis Noon, PharmD 09/21/20 11:06 AM

## 2020-09-21 NOTE — Progress Notes (Signed)
PT Cancellation Note  Patient Details Name: Shawn [name] MRN: [id number] DOB: 04-Feb-1946   Cancelled Treatment:    Reason Eval/Treat Not Completed: PT screened, no needs identified, will sign off. Pt scheduled to d/c.  Spoke to charge nurse who stated pt has been walking the halls. Spoke to pt and he was walking around room and didn't feel he needed a PT eval.  Will sign off.   Galen Manila 09/21/2020, 11:28 AM

## 2020-09-23 ENCOUNTER — Encounter: Payer: Self-pay | Admitting: Family Medicine

## 2020-09-23 LAB — LEGIONELLA PNEUMOPHILA SEROGP 1 UR AG: L. pneumophila Serogp 1 Ur Ag: NEGATIVE

## 2020-09-24 LAB — CULTURE, BLOOD (ROUTINE X 2)
Culture: NO GROWTH
Culture: NO GROWTH
Special Requests: ADEQUATE
Special Requests: ADEQUATE

## 2020-09-30 ENCOUNTER — Encounter: Payer: Self-pay | Admitting: Family Medicine

## 2020-09-30 ENCOUNTER — Ambulatory Visit (INDEPENDENT_AMBULATORY_CARE_PROVIDER_SITE_OTHER): Payer: Medicare Other | Admitting: Family Medicine

## 2020-09-30 ENCOUNTER — Other Ambulatory Visit: Payer: Self-pay

## 2020-09-30 VITALS — BP 124/72 | HR 68 | Temp 97.8°F | Ht 70.0 in | Wt 213.0 lb

## 2020-09-30 DIAGNOSIS — Z7901 Long term (current) use of anticoagulants: Secondary | ICD-10-CM

## 2020-09-30 DIAGNOSIS — I10 Essential (primary) hypertension: Secondary | ICD-10-CM

## 2020-09-30 DIAGNOSIS — N179 Acute kidney failure, unspecified: Secondary | ICD-10-CM | POA: Diagnosis not present

## 2020-09-30 DIAGNOSIS — I2699 Other pulmonary embolism without acute cor pulmonale: Secondary | ICD-10-CM

## 2020-09-30 LAB — BASIC METABOLIC PANEL
BUN: 17 mg/dL (ref 6–23)
CO2: 24 mEq/L (ref 19–32)
Calcium: 9 mg/dL (ref 8.4–10.5)
Chloride: 96 mEq/L (ref 96–112)
Creatinine, Ser: 1.04 mg/dL (ref 0.40–1.50)
GFR: 71.29 mL/min (ref >60.00)
Glucose, Bld: 84 mg/dL (ref 70–99)
Potassium: 4.1 mEq/L (ref 3.5–5.1)
Sodium: 131 mEq/L — ABNORMAL LOW (ref 135–145)

## 2020-09-30 LAB — CBC
HCT: 36.4 % — ABNORMAL LOW (ref 39.0–52.0)
Hemoglobin: 12.1 g/dL — ABNORMAL LOW (ref 13.0–17.0)
MCHC: 33.3 g/dL (ref 30.0–36.0)
MCV: 82.4 fl (ref 78.0–100.0)
Platelets: 370 10*3/uL (ref 150.0–400.0)
RBC: 4.41 Mil/uL (ref 4.22–5.81)
RDW: 14.9 % (ref 11.5–15.5)
WBC: 7.6 10*3/uL (ref 4.0–10.5)

## 2020-09-30 MED ORDER — FENOFIBRATE 145 MG PO TABS: 145.0000 mg | ORAL_TABLET | Freq: Every day | ORAL | 3 refills | Status: DC

## 2020-09-30 MED ORDER — APIXABAN 5 MG PO TABS: ORAL_TABLET | ORAL | 1 refills | Status: DC

## 2020-09-30 MED ORDER — HYDROCHLOROTHIAZIDE 25 MG PO TABS: 12.5000 mg | ORAL_TABLET | Freq: Every day | ORAL | 3 refills | Status: DC

## 2020-09-30 MED ORDER — ATORVASTATIN CALCIUM 10 MG PO TABS: 10.0000 mg | ORAL_TABLET | Freq: Every day | ORAL | 3 refills | Status: DC

## 2020-09-30 NOTE — Progress Notes (Signed)
09/30/2020  CC:  Chief Complaint  Patient presents with   Hospitalization Follow-up    Patient is a 75 y.o. Caucasian male who presents for  hospital follow up. Dates of admission to hospital: 6/8-6/11, 2022. Days since d/c home: 9 days Patient was discharged from hospital to home. Reason for admission to hospital: presented to ED with 24h RUQ abd pain, CP, and SOB/unable to take deep breaths. CT angio 09/19/20 showed multisegment PE's in RLL with pulm infarct/ischemia.  Also had some findings on imaging c/w atelectasis vs pneumonia.   I have reviewed patient's discharge summary plus pertinent specific notes, labs, and imaging from the hospitalization.   He had LE dopplers NEG for DVT.  No known provoking factors for his PE's.  Had AKI that resolved in hops with hydration and holding of monopril/hctz--this med was resumed at d/c.  No abx were needed. He was d/c'd home on eliquis 5 mg bid and his ASA was d/c'd.  Outpt hem/onc referral recommended for further eval of unprovoked PE.  CURRENTLY: Doing much better regarding CP, SOB, RUQ pain--mostly just positional discomfort when he lies down.  Still having some coughing---not new for him.  No hemoptysis or purulent mucous production.  NO fevers.  Appetite ok since going home, a bit tired still. No bleeding since d/c home.   Has chronic L leg pain mostly in knee, some instability issues with this, uses a cane and it is helpful.  Medication reconciliation was done today and patient is taking meds as recommended by discharging hospitalist/specialist.    ROS as above, plus--> no wheezing,  no dizziness, no HAs, no rashes, no melena/hematochezia.  No polyuria or polydipsia.  No myalgias or arthralgias.  No focal weakness, paresthesias, or tremors.  No acute vision or hearing abnormalities.  No dysuria or unusual/new urinary urgency or frequency.  No recent changes in lower legs. No n/v/d or abd pain.  No palpitations.    PMH:  Past Medical  History:  Diagnosis Date   Acute urinary retention 12/2018   ?BPH   Asymmetrical sensorineural hearing loss 2021   Likely from noise exposure.  [facility] [phone] ENT referral for this dx being relatively new.   Barrett's esophagus    Dr. Collene Mares   Cataract    Chronic renal insufficiency, stage 2 (mild) 2017   Stage II/III (GFR 60 ml/min)   GERD (gastroesophageal reflux disease)    History of adenomatous polyp of colon 09/2007   Polyps at 1st screening colonoscopy; 2014 no polyps.  Rpt 12/29/17: adenomatous polyp-->  Recall 5 yrs.   Hyperlipemia, mixed    Pt intol of welchol; fenofibrate helpful.  Fenofib inc to 145 and atorva 10mg  started 08/23/18-->great lipid panel 11/2018.   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  Unprovoked->hosp ref to hem/onc   Subclinical hypothyroidism    per old PCP records.  TPO ab's elevated mildly 12/2017, with TSH 6.6 and normal T4 and T3==>developing Hashimoto's.    Thrombocytopenia ([place])    Hx of, mild/stable with monitoring per old PCP records    PSH:  Past Surgical History:  Procedure Laterality Date   APPENDECTOMY     CATARACT EXTRACTION Bilateral 04/13/2018   05/14/2018   COLONOSCOPY  12/29/2017   polypectomy --path - adenoma,  Repeat in 5 years   COLONOSCOPY W/ POLYPECTOMY  2014   per Dr. Collene Mares, recall 02/2023   ESOPHAGOGASTRODUODENOSCOPY  2014; 2016; 11/16/16   2018--Barrett's esoph reconfirmed   TRANSTHORACIC  ECHOCARDIOGRAM  09/19/2020   (in the setting of acute PE)->EF 70-75%, RV fxn mildly reduced, hyperdynamic LV fxn, normal PA pressure, no valve probs.   UPPER GI ENDOSCOPY  03/03/2019    MEDS:  Outpatient Medications Prior to Visit  Medication Sig Dispense Refill   apixaban (ELIQUIS) 5 MG TABS tablet Start to take eliquis 10mg  bid for 13 doses , then start from 6/18 /22 take eliquis 5mg  bid 73 tablet 0   atorvastatin (LIPITOR) 10 MG tablet Take 1 tablet (10 mg total) by mouth daily.      fenofibrate (TRICOR) 145 MG tablet Take 1 tablet (145 mg total) by mouth daily.     fosinopril (MONOPRIL) 20 MG tablet Take 1 tablet (20 mg total) by mouth every morning. 90 tablet 3   hydrochlorothiazide (HYDRODIURIL) 25 MG tablet Take 0.5 tablets (12.5 mg total) by mouth daily.     omeprazole (PRILOSEC) 40 MG capsule Take 40 mg by mouth daily.     Polyethyl Glycol-Propyl Glycol (SYSTANE OP) Place 1 drop into both eyes as needed (dry eyes).     oxymetazoline (AFRIN) 0.05 % nasal spray Place 2 sprays into left nostril 2 (two) times daily as needed (nose bleed). (Patient not taking: Reported on 09/30/2020) 15 mL 0   sodium chloride (OCEAN) 0.65 % SOLN nasal spray Place 1 spray into both nostrils as needed for congestion. (Patient not taking: Reported on 09/30/2020) 1 mL 3   No facility-administered medications prior to visit.   EXAM:  Vitals with BMI 09/30/2020 09/21/2020 09/21/2020  Height 5\' 10"  - -  Weight 213 lbs - -  BMI 27.51 - -  Systolic 700 174 944  Diastolic 72 71 70  Pulse 68 72 76  Gen: Alert, well appearing.  Patient is oriented to person, place, time, and situation. AFFECT: pleasant, lucid thought and speech. CV: RRR, no m/r/g.   LUNGS: CTA bilat, nonlabored resps, good aeration in all lung fields. ABD: soft, NT/ND EXT: no clubbing or cyanosis.  no edema.    Pertinent labs/imaging Lab Results  Component Value Date   WBC 7.9 09/21/2020   HGB 11.5 (L) 09/21/2020   HCT 34.4 (L) 09/21/2020   MCV 83.7 09/21/2020   PLT 167 09/21/2020     Chemistry      Component Value Date/Time   NA 131 (L) 09/21/2020 0500   K 4.0 09/21/2020 0500   CL 100 09/21/2020 0500   CO2 23 09/21/2020 0500   BUN 15 09/21/2020 0500   CREATININE 1.12 09/21/2020 0500   CREATININE 1.16 02/10/2019 1415      Component Value Date/Time   CALCIUM 8.4 (L) 09/21/2020 0500   ALKPHOS 36 (L) 09/19/2020 0500   AST 23 09/19/2020 0500   ALT 19 09/19/2020 0500   BILITOT 0.8 09/19/2020 0500     Lab Results   Component Value Date   DDIMER 1.14 (H) 09/18/2020    ASSESSMENT/PLAN:  1) Acute unprovoked/idiopathic PE with pulm infarction. Improving on eliquis 5mg  bid and no s/s bleeding. Plan minimum of 6mo on eliquis.  Has eval coming up with hematology on 10/16/20. Checking cbc and bmet today to f/u newly started anticoagulation and AKI in hosp. BP well controlled.  FOLLOW UP:  keep f/u appt already scheduled with me for 12/05/20  Signed:  Crissie Sickles, MD           09/30/2020

## 2020-10-16 ENCOUNTER — Inpatient Hospital Stay: Payer: Medicare Other | Attending: Physician Assistant | Admitting: Physician Assistant

## 2020-10-16 ENCOUNTER — Inpatient Hospital Stay: Payer: Medicare Other

## 2020-10-16 ENCOUNTER — Encounter: Payer: Self-pay | Admitting: Physician Assistant

## 2020-10-16 ENCOUNTER — Other Ambulatory Visit: Payer: Self-pay

## 2020-10-16 VITALS — BP 121/81 | HR 69 | Temp 98.0°F | Resp 17 | Ht 70.0 in | Wt 207.6 lb

## 2020-10-16 DIAGNOSIS — R0602 Shortness of breath: Secondary | ICD-10-CM

## 2020-10-16 DIAGNOSIS — Z86711 Personal history of pulmonary embolism: Secondary | ICD-10-CM

## 2020-10-16 DIAGNOSIS — I2699 Other pulmonary embolism without acute cor pulmonale: Secondary | ICD-10-CM | POA: Insufficient documentation

## 2020-10-16 DIAGNOSIS — Z7901 Long term (current) use of anticoagulants: Secondary | ICD-10-CM | POA: Diagnosis not present

## 2020-10-16 LAB — CMP (CANCER CENTER ONLY)
ALT: 19 U/L (ref 0–44)
AST: 27 U/L (ref 15–41)
Albumin: 3.6 g/dL (ref 3.5–5.0)
Alkaline Phosphatase: 57 U/L (ref 38–126)
Anion gap: 9 (ref 5–15)
BUN: 19 mg/dL (ref 8–23)
CO2: 24 mmol/L (ref 22–32)
Calcium: 9.7 mg/dL (ref 8.9–10.3)
Chloride: 102 mmol/L (ref 98–111)
Creatinine: 1.07 mg/dL (ref 0.61–1.24)
GFR, Estimated: >60 mL/min (ref >60)
Glucose, Bld: 91 mg/dL (ref 70–99)
Potassium: 3.9 mmol/L (ref 3.5–5.1)
Sodium: 135 mmol/L (ref 135–145)
Total Bilirubin: 0.5 mg/dL (ref 0.3–1.2)
Total Protein: 7.9 g/dL (ref 6.5–8.1)

## 2020-10-16 LAB — CBC WITH DIFFERENTIAL (CANCER CENTER ONLY)
Abs Immature Granulocytes: 0.02 10*3/uL (ref 0.00–0.07)
Basophils Absolute: 0 10*3/uL (ref 0.0–0.1)
Basophils Relative: 1 %
Eosinophils Absolute: 0.7 10*3/uL — ABNORMAL HIGH (ref 0.0–0.5)
Eosinophils Relative: 12 %
HCT: 37 % — ABNORMAL LOW (ref 39.0–52.0)
Hemoglobin: 12.5 g/dL — ABNORMAL LOW (ref 13.0–17.0)
Immature Granulocytes: 0 %
Lymphocytes Relative: 23 %
Lymphs Abs: 1.4 10*3/uL (ref 0.7–4.0)
MCH: 27.3 pg (ref 26.0–34.0)
MCHC: 33.8 g/dL (ref 30.0–36.0)
MCV: 80.8 fL (ref 80.0–100.0)
Monocytes Absolute: 0.7 10*3/uL (ref 0.1–1.0)
Monocytes Relative: 11 %
Neutro Abs: 3.3 10*3/uL (ref 1.7–7.7)
Neutrophils Relative %: 53 %
Platelet Count: 252 10*3/uL (ref 150–400)
RBC: 4.58 MIL/uL (ref 4.22–5.81)
RDW: 15 % (ref 11.5–15.5)
WBC Count: 6.2 10*3/uL (ref 4.0–10.5)
nRBC: 0 % (ref 0.0–0.2)

## 2020-10-16 NOTE — Progress Notes (Signed)
[place] [phone]   Fax:(336) [place] NOTE  Patient Care Team: Tammi Sou, MD as PCP - General (Family Medicine) Juanita Craver, MD as Consulting Physician (Gastroenterology) Francee Gentile, [place] as Referring Physician (Chiropractic Medicine) Romero Belling, MD as Referring Physician (Dermatology) Bjorn Loser, MD as Consulting Physician (Urology)  Hematological/Oncological History 1) 09/18/2020-09/21/2020: Admitted for acute pulmonary emboli after presenting to the ED shortness of breath, sternal chest pain and upper abdominal pain. CTA chest confirmed multi segment pulmonary emboli in the right lower lobe with pulmonary infarct/ischemia. No evidence of heart strain. Patient was started on heparin and transitioned to Eliquis.   2) 10/16/2020: Establish care with Dede Query PA-C  CHIEF COMPLAINTS/PURPOSE OF CONSULTATION:  Acute pulmonary emboli   HISTORY OF PRESENTING ILLNESS:  Shawn [name] 75 y.o. male with medical history significant for history of thrombocytopenia, HTN, HLD, colon polyps, GERD, Chronic renal insufficiency and urinary retention. Patient is unaccompanied for this visit  On exam today, reports improving energy levels. He tries to stay active and is able to complete all his ADLs on his own. He reports appetite decrease with noted weight loss. He denies any nausea or vomiting. He reports that his bowel movements are more frequent and regular since hospital discharge. He denies any diarrhea or constipation. Patient denies easy bruising or signs of bleeding except for one episode of epistaxis while hospitalized. Patient reports improvement of shortness of breath. The sternal chest pain and upper abdominal pain has resolved. He denies any fevers, chills, night sweats, cough, skin changes or edema. He has no other complaints. Rest of the 10 point ROS is below.  MEDICAL HISTORY:  Past Medical History:  Diagnosis Date    Acute urinary retention 12/2018   ?BPH   Asymmetrical sensorineural hearing loss 2021   Likely from noise exposure.  [facility] [phone] ENT referral for this dx being relatively new.   Barrett's esophagus    Dr. Collene Mares   Cataract    Chronic renal insufficiency, stage 2 (mild) 2017   Stage II/III (GFR 60 ml/min)   GERD (gastroesophageal reflux disease)    History of adenomatous polyp of colon 09/2007   Polyps at 1st screening colonoscopy; 2014 no polyps.  Rpt 12/29/17: adenomatous polyp-->  Recall 5 yrs.   Hyperlipemia, mixed    Pt intol of welchol; fenofibrate helpful.  Fenofib inc to 145 and atorva 10mg  started 08/23/18-->great lipid panel 11/2018.   Hypertension    Pulmonary embolism ([place]) 09/2020   with pulm infarct.  Unprovoked->hosp ref to hem/onc   Subclinical hypothyroidism    per old PCP records.  TPO ab's elevated mildly 12/2017, with TSH 6.6 and normal T4 and T3==>developing Hashimoto's.    Thrombocytopenia ([place])    Hx of, mild/stable with monitoring per old PCP records    SURGICAL HISTORY: Past Surgical History:  Procedure Laterality Date   APPENDECTOMY     CATARACT EXTRACTION Bilateral 04/13/2018   05/14/2018   COLONOSCOPY  12/29/2017   polypectomy --path - adenoma,  Repeat in 5 years   COLONOSCOPY W/ POLYPECTOMY  2014   per Dr. Collene Mares, recall 02/2023   ESOPHAGOGASTRODUODENOSCOPY  2014; 2016; 11/16/16   2018--Barrett's esoph reconfirmed   TRANSTHORACIC ECHOCARDIOGRAM  09/19/2020   (in the setting of acute PE)->EF 70-75%, RV fxn mildly reduced, hyperdynamic LV fxn, normal PA pressure, no valve probs.   UPPER GI ENDOSCOPY  03/03/2019    SOCIAL HISTORY: Social History   Socioeconomic History   Marital  status: Married    Spouse name: Not on file   Number of children: Not on file   Years of education: Not on file   Highest education level: Not on file  Occupational History   Not on file  Tobacco Use   Smoking status: Never   Smokeless tobacco: Never   Vaping Use   Vaping Use: Never used  Substance and Sexual Activity   Alcohol use: No   Drug use: No   Sexual activity: Not on file  Other Topics Concern   Not on file  Social History Narrative   Married, grand-daughter lives with him.   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   Orig from [place] and [place].   No T/A/Ds.   Exercises 3 X/week--walking 2-3 miles.   Social Determinants of Health   Financial Resource Strain: Not on file  Food Insecurity: Not on file  Transportation Needs: Not on file  Physical Activity: Not on file  Stress: Not on file  Social Connections: Not on file  Intimate Partner Violence: Not on file    FAMILY HISTORY: Family History  Problem Relation Age of Onset   Hypertension Mother    Hyperlipidemia Mother    Arthritis Father    Diabetes Paternal Grandmother 39    ALLERGIES:  has No Known Allergies.  MEDICATIONS:  Current Outpatient Medications  Medication Sig Dispense Refill   apixaban (ELIQUIS) 5 MG TABS tablet 1 tab po bid 180 tablet 1   atorvastatin (LIPITOR) 10 MG tablet Take 1 tablet (10 mg total) by mouth daily. 90 tablet 3   fenofibrate (TRICOR) 145 MG tablet Take 1 tablet (145 mg total) by mouth daily. 90 tablet 3   fluticasone (FLONASE) 50 MCG/ACT nasal spray Place 1 spray into both nostrils daily.     fosinopril (MONOPRIL) 20 MG tablet Take 1 tablet (20 mg total) by mouth every morning. 90 tablet 3   hydrochlorothiazide (HYDRODIURIL) 25 MG tablet Take 0.5 tablets (12.5 mg total) by mouth daily. 45 tablet 3   omeprazole (PRILOSEC) 40 MG capsule Take 40 mg by mouth daily.     Polyethyl Glycol-Propyl Glycol (SYSTANE OP) Place 1 drop into both eyes as needed (dry eyes).     No current facility-administered medications for this visit.    REVIEW OF SYSTEMS:   Constitutional: ( - ) fevers, ( - )  chills , ( - ) night sweats Eyes: ( - ) blurriness of vision, ( - ) double vision, ( - ) watery eyes Ears,  nose, mouth, throat, and face: ( - ) mucositis, ( - ) sore throat Respiratory: ( - ) cough, ( - ) dyspnea, ( - ) wheezes Cardiovascular: ( - ) palpitation, ( - ) chest discomfort, ( - ) lower extremity swelling Gastrointestinal:  ( - ) nausea, ( - ) heartburn, ( - ) change in bowel habits Skin: ( - ) abnormal skin rashes Lymphatics: ( - ) new lymphadenopathy, ( - ) easy bruising Neurological: ( - ) numbness, ( - ) tingling, ( - ) new weaknesses Behavioral/Psych: ( - ) mood change, ( - ) new changes  All other systems were reviewed with the patient and are negative.  PHYSICAL EXAMINATION: ECOG PERFORMANCE STATUS: 1 - Symptomatic but completely ambulatory  Vitals:   10/16/20 1059  BP: 121/81  Pulse: 69  Resp: 17  Temp: 98 F (36.7 C)  SpO2: 95%   Filed Weights   10/16/20 1059  Weight: 207 lb 9.6  oz (94.2 kg)    GENERAL: well appearing male in NAD  SKIN: skin color, texture, turgor are normal, no rashes or significant lesions EYES: conjunctiva are pink and non-injected, sclera clear OROPHARYNX: no exudate, no erythema; lips, buccal mucosa, and tongue normal  NECK: supple, non-tender LYMPH:  no palpable lymphadenopathy in the cervical, axillary or supraclavicular lymph nodes.  LUNGS: clear to auscultation and percussion with normal breathing effort HEART: regular rate & rhythm and no murmurs and no lower extremity edema ABDOMEN: soft, non-tender, non-distended, normal bowel sounds Musculoskeletal: no cyanosis of digits and no clubbing  PSYCH: alert & oriented x 3, fluent speech NEURO: no focal motor/sensory deficits  LABORATORY DATA:  I have reviewed the data as listed CBC Latest Ref Rng & Units 09/30/2020 09/21/2020 09/20/2020  WBC 4.0 - 10.5 K/uL 7.6 7.9 8.6  Hemoglobin 13.0 - 17.0 g/dL 12.1(L) 11.5(L) 12.0(L)  Hematocrit 39.0 - 52.0 % 36.4(L) 34.4(L) 36.0(L)  Platelets 150.0 - 400.0 K/uL 370.0 167 148(L)    CMP Latest Ref Rng & Units 09/30/2020 09/21/2020 09/20/2020   Glucose 70 - 99 mg/dL 84 104(H) 107(H)  BUN 6 - 23 mg/dL 17 15 23   Creatinine 0.40 - 1.50 mg/dL 1.04 1.12 1.35(H)  Sodium 135 - 145 mEq/L 131(L) 131(L) 129(L)  Potassium 3.5 - 5.1 mEq/L 4.1 4.0 3.6  Chloride 96 - 112 mEq/L 96 100 97(L)  CO2 19 - 32 mEq/L 24 23 25   Calcium 8.4 - 10.5 mg/dL 9.0 8.4(L) 8.8(L)  Total Protein 6.5 - 8.1 g/dL - - -  Total Bilirubin 0.3 - 1.2 mg/dL - - -  Alkaline Phos 38 - 126 U/L - - -  AST 15 - 41 U/L - - -  ALT 0 - 44 U/L - - -    RADIOGRAPHIC STUDIES: I have personally reviewed the radiological images as listed and agreed with the findings in the report. DG Chest 2 View  Result Date: 09/18/2020 CLINICAL DATA:  Abdominal pain since last night. EXAM: CHEST - 2 VIEW COMPARISON:  September 18, 2020 FINDINGS: The cardiac silhouette is mildly enlarged.  Tortuosity of the aorta. Nodular opacity in the right lower lung field may represent mild peribronchial airspace consolidation or potentially a pulmonary nodule. Osseous structures are without acute abnormality. Soft tissues are grossly normal. IMPRESSION: Nodular opacity in the right lower lung field may represent mild peribronchial airspace consolidation or potentially a pulmonary nodule. Follow-up with nonemergent chest CT may be considered. Electronically Signed   By: Fidela Salisbury M.D.   On: 09/18/2020 17:04   CT Chest W Contrast  Result Date: 09/18/2020 CLINICAL DATA:  Abdominal pain and fever. EXAM: CT CHEST, ABDOMEN, AND PELVIS WITH CONTRAST TECHNIQUE: Multidetector CT imaging of the chest, abdomen and pelvis was performed following the standard protocol during bolus administration of intravenous contrast. CONTRAST:  164mL OMNIPAQUE IOHEXOL 300 MG/ML  SOLN COMPARISON:  None. FINDINGS: CT CHEST FINDINGS Cardiovascular: There is mild calcification of the aortic arch. The pulmonary arteries are markedly limited in evaluation secondary to suboptimal opacification with intravenous contrast. Intraluminal low  attenuation is noted involving posteromedial lower lobe branches of the right pulmonary artery (axial CT images 42 through 46, CT series 2). Normal heart size. No pericardial effusion. Mediastinum/Nodes: No enlarged mediastinal, hilar, or axillary lymph nodes. Thyroid gland, trachea, and esophagus demonstrate no significant findings. Lungs/Pleura: Mild-to-moderate severity atelectasis and/or infiltrate is seen within the posterior aspect of the right lower lobe. A very small right pleural effusion is seen. Subcentimeter, pleural based, partially  calcified nodules are seen within the anterior aspect of the right upper lobe. No pneumothorax is identified. Musculoskeletal: No chest wall mass or suspicious bone lesions identified. CT ABDOMEN PELVIS FINDINGS Hepatobiliary: There is diffuse fatty infiltration of the liver parenchyma. No focal liver abnormality is seen. No gallstones, gallbladder wall thickening, or biliary dilatation. Pancreas: Unremarkable. No pancreatic ductal dilatation or surrounding inflammatory changes. Spleen: Normal in size without focal abnormality. Adrenals/Urinary Tract: Adrenal glands are unremarkable. Kidneys are normal in size, without renal calculi or hydronephrosis. A 2.9 cm x 2.6 cm cyst is seen within the anterior aspect of the mid left kidney. Bladder is unremarkable. Stomach/Bowel: Stomach is within normal limits. The appendix is surgically absent. No evidence of bowel wall thickening, distention, or inflammatory changes. Vascular/Lymphatic: Aortic atherosclerosis. No enlarged abdominal or pelvic lymph nodes. Reproductive: The prostate gland is mildly enlarged. Other: A 3.3 cm x 3.2 cm fat containing right inguinal hernia is seen. No abdominopelvic ascites. Musculoskeletal: No acute or significant osseous findings. IMPRESSION: 1. Mild to moderate severity right lower lobe atelectasis and/or infiltrate. 2. Limited evaluation of the pulmonary arteries with intraluminal low attenuation  seen involving the lower lobe branches of the right pulmonary artery. A mild amount of pulmonary embolism cannot be excluded. Correlation with follow-up chest CTA versus nuclear medicine ventilation/perfusion scan is recommended. 3. Very small right pleural effusion. 4. Benign-appearing partially calcified pleural base nodules, as described above. 5. Simple left renal cyst. Electronically Signed   By: Virgina Norfolk M.D.   On: 09/18/2020 18:27   CT Angio Chest Pulmonary Embolism (PE) W or WO Contrast  Result Date: 09/19/2020 CLINICAL DATA:  Epigastric and right upper quadrant pain abnormal chest x-ray EXAM: CT ANGIOGRAPHY CHEST WITH CONTRAST TECHNIQUE: Multidetector CT imaging of the chest was performed using the standard protocol during bolus administration of intravenous contrast. Multiplanar CT image reconstructions and MIPs were obtained to evaluate the vascular anatomy. CONTRAST:  156mL OMNIPAQUE IOHEXOL 350 MG/ML SOLN COMPARISON:  Chest CT from yesterday FINDINGS: Cardiovascular: Multiple branching segmental size pulmonary artery filling defects in the right lower lobe. No right heart strain by RV to LV ratio. Normal heart size. No pericardial effusion. Mediastinum/Nodes: Negative for adenopathy or mass. Lungs/Pleura: Ground-glass opacity and streaky density with volume loss in the right lower lobe which is considered ischemic. There is a small right pleural effusion considered sympathetic. Mild left lower lobe atelectasis. No pulmonary edema. Upper Abdomen: Negative Musculoskeletal: Negative Review of the MIP images confirms the above findings. Critical Value/emergent results were called by telephone at the time of interpretation on 09/19/2020 at 8:05 am to provider Dr Erlinda Hong, who verbally acknowledged these results. IMPRESSION: Multi segment pulmonary emboli in the right lower lobe with pulmonary infarct/ischemia. Electronically Signed   By: Monte Fantasia M.D.   On: 09/19/2020 08:07   CT Abdomen Pelvis W  Contrast  Result Date: 09/18/2020 CLINICAL DATA:  Abdominal pain and fever. EXAM: CT CHEST, ABDOMEN, AND PELVIS WITH CONTRAST TECHNIQUE: Multidetector CT imaging of the chest, abdomen and pelvis was performed following the standard protocol during bolus administration of intravenous contrast. CONTRAST:  148mL OMNIPAQUE IOHEXOL 300 MG/ML  SOLN COMPARISON:  None. FINDINGS: CT CHEST FINDINGS Cardiovascular: There is mild calcification of the aortic arch. The pulmonary arteries are markedly limited in evaluation secondary to suboptimal opacification with intravenous contrast. Intraluminal low attenuation is noted involving posteromedial lower lobe branches of the right pulmonary artery (axial CT images 42 through 46, CT series 2). Normal heart size. No pericardial effusion. Mediastinum/Nodes:  No enlarged mediastinal, hilar, or axillary lymph nodes. Thyroid gland, trachea, and esophagus demonstrate no significant findings. Lungs/Pleura: Mild-to-moderate severity atelectasis and/or infiltrate is seen within the posterior aspect of the right lower lobe. A very small right pleural effusion is seen. Subcentimeter, pleural based, partially calcified nodules are seen within the anterior aspect of the right upper lobe. No pneumothorax is identified. Musculoskeletal: No chest wall mass or suspicious bone lesions identified. CT ABDOMEN PELVIS FINDINGS Hepatobiliary: There is diffuse fatty infiltration of the liver parenchyma. No focal liver abnormality is seen. No gallstones, gallbladder wall thickening, or biliary dilatation. Pancreas: Unremarkable. No pancreatic ductal dilatation or surrounding inflammatory changes. Spleen: Normal in size without focal abnormality. Adrenals/Urinary Tract: Adrenal glands are unremarkable. Kidneys are normal in size, without renal calculi or hydronephrosis. A 2.9 cm x 2.6 cm cyst is seen within the anterior aspect of the mid left kidney. Bladder is unremarkable. Stomach/Bowel: Stomach is within  normal limits. The appendix is surgically absent. No evidence of bowel wall thickening, distention, or inflammatory changes. Vascular/Lymphatic: Aortic atherosclerosis. No enlarged abdominal or pelvic lymph nodes. Reproductive: The prostate gland is mildly enlarged. Other: A 3.3 cm x 3.2 cm fat containing right inguinal hernia is seen. No abdominopelvic ascites. Musculoskeletal: No acute or significant osseous findings. IMPRESSION: 1. Mild to moderate severity right lower lobe atelectasis and/or infiltrate. 2. Limited evaluation of the pulmonary arteries with intraluminal low attenuation seen involving the lower lobe branches of the right pulmonary artery. A mild amount of pulmonary embolism cannot be excluded. Correlation with follow-up chest CTA versus nuclear medicine ventilation/perfusion scan is recommended. 3. Very small right pleural effusion. 4. Benign-appearing partially calcified pleural base nodules, as described above. 5. Simple left renal cyst. Electronically Signed   By: Virgina Norfolk M.D.   On: 09/18/2020 18:27   ECHOCARDIOGRAM COMPLETE  Result Date: 09/19/2020    ECHOCARDIOGRAM REPORT   Patient Name:   ORMOND LAZO Date of Exam: 09/19/2020 Medical Rec #:  [id number]     Height:       70.0 in Accession #:    [id number]    Weight:       220.4 lb Date of Birth:  06-09-1945     BSA:          2.175 m Patient Age:    22 years      BP:           142/86 mmHg Patient Gender: M             HR:           75 bpm. Exam Location:  [facility] Procedure: 2D Echo, Cardiac Doppler and Color Doppler Indications:    Pulmonary Embolus  History:        Patient has no prior history of Echocardiogram examinations.                 Signs/Symptoms:Shortness of Breath and Dyspnea; Risk                 Factors:Hypertension and Dyslipidemia.  Sonographer:    Dustin Flock Referring Phys: [phone] [place]  1. Left ventricular ejection fraction, by estimation, is 70 to 75%. The left ventricle has hyperdynamic  function. The left ventricle has no regional wall motion abnormalities. Left ventricular diastolic parameters were normal.  2. Right ventricular systolic function is mildly reduced. The right ventricular size is normal. There is normal pulmonary artery systolic pressure.  3. Trivial mitral valve regurgitation.  4. The aortic  valve is tricuspid. Aortic valve regurgitation is not visualized. Mild aortic valve sclerosis is present, with no evidence of aortic valve stenosis.  5. The inferior vena cava is normal in size with greater than 50% respiratory variability, suggesting right atrial pressure of 3 mmHg. FINDINGS  Left Ventricle: Left ventricular ejection fraction, by estimation, is 70 to 75%. The left ventricle has hyperdynamic function. The left ventricle has no regional wall motion abnormalities. The left ventricular internal cavity size was normal in size. There is no left ventricular hypertrophy. Left ventricular diastolic parameters were normal. Right Ventricle: The right ventricular size is normal. Right vetricular wall thickness was not assessed. Right ventricular systolic function is mildly reduced. There is normal pulmonary artery systolic pressure. The tricuspid regurgitant velocity is 2.20  m/s, and with an assumed right atrial pressure of 3 mmHg, the estimated right ventricular systolic pressure is 48.0 mmHg. Left Atrium: Left atrial size was normal in size. Right Atrium: Right atrial size was normal in size. Pericardium: There is no evidence of pericardial effusion. Mitral Valve: There is mild thickening of the mitral valve leaflet(s). Mild to moderate mitral annular calcification. Trivial mitral valve regurgitation. Tricuspid Valve: The tricuspid valve is normal in structure. Tricuspid valve regurgitation is trivial. Aortic Valve: The aortic valve is tricuspid. Aortic valve regurgitation is not visualized. Mild aortic valve sclerosis is present, with no evidence of aortic valve stenosis. Pulmonic  Valve: The pulmonic valve was normal in structure. Pulmonic valve regurgitation is not visualized. Aorta: The aortic root is normal in size and structure. Venous: The inferior vena cava is normal in size with greater than 50% respiratory variability, suggesting right atrial pressure of 3 mmHg. IAS/Shunts: No atrial level shunt detected by color flow Doppler.  LEFT VENTRICLE PLAX 2D LVIDd:         4.70 cm  Diastology LVIDs:         2.70 cm  LV e' medial:    7.40 cm/s LV PW:         1.20 cm  LV E/e' medial:  13.3 LV IVS:        1.10 cm  LV e' lateral:   9.36 cm/s LVOT diam:     2.40 cm  LV E/e' lateral: 10.5 LV SV:         126 LV SV Index:   58 LVOT Area:     4.52 cm  RIGHT VENTRICLE RV Basal diam:  3.40 cm RV S prime:     5.66 cm/s TAPSE (M-mode): 1.6 cm LEFT ATRIUM             Index       RIGHT ATRIUM           Index LA diam:        3.70 cm 1.70 cm/m  RA Area:     17.90 cm LA Vol (A2C):   34.2 ml 15.72 ml/m RA Volume:   48.60 ml  22.34 ml/m LA Vol (A4C):   33.2 ml 15.26 ml/m LA Biplane Vol: 35.6 ml 16.37 ml/m  AORTIC VALVE LVOT Vmax:   147.00 cm/s LVOT Vmean:  94.600 cm/s LVOT VTI:    0.279 m  AORTA Ao Root diam: 3.20 cm MITRAL VALVE                TRICUSPID VALVE MV Area (PHT): 3.60 cm     TR Peak grad:   19.4 mmHg MV Decel Time: 211 msec     TR Vmax:  220.00 cm/s MV E velocity: 98.50 cm/s MV A velocity: 107.00 cm/s  SHUNTS MV E/A ratio:  0.92         Systemic VTI:  0.28 m                             Systemic Diam: 2.40 cm Dorris Carnes MD Electronically signed by Dorris Carnes MD Signature Date/Time: 09/19/2020/10:16:46 AM    Final    VAS [place] LOWER EXTREMITY VENOUS (DVT)  Result Date: 09/19/2020  Lower Venous DVT Study Patient Name:  BEAUMONT AUSTAD  Date of Exam:   09/19/2020 Medical Rec #: [id number]      Accession #:    [id number] Date of Birth: Aug 31, 1945      Patient Gender: M Patient Age:   75Y Exam Location[facility]  [facility] Procedure:      VAS [place] LOWER EXTREMITY VENOUS (DVT) Referring Phys: [id number]  FANG XU --------------------------------------------------------------------------------  Indications: Pulmonary embolism, and SOB.  Comparison Study: No previous exams Performing Technologist: Jody Hill RVT, RDMS  Examination Guidelines: A complete evaluation includes B-mode imaging, spectral Doppler, color Doppler, and power Doppler as needed of all accessible portions of each vessel. Bilateral testing is considered an integral part of a complete examination. Limited examinations for reoccurring indications may be performed as noted. The reflux portion of the exam is performed with the patient in reverse Trendelenburg.  +---------+---------------+---------+-----------+----------+--------------+ RIGHT    CompressibilityPhasicitySpontaneityPropertiesThrombus Aging +---------+---------------+---------+-----------+----------+--------------+ CFV      Full           Yes      Yes                                 +---------+---------------+---------+-----------+----------+--------------+ SFJ      Full                                                        +---------+---------------+---------+-----------+----------+--------------+ FV Prox  Full           Yes      Yes                                 +---------+---------------+---------+-----------+----------+--------------+ FV Mid   Full           Yes      Yes                                 +---------+---------------+---------+-----------+----------+--------------+ FV DistalFull           Yes      Yes                                 +---------+---------------+---------+-----------+----------+--------------+ PFV      Full                                                        +---------+---------------+---------+-----------+----------+--------------+  POP      Full           Yes      Yes                                 +---------+---------------+---------+-----------+----------+--------------+ PTV      Full                                                         +---------+---------------+---------+-----------+----------+--------------+ PERO     Full                                                        +---------+---------------+---------+-----------+----------+--------------+   +---------+---------------+---------+-----------+----------+--------------+ LEFT     CompressibilityPhasicitySpontaneityPropertiesThrombus Aging +---------+---------------+---------+-----------+----------+--------------+ CFV      Full           Yes      Yes                                 +---------+---------------+---------+-----------+----------+--------------+ SFJ      Full                                                        +---------+---------------+---------+-----------+----------+--------------+ FV Prox  Full           Yes      Yes                                 +---------+---------------+---------+-----------+----------+--------------+ FV Mid   Full           Yes      Yes                                 +---------+---------------+---------+-----------+----------+--------------+ FV DistalFull           Yes      Yes                                 +---------+---------------+---------+-----------+----------+--------------+ PFV      Full                                                        +---------+---------------+---------+-----------+----------+--------------+ POP      Full           Yes      Yes                                 +---------+---------------+---------+-----------+----------+--------------+ PTV  Full                                                        +---------+---------------+---------+-----------+----------+--------------+ PERO     Full                                                        +---------+---------------+---------+-----------+----------+--------------+     Summary: BILATERAL: - No evidence of deep vein thrombosis seen in the  lower extremities, bilaterally. - No evidence of superficial venous thrombosis in the lower extremities, bilaterally. -No evidence of popliteal cyst, bilaterally.   *See table(s) above for measurements and observations. Electronically signed by Monica Martinez MD on 09/19/2020 at 7:11:32 PM.    Final     ASSESSMENT & PLAN Shawn [name] is a 75 y.o. male presenting to the clinic for evaluation for recently diagnosed acute pulmonary emboli in the right lower lobe. Patient reports compliance with Eliquis 5 mg twice daily with good tolerance. I reviewed potential underlying causes for VTEs including travel, infection, surgery, sedentary lifestyle and/or immobility or hormone replacement. There is no provoking factor that has been identified so recommendation is indefinite anticoagulation with Eliquis. I recommend labs today to check CBC, CMP and antiphospholipid antibody syndrome panel.   #Acute pulmonary emboli involving the right lower lobe: -Unprovoked so recommend indefinite anticoagulation -Currently on Eliqius 5 mg BID. Will consider maintenance dose at 2.5 mg BID after 6 months.  -Labs today to check CBC, CMP and antiphospholipid antibody syndrome panel.  -RTC in 3 months with labs.   Orders Placed This Encounter  Procedures   CBC with Differential ([place] Only)    Standing Status:   Future    Number of Occurrences:   1    Standing Expiration Date:   10/16/2021   CMP ([place] only)    Standing Status:   Future    Number of Occurrences:   1    Standing Expiration Date:   10/16/2021   Beta-2-glycoprotein i abs, IgG/M/A    Standing Status:   Future    Number of Occurrences:   1    Standing Expiration Date:   10/16/2021   Cardiolipin antibodies, IgG, IgM, IgA*    Standing Status:   Future    Number of Occurrences:   1    Standing Expiration Date:   10/16/2021   Lupus anticoagulant panel*    Standing Status:   Future    Number of Occurrences:   1    Standing Expiration Date:    10/16/2021    All questions were answered. The patient knows to call the clinic with any problems, questions or concerns.  I have spent a total of 60 minutes minutes of face-to-face and non-face-to-face time, preparing to see the patient, obtaining and/or reviewing separately obtained history, performing a medically appropriate examination, counseling and educating the patient, ordering tests, documenting clinical information in the electronic health record, and care coordination.   Dede Query, PA-C Department of Hematology/Oncology [place] at [facility] Phone: [phone]  Patient was seen with Dr. Lorenso Courier.   I have read the above note and personally examined the  patient. I agree with the assessment and plan as noted above.  Briefly Mrs. Taborda is a 75 year old male who presents for evaluation of an unprovoked pulmonary embolism.  Given the unprovoked nature of this VTE would recommend indefinite anticoagulation.  The anticoagulant he is currently on his Eliquis and we can continue this at 5 mg twice daily until he is completed 6 months of full dose treatment.  At that time we could consider transition to maintenance dose Eliquis 2.5 mg twice daily.  We will plan to see him back in 3 months time to see how he is doing on anticoagulation therapy.   Ledell Peoples, MD Department of Hematology/Oncology [place] at [facility] Phone: [phone] Pager: [phone] Email: [email or web address]

## 2020-10-17 LAB — CARDIOLIPIN ANTIBODIES, IGG, IGM, IGA
Anticardiolipin IgA: <9 APL U/mL (ref 0–11)
Anticardiolipin IgG: 15 GPL U/mL — ABNORMAL HIGH (ref 0–14)
Anticardiolipin IgM: 16 MPL U/mL — ABNORMAL HIGH (ref 0–12)

## 2020-10-17 LAB — DRVVT CONFIRM: dRVVT Confirm: 1.1 ratio (ref 0.8–1.2)

## 2020-10-17 LAB — LUPUS ANTICOAGULANT PANEL
DRVVT: 57.7 s — ABNORMAL HIGH (ref 0.0–47.0)
PTT Lupus Anticoagulant: 41.7 s (ref 0.0–51.9)

## 2020-10-17 LAB — DRVVT MIX: dRVVT Mix: 47.2 s — ABNORMAL HIGH (ref 0.0–40.4)

## 2020-10-18 ENCOUNTER — Telehealth: Payer: Self-pay | Admitting: Physician Assistant

## 2020-10-18 LAB — BETA-2-GLYCOPROTEIN I ABS, IGG/M/A
Beta-2 Glyco I IgG: <9 GPI IgG units (ref 0–20)
Beta-2-Glycoprotein I IgA: <9 GPI IgA units (ref 0–25)
Beta-2-Glycoprotein I IgM: <9 GPI IgM units (ref 0–32)

## 2020-10-18 NOTE — Telephone Encounter (Signed)
I called Mr. Shawn [name] to review lab results from 10/16/2020. I confirmed that renal and liver function was intact. In addition, there was no evidence of antiphospholipid antibody syndrome.  Recommend to continue on current anticoagulation and we will see patient back in clinic in 3 months.   Patient expressed understanding and satisfaction with the plan provided.

## 2020-12-05 ENCOUNTER — Other Ambulatory Visit: Payer: Self-pay

## 2020-12-05 ENCOUNTER — Encounter: Payer: Self-pay | Admitting: Family Medicine

## 2020-12-05 ENCOUNTER — Ambulatory Visit (INDEPENDENT_AMBULATORY_CARE_PROVIDER_SITE_OTHER): Payer: Medicare Other | Admitting: Family Medicine

## 2020-12-05 VITALS — BP 118/75 | HR 55 | Temp 97.6°F | Wt 195.6 lb

## 2020-12-05 DIAGNOSIS — I10 Essential (primary) hypertension: Secondary | ICD-10-CM

## 2020-12-05 DIAGNOSIS — Z125 Encounter for screening for malignant neoplasm of prostate: Secondary | ICD-10-CM | POA: Diagnosis not present

## 2020-12-05 DIAGNOSIS — E038 Other specified hypothyroidism: Secondary | ICD-10-CM

## 2020-12-05 DIAGNOSIS — E782 Mixed hyperlipidemia: Secondary | ICD-10-CM

## 2020-12-05 DIAGNOSIS — Z86711 Personal history of pulmonary embolism: Secondary | ICD-10-CM | POA: Diagnosis not present

## 2020-12-05 LAB — LIPID PANEL
Cholesterol: 128 mg/dL (ref 0–200)
HDL: 45.2 mg/dL (ref >39.00)
LDL Cholesterol: 70 mg/dL (ref 0–99)
NonHDL: 82.55
Total CHOL/HDL Ratio: 3
Triglycerides: 62 mg/dL (ref 0.0–149.0)
VLDL: 12.4 mg/dL (ref 0.0–40.0)

## 2020-12-05 LAB — T4, FREE: Free T4: 0.75 ng/dL (ref 0.60–1.60)

## 2020-12-05 LAB — TSH: TSH: 5.54 u[IU]/mL — ABNORMAL HIGH (ref 0.35–5.50)

## 2020-12-05 NOTE — Progress Notes (Signed)
OFFICE VISIT  12/05/2020  CC:  Chief Complaint  Patient presents with   Follow-up    6 month RCI   HPI:    Patient is a 75 y.o. Caucasian Shawn [name] who presents for f/u HTN, HLD, subclinical hypothyroidism, and hx of unprovoked PE. I last saw him 09/30/20 for hosp f/u-->PEs. A/P as of that visit: "1) Acute unprovoked/idiopathic PE with pulm infarction. Improving on eliquis '5mg'$  bid and no s/s bleeding. Plan minimum of 67moon eliquis.  Has eval coming up with hematology on 10/16/20. Checking cbc and bmet today to f/u newly started anticoagulation and AKI in hosp. BP well controlled."  INTERIM HX: Doing very well, no complaints.  He saw hem/onc 10/16/20 and anticoag indefinitely was recommended b/c thrombosis was unprovoked.  His hypercoag labs did not show any evidence of abnormality of his coag pathway. CBC and CMET normal at that time.  HTN: he takes 1/2 hctz 25 qd and fosinopril 20 qd.  HLD: he has done well with TLC and and has lost 30 lbs. Tolerating atorva 10 qd and fenofib 145 qd.  Subclin hypoth: last TSH 6 mo ago was 8.04, T4 and T3 normal.  ROS as above, plus--> no fevers, no CP, no SOB, no wheezing, no cough, no dizziness, no HAs, no rashes, no melena/hematochezia.  No polyuria or polydipsia.  No myalgias or arthralgias.  No focal weakness, paresthesias, or tremors.  No acute vision or hearing abnormalities.  No dysuria or unusual/new urinary urgency or frequency.  No recent changes in lower legs. No n/v/d or abd pain.  No palpitations.    Past Medical History:  Diagnosis Date   Acute urinary retention 12/2018   ?BPH   Asymmetrical sensorineural hearing loss 2021   Likely from noise exposure.  [facility] [phone] referral for this dx being relatively new.   Barrett's esophagus    Dr. MCollene Mares  Cataract    Chronic renal insufficiency, stage 2 (mild) 2017   Stage II/III (GFR 60 ml/min)   GERD (gastroesophageal reflux disease)    History of adenomatous polyp of  colon 09/2007   Polyps at 1st screening colonoscopy; 2014 no polyps.  Rpt 12/29/17: adenomatous polyp-->  Recall 5 yrs.   Hyperlipemia, mixed    Pt intol of welchol; fenofibrate helpful.  Fenofib inc to 145 and atorva '10mg'$  started 08/23/18-->great lipid panel 11/2018.   Hypertension    [place]   with pulm infarct.  Unprovoked->hosp ref to hem/onc   Subclinical hypothyroidism    per old PCP records.  TPO ab's elevated mildly 12/2017, with TSH 6.6 and normal T4 and T3==>developing Hashimoto's.    [place]    Hx of, mild/stable with monitoring per old PCP records    Past Surgical History:  Procedure Laterality Date   APPENDECTOMY     CATARACT EXTRACTION Bilateral 04/13/2018   05/14/2018   COLONOSCOPY  12/29/2017   polypectomy --path - adenoma,  Repeat in 5 years   COLONOSCOPY W/ POLYPECTOMY  2014   per Dr. MCollene Mares recall 02/2023   ESOPHAGOGASTRODUODENOSCOPY  2014; 2016; 11/16/16   2018--Barrett's esoph reconfirmed   TRANSTHORACIC ECHOCARDIOGRAM  09/19/2020   (in the setting of acute PE)->EF 70-75%, RV fxn mildly reduced, hyperdynamic LV fxn, normal PA pressure, no valve probs.   UPPER GI ENDOSCOPY  03/03/2019    Outpatient Medications Prior to Visit  Medication Sig Dispense Refill   apixaban (ELIQUIS) 5 MG TABS tablet 1 tab po bid 180 tablet 1  atorvastatin (LIPITOR) 10 MG tablet Take 1 tablet (10 mg total) by mouth daily. 90 tablet 3   fenofibrate (TRICOR) 145 MG tablet Take 1 tablet (145 mg total) by mouth daily. 90 tablet 3   fluticasone (FLONASE) 50 MCG/ACT nasal spray Place 1 spray into both nostrils daily.     fosinopril (MONOPRIL) 20 MG tablet Take 1 tablet (20 mg total) by mouth every morning. 90 tablet 3   hydrochlorothiazide (HYDRODIURIL) 25 MG tablet Take 0.5 tablets (12.5 mg total) by mouth daily. 45 tablet 3   omeprazole (PRILOSEC) 40 MG capsule Take 40 mg by mouth daily.     Polyethyl Glycol-Propyl Glycol (SYSTANE OP) Place 1 drop into  both eyes as needed (dry eyes).     No facility-administered medications prior to visit.    No Known Allergies  ROS As per HPI  PE: Vitals with BMI 12/05/2020 10/16/2020 09/30/2020  Height - '5\' 10"'$  '5\' 10"'$   Weight 195 lbs 10 oz 207 lbs 10 oz 213 lbs  BMI - [id number]  Systolic [id number]  Diastolic 75 81 72  Pulse 55 69 Shawn    Gen: Alert, well appearing.  Patient is oriented to person, place, time, and situation. AFFECT: pleasant, lucid thought and speech. CV: RRR, no m/r/g.   LUNGS: CTA bilat, nonlabored resps, good aeration in all lung fields. EXT: no clubbing or cyanosis.  no edema.    LABS:  Lab Results  Component Value Date   TSH 8.04 (H) 06/07/2020   Lab Results  Component Value Date   WBC 6.2 10/16/2020   HGB 12.5 (L) 10/16/2020   HCT 37.0 (L) 10/16/2020   MCV 80.8 10/16/2020   PLT 252 10/16/2020   Lab Results  Component Value Date   CREATININE 1.07 10/16/2020   BUN 19 10/16/2020   NA 135 10/16/2020   K 3.9 10/16/2020   CL 102 10/16/2020   CO2 24 10/16/2020   Lab Results  Component Value Date   ALT 19 10/16/2020   AST 27 10/16/2020   ALKPHOS 57 10/16/2020   BILITOT 0.5 10/16/2020   Lab Results  Component Value Date   CHOL 148 06/07/2020   Lab Results  Component Value Date   HDL 42.40 06/07/2020   Lab Results  Component Value Date   LDLCALC 85 06/07/2020   Lab Results  Component Value Date   TRIG 103.0 06/07/2020   Lab Results  Component Value Date   CHOLHDL 3 06/07/2020   Lab Results  Component Value Date   PSA 2.48 12/07/2019   PSA 1.84 08/19/2018   PSA 1.86 07/08/2017    IMPRESSION AND PLAN:  1) HTN: well controlled. Cont fosinopril 20 qd and 12.'5mg'$  hctz qd. Lytes cr consistently normal, most recently 1 mo ago.  2) Mixed HLD: tolerating statin and fenofibrate. FLP today.  Hepatic panel normal 1 mo ago. He expressed some interest on possibly trying to go off these meds in future, depending on lipid levels today.  3)  Subclin hypoth: thyroid panel today.  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 Hem/onc recommends eliquis '5mg'$  bid indefinitely. Discussed avoidance of any activities that would put him at risk for injury/bleeding. CBC normal 1 mo ago.  5) Preventative health care: Vaccines: He declines shingrix and Tdap.  Otherwise all vaccines UTD. Colon ca screening: needs repeat colonoscopy 2024. Prostate ca screening: he declines any further PSA screening today.  An After Visit Summary was printed and given to the patient.  FOLLOW UP:  Return in about 6 months (around 06/07/2021) for routine chronic illness f/u.  Signed:  Crissie Sickles, MD           12/05/2020

## 2020-12-06 LAB — T3: T3, Total: 94 ng/dL (ref 76–181)

## 2020-12-29 DIAGNOSIS — Z23 Encounter for immunization: Secondary | ICD-10-CM | POA: Diagnosis not present

## 2021-01-09 ENCOUNTER — Ambulatory Visit: Payer: Medicare Other

## 2021-01-09 ENCOUNTER — Ambulatory Visit (INDEPENDENT_AMBULATORY_CARE_PROVIDER_SITE_OTHER): Payer: Medicare Other | Admitting: *Deleted

## 2021-01-09 DIAGNOSIS — Z0000 Encounter for general adult medical examination without abnormal findings: Secondary | ICD-10-CM | POA: Diagnosis not present

## 2021-01-09 NOTE — Progress Notes (Signed)
Subjective:   Shawn [name] is a 75 y.o. male who presents for Medicare Annual/Subsequent preventive examine  I connected with  Shawn [name] on 01/09/21 by a telephone enabled telemedicine application and verified that I am speaking with the correct person using two identifiers.   I discussed the limitations of evaluation and management by telemedicine. The patient expressed understanding and agreed to proceed.    Review of Systems     Cardiac Risk Factors include: advanced age (>93men, >85 women);hypertension;male gender     Objective:    Today's Vitals   There is no height or weight on file to calculate BMI.  Advanced Directives 01/09/2021 10/16/2020 09/18/2020 12/07/2019 01/05/2019 11/11/2018 11/05/2017  Does Patient Have a Medical Advance Directive? Yes No;Yes Yes Yes Yes Yes Yes  Type of Paramedic of [place];Living will [place];Living will [place];Living will Living will [place];Living will Living will;Healthcare Power of [place];Living will  Does patient want to make changes to medical advance directive? - - No - Patient declined No - Patient declined - - -  Copy of [place] in Chart? Yes - validated most recent copy scanned in chart (See row information) No - copy requested No - copy requested - - - No - copy requested  Would patient like information on creating a medical advance directive? - No - Patient declined - - - - -    Current Medications (verified) Outpatient Encounter Medications as of 01/09/2021  Medication Sig   apixaban (ELIQUIS) 5 MG TABS tablet 1 tab po bid   atorvastatin (LIPITOR) 10 MG tablet Take 1 tablet (10 mg total) by mouth daily.   fenofibrate (TRICOR) 145 MG tablet Take 1 tablet (145 mg total) by mouth daily.   fluticasone (FLONASE) 50 MCG/ACT nasal spray Place 1 spray into both nostrils daily.   fosinopril (MONOPRIL)  20 MG tablet Take 1 tablet (20 mg total) by mouth every morning.   hydrochlorothiazide (HYDRODIURIL) 25 MG tablet Take 0.5 tablets (12.5 mg total) by mouth daily.   omeprazole (PRILOSEC) 40 MG capsule Take 40 mg by mouth daily.   Polyethyl Glycol-Propyl Glycol (SYSTANE OP) Place 1 drop into both eyes as needed (dry eyes).   No facility-administered encounter medications on file as of 01/09/2021.    Allergies (verified) Patient has no known allergies.   History: Past Medical History:  Diagnosis Date   Acute urinary retention 12/2018   ?BPH   Asymmetrical sensorineural hearing loss 2021   Likely from noise exposure.  [facility] [phone] ENT referral for this dx being relatively new.   Barrett's esophagus    Dr. Collene Mares   Cataract    Chronic renal insufficiency, stage 2 (mild) 2017   Stage II/III (GFR 60 ml/min)   GERD (gastroesophageal reflux disease)    History of adenomatous polyp of colon 09/2007   Polyps at 1st screening colonoscopy; 2014 no polyps.  Rpt 12/29/17: adenomatous polyp-->  Recall 5 yrs.   Hyperlipemia, mixed    Pt intol of welchol; fenofibrate helpful.  Fenofib inc to 145 and atorva 10mg  started 08/23/18-->great lipid panel 11/2018.   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  TPO ab's elevated mildly 12/2017, with TSH 6.6 and normal T4 and T3==>developing Hashimoto's.    Thrombocytopenia ([place])  Hx of, mild/stable with monitoring per old PCP records   Past Surgical History:  Procedure Laterality Date   APPENDECTOMY     CATARACT EXTRACTION Bilateral 04/13/2018   05/14/2018   COLONOSCOPY  12/29/2017   polypectomy --path - adenoma,  Repeat in 5 years   COLONOSCOPY W/ POLYPECTOMY  2014   per Dr. Collene Mares, recall 02/2023   ESOPHAGOGASTRODUODENOSCOPY  2014; 2016; 11/16/16   2018--Barrett's esoph reconfirmed   TRANSTHORACIC ECHOCARDIOGRAM  09/19/2020    (in the setting of acute PE)->EF 70-75%, RV fxn mildly reduced, hyperdynamic LV fxn, normal PA pressure, no valve probs.   UPPER GI ENDOSCOPY  03/03/2019   Family History  Problem Relation Age of Onset   Hypertension Mother    Hyperlipidemia Mother    Arthritis Father    Diabetes Paternal Grandmother 46   Social History   Socioeconomic History   Marital status: Married    Spouse name: Not on file   Number of children: Not on file   Years of education: Not on file   Highest education level: Not on file  Occupational History   Not on file  Tobacco Use   Smoking status: Never   Smokeless tobacco: Never  Vaping Use   Vaping Use: Never used  Substance and Sexual Activity   Alcohol use: No   Drug use: No   Sexual activity: Not on file  Other Topics Concern   Not on file  Social History Narrative   Married, grand-daughter lives with him.   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   Orig from [place] and [place].   No T/A/Ds.   Exercises 3 X/week--walking 2-3 miles.   Social Determinants of Health   Financial Resource Strain: Low Risk    Difficulty of Paying Living Expenses: Not hard at all  Food Insecurity: No Food Insecurity   Worried About Charity fundraiser in the Last Year: Never true   [place] in the Last Year: Never true  Transportation Needs: No Transportation Needs   Lack of Transportation (Medical): No   Lack of Transportation (Non-Medical): No  Physical Activity: Unknown   Days of Exercise per Week: Not on file   Minutes of Exercise per Session: 60 min  Stress: No Stress Concern Present   Feeling of Stress : Not at all  Social Connections: Socially Integrated   Frequency of Communication with Friends and Family: More than three times a week   Frequency of Social Gatherings with Friends and Family: More than three times a week   Attends Religious Services: More than 4 times per year   Active Member of [employer] or  Organizations: Yes   Attends Music therapist: More than 4 times per year   Marital Status: Married    Tobacco Counseling Counseling given: Not Answered   Clinical Intake:  Pre-visit preparation completed: Yes        Nutritional Risks: None Diabetes: No  How often do you need to have someone help you when you read instructions, pamphlets, or other written materials from your doctor or pharmacy?: 1 - Never  Diabetic?no  Interpreter Needed?: No  Information entered by :: Leroy Kennedy LPN   Activities of Daily Living In your present state of health, do you have any difficulty performing the following activities: 01/09/2021 09/18/2020  Hearing? N N  Vision? N N  Difficulty concentrating or making decisions? N N  Walking or climbing stairs? N N  Dressing or bathing?  N N  Doing errands, shopping? N N  Preparing Food and eating ? N -  Using the Toilet? N -  In the past six months, have you accidently leaked urine? N -  Do you have problems with loss of bowel control? N -  Managing your Medications? N -  Managing your Finances? N -  Housekeeping or managing your Housekeeping? N -  Some recent data might be hidden    Patient Care Team: Tammi Sou, MD as PCP - General (Family Medicine) Juanita Craver, MD as Consulting Physician (Gastroenterology) Francee Gentile, [place] as Referring Physician (Chiropractic Medicine) Romero Belling, MD as Referring Physician (Dermatology) Bjorn Loser, MD as Consulting Physician (Urology)  Indicate any recent Medical Services you may have received from other than [facility] providers in the past year (date may be approximate).     Assessment:   This is a routine wellness examination for [facility].  Hearing/Vision screen Hearing Screening - Comments:: No  trouble hearing Vision Screening - Comments:: [facility] Dr. Clydene Laming Up to date  Dietary issues and exercise activities discussed: Current Exercise Habits: Home  exercise routine, Type of exercise: Other - see comments (bike ride 4 miles a day/  lots of yard work), Time (Minutes): 40, Frequency (Times/Week): 4, Weekly Exercise (Minutes/Week): 160, Intensity: Moderate   Goals Addressed             This Visit's Progress    Patient Stated   On track    Maintain current health by staying active.      Patient Stated       Maintain current lifestyle        Depression Screen PHQ 2/9 Scores 01/09/2021 09/30/2020 12/07/2019 11/11/2018 08/16/2018 11/05/2017 07/08/2017  PHQ - 2 Score 0 0 0 0 0 0 0    Fall Risk Fall Risk  09/30/2020 12/07/2019 11/11/2018 08/16/2018 11/05/2017  Falls in the past year? 0 0 0 0 Yes  Comment - - - - falling off bike, torn tendons/ligaments  Number falls in past yr: 0 0 0 - 1  Injury with Fall? 0 0 0 0 Yes  Comment - - - - -  Follow up Falls evaluation completed Falls evaluation completed Falls prevention discussed Falls evaluation completed Falls prevention discussed    FALL RISK PREVENTION PERTAINING TO THE HOME:  Any stairs in or around the home? No  If so, are there any without handrails? No  Home free of loose throw rugs in walkways, pet beds, electrical cords, etc? Yes  Adequate lighting in your home to reduce risk of falls? Yes   ASSISTIVE DEVICES UTILIZED TO PREVENT FALLS:  Life alert? No  Use of a cane, walker or w/c? Yes  Grab bars in the bathroom? No  Shower chair or bench in shower? Yes  Elevated toilet seat or a handicapped toilet? No   TIMED UP AND GO:  Was the test performed? No .    Cognitive Function:  Normal cognitive status assessed by direct observation by this Nurse Health Advisor. No abnormalities found.   MMSE - Mini Mental State Exam 11/11/2018  Orientation to time 5  Orientation to Place 5  Registration 3  Attention/ Calculation 5  Recall 3  Language- name 2 objects 2  Language- repeat 1  Language- follow 3 step command 3  Language- read & follow direction 1  Write a sentence 1   Copy design 1  Total score 30        Immunizations Immunization History  Administered Date(s) Administered   Fluad Quad(high Dose 65+) 01/05/2021   Influenza, Quadrivalent, Recombinant, Inj, Pf 01/06/2018, 01/05/2019   Influenza-Unspecified 12/05/2014, 01/12/2016, 12/14/2016, 01/05/2019, 02/12/2020   PFIZER(Purple Top)SARS-COV-2 Vaccination 05/21/2019, 06/15/2019   Pneumococcal Conjugate-13 10/26/2014   Pneumococcal Polysaccharide-23 08/31/2012   Tdap 05/17/2006    TDAP status: Due, Education has been provided regarding the importance of this vaccine. Advised may receive this vaccine at local pharmacy or Health Dept. Aware to provide a copy of the vaccination record if obtained from local pharmacy or Health Dept. Verbalized acceptance and understanding.  Flu Vaccine status: Declined, Education has been provided regarding the importance of this vaccine but patient still declined. Advised may receive this vaccine at local pharmacy or Health Dept. Aware to provide a copy of the vaccination record if obtained from local pharmacy or Health Dept. Verbalized acceptance and understanding.  Pneumococcal vaccine status: Up to date  Covid-19 vaccine status: Declined, Education has been provided regarding the importance of this vaccine but patient still declined. Advised may receive this vaccine at local pharmacy or Health Dept.or vaccine clinic. Aware to provide a copy of the vaccination record if obtained from local pharmacy or Health Dept. Verbalized acceptance and understanding.  Qualifies for Shingles Vaccine? Yes   Zostavax completed No   Shingrix Completed?: No.    Education has been provided regarding the importance of this vaccine. Patient has been advised to call insurance company to determine out of pocket expense if they have not yet received this vaccine. Advised may also receive vaccine at local pharmacy or Health Dept. Verbalized acceptance and understanding.  Screening  Tests Health Maintenance  Topic Date Due   COVID-19 Vaccine (3 - Pfizer risk series) 01/25/2021 (Originally 07/13/2019)   Zoster Vaccines- Shingrix (1 of 2) 04/10/2021 (Originally 05/06/1964)   TETANUS/TDAP  06/07/2021 (Originally 05/17/2016)   COLONOSCOPY (Pts 45-52yrs Insurance coverage will need to be confirmed)  12/30/2022   INFLUENZA VACCINE  Completed   HPV VACCINES  Aged Out   Hepatitis C Screening  Discontinued    Health Maintenance  There are no preventive care reminders to display for this patient.   Colorectal cancer screening: Type of screening: Colonoscopy. Completed  . Repeat every 5 years  Lung Cancer Screening: (Low Dose CT Chest recommended if Age 45-80 years, 30 pack-year currently smoking OR have quit w/in 15years.) does not qualify.   Lung Cancer Screening Referral:   Additional Screening:  Hepatitis C Screening: does qualify  Vision Screening: Recommended annual ophthalmology exams for early detection of glaucoma and other disorders of the eye. Is the patient up to date with their annual eye exam?  Yes  Who is the provider or what is the name of the office in which the patient attends annual eye exams? Dr. Clydene Laming If pt is not established with a provider, would they like to be referred to a provider to establish care? No .   Dental Screening: Recommended annual dental exams for proper oral hygiene  Community Resource Referral / Chronic Care Management: CRR required this visit?  No   CCM required this visit?  No      Plan:     I have personally reviewed and noted the following in the patient's chart:   Medical and social history Use of alcohol, tobacco or illicit drugs  Current medications and supplements including opioid prescriptions. Patient is not currently taking opioid prescriptions. Functional ability and status Nutritional status Physical activity Advanced directives List of other physicians Hospitalizations, surgeries, and ER visits  in  previous 12 months Vitals Screenings to include cognitive, depression, and falls Referrals and appointments  In addition, I have reviewed and discussed with patient certain preventive protocols, quality metrics, and best practice recommendations. A written personalized care plan for preventive services as well as general preventive health recommendations were provided to patient.     Leroy Kennedy, LPN   [id number]   Nurse Notes:

## 2021-01-09 NOTE — Patient Instructions (Signed)
Shawn [name] , Thank you for taking time to come for your Medicare Wellness Visit. I appreciate your ongoing commitment to your health goals. Please review the following plan we discussed and let me know if I can assist you in the future.   Screening recommendations/referrals: Colonoscopy: up to date Recommended yearly ophthalmology/optometry visit for glaucoma screening and checkup Recommended yearly dental visit for hygiene and checkup  Vaccinations: Influenza vaccine: Education provided Pneumococcal vaccine: up to date Tdap vaccine: Education provided Shingles vaccine: Education provided    Advanced directives: yes   Conditions/risks identified:   Next appointment: 06-06-2021 9:30 Dr. Anitra Lauth  Preventive Care 52 Years and Older, Male Preventive care refers to lifestyle choices and visits with your health care provider that can promote health and wellness. What does preventive care include? A yearly physical exam. This is also called an annual well check. Dental exams once or twice a year. Routine eye exams. Ask your health care provider how often you should have your eyes checked. Personal lifestyle choices, including: Daily care of your teeth and gums. Regular physical activity. Eating a healthy diet. Avoiding tobacco and drug use. Limiting alcohol use. Practicing safe sex. Taking low doses of aspirin every day. Taking vitamin and mineral supplements as recommended by your health care provider. What happens during an annual well check? The services and screenings done by your health care provider during your annual well check will depend on your age, overall health, lifestyle risk factors, and family history of disease. Counseling  Your health care provider may ask you questions about your: Alcohol use. Tobacco use. Drug use. Emotional well-being. Home and relationship well-being. Sexual activity. Eating habits. History of falls. Memory and ability to understand  (cognition). Work and work Statistician. Screening  You may have the following tests or measurements: Height, weight, and BMI. Blood pressure. Lipid and cholesterol levels. These may be checked every 5 years, or more frequently if you are over 74 years old. Skin check. Lung cancer screening. You may have this screening every year starting at age 80 if you have a 30-pack-year history of smoking and currently smoke or have quit within the past 15 years. Fecal occult blood test (FOBT) of the stool. You may have this test every year starting at age 93. Flexible sigmoidoscopy or colonoscopy. You may have a sigmoidoscopy every 5 years or a colonoscopy every 10 years starting at age 19. Prostate cancer screening. Recommendations will vary depending on your family history and other risks. Hepatitis C blood test. Hepatitis B blood test. Sexually transmitted disease (STD) testing. Diabetes screening. This is done by checking your blood sugar (glucose) after you have not eaten for a while (fasting). You may have this done every 1-3 years. Abdominal aortic aneurysm (AAA) screening. You may need this if you are a current or former smoker. Osteoporosis. You may be screened starting at age 75 if you are at high risk. Talk with your health care provider about your test results, treatment options, and if necessary, the need for more tests. Vaccines  Your health care provider may recommend certain vaccines, such as: Influenza vaccine. This is recommended every year. Tetanus, diphtheria, and acellular pertussis (Tdap, Td) vaccine. You may need a Td booster every 10 years. Zoster vaccine. You may need this after age 51. Pneumococcal 13-valent conjugate (PCV13) vaccine. One dose is recommended after age 3. Pneumococcal polysaccharide (PPSV23) vaccine. One dose is recommended after age 79. Talk to your health care provider about which screenings and vaccines you need  and how often you need them. This  information is not intended to replace advice given to you by your health care provider. Make sure you discuss any questions you have with your health care provider. Document Released: 04/26/2015 Document Revised: 12/18/2015 Document Reviewed: 01/29/2015 Elsevier Interactive Patient Education  2017 [place] Prevention in the Home Falls can cause injuries. They can happen to people of all ages. There are many things you can do to make your home safe and to help prevent falls. What can I do on the outside of my home? Regularly fix the edges of walkways and driveways and fix any cracks. Remove anything that might make you trip as you walk through a door, such as a raised step or threshold. Trim any bushes or trees on the path to your home. Use bright outdoor lighting. Clear any walking paths of anything that might make someone trip, such as rocks or tools. Regularly check to see if handrails are loose or broken. Make sure that both sides of any steps have handrails. Any raised decks and porches should have guardrails on the edges. Have any leaves, snow, or ice cleared regularly. Use sand or salt on walking paths during winter. Clean up any spills in your garage right away. This includes oil or grease spills. What can I do in the bathroom? Use night lights. Install grab bars by the toilet and in the tub and shower. Do not use towel bars as grab bars. Use non-skid mats or decals in the tub or shower. If you need to sit down in the shower, use a plastic, non-slip stool. Keep the floor dry. Clean up any water that spills on the floor as soon as it happens. Remove soap buildup in the tub or shower regularly. Attach bath mats securely with double-sided non-slip rug tape. Do not have throw rugs and other things on the floor that can make you trip. What can I do in the bedroom? Use night lights. Make sure that you have a light by your bed that is easy to reach. Do not use any sheets or  blankets that are too big for your bed. They should not hang down onto the floor. Have a firm chair that has side arms. You can use this for support while you get dressed. Do not have throw rugs and other things on the floor that can make you trip. What can I do in the kitchen? Clean up any spills right away. Avoid walking on wet floors. Keep items that you use a lot in easy-to-reach places. If you need to reach something above you, use a strong step stool that has a grab bar. Keep electrical cords out of the way. Do not use floor polish or wax that makes floors slippery. If you must use wax, use non-skid floor wax. Do not have throw rugs and other things on the floor that can make you trip. What can I do with my stairs? Do not leave any items on the stairs. Make sure that there are handrails on both sides of the stairs and use them. Fix handrails that are broken or loose. Make sure that handrails are as long as the stairways. Check any carpeting to make sure that it is firmly attached to the stairs. Fix any carpet that is loose or worn. Avoid having throw rugs at the top or bottom of the stairs. If you do have throw rugs, attach them to the floor with carpet tape. Make sure that you  have a light switch at the top of the stairs and the bottom of the stairs. If you do not have them, ask someone to add them for you. What else can I do to help prevent falls? Wear shoes that: Do not have high heels. Have rubber bottoms. Are comfortable and fit you well. Are closed at the toe. Do not wear sandals. If you use a stepladder: Make sure that it is fully opened. Do not climb a closed stepladder. Make sure that both sides of the stepladder are locked into place. Ask someone to hold it for you, if possible. Clearly mark and make sure that you can see: Any grab bars or handrails. First and last steps. Where the edge of each step is. Use tools that help you move around (mobility aids) if they are  needed. These include: Canes. Walkers. Scooters. Crutches. Turn on the lights when you go into a dark area. Replace any light bulbs as soon as they burn out. Set up your furniture so you have a clear path. Avoid moving your furniture around. If any of your floors are uneven, fix them. If there are any pets around you, be aware of where they are. Review your medicines with your doctor. Some medicines can make you feel dizzy. This can increase your chance of falling. Ask your doctor what other things that you can do to help prevent falls. This information is not intended to replace advice given to you by your health care provider. Make sure you discuss any questions you have with your health care provider. Document Released: 01/24/2009 Document Revised: 09/05/2015 Document Reviewed: 05/04/2014 Elsevier Interactive Patient Education  2017 [employer].

## 2021-01-14 ENCOUNTER — Other Ambulatory Visit: Payer: Self-pay | Admitting: Physician Assistant

## 2021-01-14 DIAGNOSIS — Z86711 Personal history of pulmonary embolism: Secondary | ICD-10-CM

## 2021-01-15 ENCOUNTER — Inpatient Hospital Stay: Payer: Medicare Other

## 2021-01-15 ENCOUNTER — Inpatient Hospital Stay: Payer: Medicare Other | Attending: Physician Assistant | Admitting: Physician Assistant

## 2021-01-15 ENCOUNTER — Other Ambulatory Visit: Payer: Self-pay

## 2021-01-15 VITALS — BP 129/76 | HR 58 | Temp 98.5°F | Resp 18 | Wt 196.0 lb

## 2021-01-15 DIAGNOSIS — Z9049 Acquired absence of other specified parts of digestive tract: Secondary | ICD-10-CM | POA: Diagnosis not present

## 2021-01-15 DIAGNOSIS — Z8719 Personal history of other diseases of the digestive system: Secondary | ICD-10-CM | POA: Insufficient documentation

## 2021-01-15 DIAGNOSIS — Z7901 Long term (current) use of anticoagulants: Secondary | ICD-10-CM | POA: Diagnosis not present

## 2021-01-15 DIAGNOSIS — Z833 Family history of diabetes mellitus: Secondary | ICD-10-CM | POA: Insufficient documentation

## 2021-01-15 DIAGNOSIS — Z79899 Other long term (current) drug therapy: Secondary | ICD-10-CM | POA: Diagnosis not present

## 2021-01-15 DIAGNOSIS — Z86010 Personal history of colonic polyps: Secondary | ICD-10-CM | POA: Diagnosis not present

## 2021-01-15 DIAGNOSIS — R072 Precordial pain: Secondary | ICD-10-CM | POA: Insufficient documentation

## 2021-01-15 DIAGNOSIS — Z86711 Personal history of pulmonary embolism: Secondary | ICD-10-CM | POA: Insufficient documentation

## 2021-01-15 DIAGNOSIS — I2699 Other pulmonary embolism without acute cor pulmonale: Secondary | ICD-10-CM | POA: Diagnosis not present

## 2021-01-15 DIAGNOSIS — R768 Other specified abnormal immunological findings in serum: Secondary | ICD-10-CM | POA: Diagnosis not present

## 2021-01-15 DIAGNOSIS — Z8349 Family history of other endocrine, nutritional and metabolic diseases: Secondary | ICD-10-CM | POA: Diagnosis not present

## 2021-01-15 DIAGNOSIS — R1010 Upper abdominal pain, unspecified: Secondary | ICD-10-CM | POA: Diagnosis not present

## 2021-01-15 DIAGNOSIS — Z8249 Family history of ischemic heart disease and other diseases of the circulatory system: Secondary | ICD-10-CM | POA: Diagnosis not present

## 2021-01-15 DIAGNOSIS — Z8261 Family history of arthritis: Secondary | ICD-10-CM | POA: Insufficient documentation

## 2021-01-15 LAB — CBC WITH DIFFERENTIAL (CANCER CENTER ONLY)
Abs Immature Granulocytes: 0.01 10*3/uL (ref 0.00–0.07)
Basophils Absolute: 0 10*3/uL (ref 0.0–0.1)
Basophils Relative: 1 %
Eosinophils Absolute: 0.1 10*3/uL (ref 0.0–0.5)
Eosinophils Relative: 2 %
HCT: 38.8 % — ABNORMAL LOW (ref 39.0–52.0)
Hemoglobin: 13.3 g/dL (ref 13.0–17.0)
Immature Granulocytes: 0 %
Lymphocytes Relative: 35 %
Lymphs Abs: 1.8 10*3/uL (ref 0.7–4.0)
MCH: 27.2 pg (ref 26.0–34.0)
MCHC: 34.3 g/dL (ref 30.0–36.0)
MCV: 79.3 fL — ABNORMAL LOW (ref 80.0–100.0)
Monocytes Absolute: 0.6 10*3/uL (ref 0.1–1.0)
Monocytes Relative: 11 %
Neutro Abs: 2.7 10*3/uL (ref 1.7–7.7)
Neutrophils Relative %: 51 %
Platelet Count: 197 10*3/uL (ref 150–400)
RBC: 4.89 MIL/uL (ref 4.22–5.81)
RDW: 17.1 % — ABNORMAL HIGH (ref 11.5–15.5)
WBC Count: 5.2 10*3/uL (ref 4.0–10.5)
nRBC: 0 % (ref 0.0–0.2)

## 2021-01-15 LAB — CMP (CANCER CENTER ONLY)
ALT: 22 U/L (ref 0–44)
AST: 35 U/L (ref 15–41)
Albumin: 4.1 g/dL (ref 3.5–5.0)
Alkaline Phosphatase: 56 U/L (ref 38–126)
Anion gap: 9 (ref 5–15)
BUN: 20 mg/dL (ref 8–23)
CO2: 24 mmol/L (ref 22–32)
Calcium: 9.7 mg/dL (ref 8.9–10.3)
Chloride: 105 mmol/L (ref 98–111)
Creatinine: 1.15 mg/dL (ref 0.61–1.24)
GFR, Estimated: >60 mL/min (ref >60)
Glucose, Bld: 74 mg/dL (ref 70–99)
Potassium: 4.2 mmol/L (ref 3.5–5.1)
Sodium: 138 mmol/L (ref 135–145)
Total Bilirubin: 0.5 mg/dL (ref 0.3–1.2)
Total Protein: 7.4 g/dL (ref 6.5–8.1)

## 2021-01-15 NOTE — Progress Notes (Signed)
[place] [phone]   Fax:([phone]  PROGRESS NOTE  Patient Care Team: Tammi Sou, MD as PCP - General (Family Medicine) Juanita Craver, MD as Consulting Physician (Gastroenterology) Francee Gentile, [place] as Referring Physician (Chiropractic Medicine) Romero Belling, MD as Referring Physician (Dermatology) Bjorn Loser, MD as Consulting Physician (Urology)  Hematological/Oncological History 1) 09/18/2020-09/21/2020: Admitted for acute pulmonary emboli after presenting to the ED shortness of breath, sternal chest pain and upper abdominal pain. CTA chest confirmed multi segment pulmonary emboli in the right lower lobe with pulmonary infarct/ischemia. No evidence of heart strain. Patient was started on heparin and transitioned to Eliquis.   2) 10/16/2020: Establish care with Dede Query PA-C. Recommended indefinite anticoagulation due to unprovoked PE.   CHIEF COMPLAINTS/PURPOSE OF CONSULTATION:  Acute pulmonary emboli   HISTORY OF PRESENTING ILLNESS:  Shawn [name] 75 y.o. male returns for follow-up for history of unprovoked pulmonary emboli currently on indefinite anticoagulation with Eliquis.  She is unaccompanied for this visit.  On exam today, Shawn [name] reports he continues to have stable energy levels.  He is trying to stay active and has lost 30 pounds since June, all of which was intentional.  He eats a balanced diet.  He denies any nausea, vomiting or abdominal pain.  He has regular bowel movements without any diarrhea or constipation.  Patient denies easy bruising or signs of active bleeding.  He notes that if he has a laceration or scratch, it takes longer to heal.  Additionally, he has noticed intermittent episodes of pruritus on his hands and forearms since starting Eliquis.  He denies any rash or other hypersensitivity reaction.  He denies any fevers, chills, shortness of breath, chest pain or cough.He has no other complaints. Rest of the 10  point ROS is below.  MEDICAL HISTORY:  Past Medical History:  Diagnosis Date   Acute urinary retention 12/2018   ?BPH   Asymmetrical sensorineural hearing loss 2021   Likely from noise exposure.  [facility] [phone] ENT referral for this dx being relatively new.   Barrett's esophagus    Dr. Collene Mares   Cataract    Chronic renal insufficiency, stage 2 (mild) 2017   Stage II/III (GFR 60 ml/min)   GERD (gastroesophageal reflux disease)    History of adenomatous polyp of colon 09/2007   Polyps at 1st screening colonoscopy; 2014 no polyps.  Rpt 12/29/17: adenomatous polyp-->  Recall 5 yrs.   Hyperlipemia, mixed    Pt intol of welchol; fenofibrate helpful.  Fenofib inc to 145 and atorva 10mg  started 08/23/18-->great lipid panel 11/2018.   Hypertension    Pulmonary embolism ([place]) 09/2020   with pulm infarct.  Unprovoked->hosp ref to hem/onc   Subclinical hypothyroidism    per old PCP records.  TPO ab's elevated mildly 12/2017, with TSH 6.6 and normal T4 and T3==>developing Hashimoto's.    Thrombocytopenia ([place])    Hx of, mild/stable with monitoring per old PCP records    SURGICAL HISTORY: Past Surgical History:  Procedure Laterality Date   APPENDECTOMY     CATARACT EXTRACTION Bilateral 04/13/2018   05/14/2018   COLONOSCOPY  12/29/2017   polypectomy --path - adenoma,  Repeat in 5 years   COLONOSCOPY W/ POLYPECTOMY  2014   per Dr. Collene Mares, recall 02/2023   ESOPHAGOGASTRODUODENOSCOPY  2014; 2016; 11/16/16   2018--Barrett's esoph reconfirmed   TRANSTHORACIC ECHOCARDIOGRAM  09/19/2020   (in the setting of acute PE)->EF 70-75%, RV fxn mildly reduced, hyperdynamic LV fxn, normal PA pressure,  no valve probs.   UPPER GI ENDOSCOPY  03/03/2019    SOCIAL HISTORY: Social History   Socioeconomic History   Marital status: Married    Spouse name: Not on file   Number of children: Not on file   Years of education: Not on file   Highest education level: Not on file  Occupational History    Not on file  Tobacco Use   Smoking status: Never   Smokeless tobacco: Never  Vaping Use   Vaping Use: Never used  Substance and Sexual Activity   Alcohol use: No   Drug use: No   Sexual activity: Not on file  Other Topics Concern   Not on file  Social History Narrative   Married, grand-daughter lives with him.   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   Orig from [place] and [place].   No T/A/Ds.   Exercises 3 X/week--walking 2-3 miles.   Social Determinants of Health   Financial Resource Strain: Low Risk    Difficulty of Paying Living Expenses: Not hard at all  Food Insecurity: No Food Insecurity   Worried About Charity fundraiser in the Last Year: Never true   [place] in the Last Year: Never true  Transportation Needs: No Transportation Needs   Lack of Transportation (Medical): No   Lack of Transportation (Non-Medical): No  Physical Activity: Unknown   Days of Exercise per Week: Not on file   Minutes of Exercise per Session: 60 min  Stress: No Stress Concern Present   Feeling of Stress : Not at all  Social Connections: Socially Integrated   Frequency of Communication with Friends and Family: More than three times a week   Frequency of Social Gatherings with Friends and Family: More than three times a week   Attends Religious Services: More than 4 times per year   Active Member of [employer] or Organizations: Yes   Attends Music therapist: More than 4 times per year   Marital Status: Married  Human resources officer Violence: Not At Risk   Fear of Current or Ex-Partner: No   Emotionally Abused: No   Physically Abused: No   Sexually Abused: No    FAMILY HISTORY: Family History  Problem Relation Age of Onset   Hypertension Mother    Hyperlipidemia Mother    Arthritis Father    Diabetes Paternal Grandmother 18    ALLERGIES:  has No Known Allergies.  MEDICATIONS:  Current Outpatient Medications  Medication Sig  Dispense Refill   apixaban (ELIQUIS) 5 MG TABS tablet 1 tab po bid 180 tablet 1   atorvastatin (LIPITOR) 10 MG tablet Take 1 tablet (10 mg total) by mouth daily. 90 tablet 3   fenofibrate (TRICOR) 145 MG tablet Take 1 tablet (145 mg total) by mouth daily. 90 tablet 3   fluticasone (FLONASE) 50 MCG/ACT nasal spray Place 1 spray into both nostrils daily.     fosinopril (MONOPRIL) 20 MG tablet Take 1 tablet (20 mg total) by mouth every morning. 90 tablet 3   hydrochlorothiazide (HYDRODIURIL) 25 MG tablet Take 0.5 tablets (12.5 mg total) by mouth daily. 45 tablet 3   omeprazole (PRILOSEC) 40 MG capsule Take 40 mg by mouth daily.     Polyethyl Glycol-Propyl Glycol (SYSTANE OP) Place 1 drop into both eyes as needed (dry eyes).     No current facility-administered medications for this visit.    REVIEW OF SYSTEMS:   Constitutional: ( - ) fevers, ( - )  chills , ( - ) night sweats Eyes: ( - ) blurriness of vision, ( - ) double vision, ( - ) watery eyes Ears, nose, mouth, throat, and face: ( - ) mucositis, ( - ) sore throat Respiratory: ( - ) cough, ( - ) dyspnea, ( - ) wheezes Cardiovascular: ( - ) palpitation, ( - ) chest discomfort, ( - ) lower extremity swelling Gastrointestinal:  ( - ) nausea, ( - ) heartburn, ( - ) change in bowel habits Skin: ( - ) abnormal skin rashes Lymphatics: ( - ) new lymphadenopathy, ( - ) easy bruising Neurological: ( - ) numbness, ( - ) tingling, ( - ) new weaknesses Behavioral/Psych: ( - ) mood change, ( - ) new changes  All other systems were reviewed with the patient and are negative.  PHYSICAL EXAMINATION: ECOG PERFORMANCE STATUS: 0 - Asymptomatic  Vitals:   01/15/21 0955  BP: 129/76  Pulse: (!) 58  Resp: 18  Temp: 98.5 F (36.9 C)  SpO2: 99%   Filed Weights   01/15/21 0955  Weight: 196 lb (88.9 kg)    GENERAL: well appearing male in NAD  SKIN: skin color, texture, turgor are normal, no rashes or significant lesions EYES: conjunctiva are pink  and non-injected, sclera clear OROPHARYNX: no exudate, no erythema; lips, buccal mucosa, and tongue normal  LUNGS: clear to auscultation and percussion with normal breathing effort HEART: regular rate & rhythm and no murmurs and no lower extremity edema ABDOMEN: soft, non-tender, non-distended, normal bowel sounds Musculoskeletal: no cyanosis of digits and no clubbing  PSYCH: alert & oriented x 3, fluent speech NEURO: no focal motor/sensory deficits  LABORATORY DATA:  I have reviewed the data as listed CBC Latest Ref Rng & Units 01/15/2021 10/16/2020 09/30/2020  WBC 4.0 - 10.5 K/uL 5.2 6.2 7.6  Hemoglobin 13.0 - 17.0 g/dL 13.3 12.5(L) 12.1(L)  Hematocrit 39.0 - 52.0 % 38.8(L) 37.0(L) 36.4(L)  Platelets 150 - 400 K/uL 197 252 370.0    CMP Latest Ref Rng & Units 01/15/2021 10/16/2020 09/30/2020  Glucose 70 - 99 mg/dL 74 91 84  BUN 8 - 23 mg/dL 20 19 17   Creatinine 0.61 - 1.24 mg/dL 1.15 1.07 1.04  Sodium 135 - 145 mmol/L 138 135 131(L)  Potassium 3.5 - 5.1 mmol/L 4.2 3.9 4.1  Chloride 98 - 111 mmol/L 105 102 96  CO2 22 - 32 mmol/L 24 24 24   Calcium 8.9 - 10.3 mg/dL 9.7 9.7 9.0  Total Protein 6.5 - 8.1 g/dL 7.4 7.9 -  Total Bilirubin 0.3 - 1.2 mg/dL 0.5 0.5 -  Alkaline Phos 38 - 126 U/L 56 57 -  AST 15 - 41 U/L 35 27 -  ALT 0 - 44 U/L 22 19 -    RADIOGRAPHIC STUDIES: I have personally reviewed the radiological images as listed and agreed with the findings in the report. No results found.  ASSESSMENT & PLAN Shawn [name] is a 75 y.o. male returns for routine follow-up for history of pulmonary emboli.  # H/O pulmonary emboli involving the right lower lobe: -Unprovoked VTE so recommendation is indefinite anticoagulation. -Currently on Eliqius 5 mg BID and tolerating well.   -Labs from 10/16/2020 showed mild elevation of anticardiolipin IgG and IgM antibodies and normal beta-2 glycoprotein antibodies.Does not meet criteria for antiphospholipid syndrome.  -Reviewed CBC and CMP today, no  intervention is required. Anemia has resolved with Hgb of 13.3.  -RTC in 3 months with labs. Will consider transitioning to maintenance dose at  that time.   No orders of the defined types were placed in this encounter.   All questions were answered. The patient knows to call the clinic with any problems, questions or concerns.  I have spent a total of 25 minutes minutes of face-to-face and non-face-to-face time, preparing to see the patient, performing a medically appropriate examination, counseling and educating the patient, ordering tests, documenting clinical information in the electronic health record, and care coordination.   Dede Query, PA-C Department of Hematology/Oncology [place] at [facility] Phone: [phone]

## 2021-03-03 DIAGNOSIS — K317 Polyp of stomach and duodenum: Secondary | ICD-10-CM | POA: Diagnosis not present

## 2021-03-03 DIAGNOSIS — K2270 Barrett's esophagus without dysplasia: Secondary | ICD-10-CM | POA: Diagnosis not present

## 2021-03-03 DIAGNOSIS — K219 Gastro-esophageal reflux disease without esophagitis: Secondary | ICD-10-CM | POA: Diagnosis not present

## 2021-03-03 DIAGNOSIS — K2289 Other specified disease of esophagus: Secondary | ICD-10-CM | POA: Diagnosis not present

## 2021-03-10 ENCOUNTER — Encounter: Payer: Self-pay | Admitting: Family Medicine

## 2021-03-11 ENCOUNTER — Other Ambulatory Visit: Payer: Self-pay | Admitting: Family Medicine

## 2021-04-17 ENCOUNTER — Other Ambulatory Visit: Payer: Self-pay | Admitting: Physician Assistant

## 2021-04-18 ENCOUNTER — Inpatient Hospital Stay: Payer: Medicare Other

## 2021-04-18 ENCOUNTER — Inpatient Hospital Stay: Payer: Medicare Other | Attending: Physician Assistant | Admitting: Physician Assistant

## 2021-04-18 ENCOUNTER — Other Ambulatory Visit: Payer: Self-pay

## 2021-04-18 VITALS — BP 118/77 | HR 56 | Temp 97.0°F | Resp 17 | Wt 196.1 lb

## 2021-04-18 DIAGNOSIS — R072 Precordial pain: Secondary | ICD-10-CM | POA: Diagnosis not present

## 2021-04-18 DIAGNOSIS — Z8249 Family history of ischemic heart disease and other diseases of the circulatory system: Secondary | ICD-10-CM | POA: Insufficient documentation

## 2021-04-18 DIAGNOSIS — R1010 Upper abdominal pain, unspecified: Secondary | ICD-10-CM | POA: Diagnosis not present

## 2021-04-18 DIAGNOSIS — R768 Other specified abnormal immunological findings in serum: Secondary | ICD-10-CM | POA: Diagnosis not present

## 2021-04-18 DIAGNOSIS — Z8719 Personal history of other diseases of the digestive system: Secondary | ICD-10-CM | POA: Diagnosis not present

## 2021-04-18 DIAGNOSIS — Z86711 Personal history of pulmonary embolism: Secondary | ICD-10-CM | POA: Diagnosis not present

## 2021-04-18 DIAGNOSIS — Z833 Family history of diabetes mellitus: Secondary | ICD-10-CM | POA: Diagnosis not present

## 2021-04-18 DIAGNOSIS — Z8261 Family history of arthritis: Secondary | ICD-10-CM | POA: Insufficient documentation

## 2021-04-18 DIAGNOSIS — I2699 Other pulmonary embolism without acute cor pulmonale: Secondary | ICD-10-CM | POA: Diagnosis not present

## 2021-04-18 DIAGNOSIS — Z79899 Other long term (current) drug therapy: Secondary | ICD-10-CM | POA: Insufficient documentation

## 2021-04-18 DIAGNOSIS — Z86010 Personal history of colonic polyps: Secondary | ICD-10-CM | POA: Insufficient documentation

## 2021-04-18 DIAGNOSIS — Z8349 Family history of other endocrine, nutritional and metabolic diseases: Secondary | ICD-10-CM | POA: Diagnosis not present

## 2021-04-18 DIAGNOSIS — Z7901 Long term (current) use of anticoagulants: Secondary | ICD-10-CM | POA: Insufficient documentation

## 2021-04-18 DIAGNOSIS — Z9049 Acquired absence of other specified parts of digestive tract: Secondary | ICD-10-CM | POA: Insufficient documentation

## 2021-04-18 LAB — CMP (CANCER CENTER ONLY)
ALT: 17 U/L (ref 0–44)
AST: 29 U/L (ref 15–41)
Albumin: 4.3 g/dL (ref 3.5–5.0)
Alkaline Phosphatase: 46 U/L (ref 38–126)
Anion gap: 8 (ref 5–15)
BUN: 22 mg/dL (ref 8–23)
CO2: 24 mmol/L (ref 22–32)
Calcium: 9.7 mg/dL (ref 8.9–10.3)
Chloride: 103 mmol/L (ref 98–111)
Creatinine: 1.06 mg/dL (ref 0.61–1.24)
GFR, Estimated: >60 mL/min (ref >60)
Glucose, Bld: 91 mg/dL (ref 70–99)
Potassium: 3.8 mmol/L (ref 3.5–5.1)
Sodium: 135 mmol/L (ref 135–145)
Total Bilirubin: 0.6 mg/dL (ref 0.3–1.2)
Total Protein: 7.3 g/dL (ref 6.5–8.1)

## 2021-04-18 LAB — CBC WITH DIFFERENTIAL (CANCER CENTER ONLY)
Abs Immature Granulocytes: 0.01 10*3/uL (ref 0.00–0.07)
Basophils Absolute: 0 10*3/uL (ref 0.0–0.1)
Basophils Relative: 1 %
Eosinophils Absolute: 0.1 10*3/uL (ref 0.0–0.5)
Eosinophils Relative: 2 %
HCT: 39.6 % (ref 39.0–52.0)
Hemoglobin: 13.1 g/dL (ref 13.0–17.0)
Immature Granulocytes: 0 %
Lymphocytes Relative: 32 %
Lymphs Abs: 1.5 10*3/uL (ref 0.7–4.0)
MCH: 27.1 pg (ref 26.0–34.0)
MCHC: 33.1 g/dL (ref 30.0–36.0)
MCV: 81.8 fL (ref 80.0–100.0)
Monocytes Absolute: 0.6 10*3/uL (ref 0.1–1.0)
Monocytes Relative: 12 %
Neutro Abs: 2.5 10*3/uL (ref 1.7–7.7)
Neutrophils Relative %: 53 %
Platelet Count: 197 10*3/uL (ref 150–400)
RBC: 4.84 MIL/uL (ref 4.22–5.81)
RDW: 16 % — ABNORMAL HIGH (ref 11.5–15.5)
WBC Count: 4.6 10*3/uL (ref 4.0–10.5)
nRBC: 0 % (ref 0.0–0.2)

## 2021-04-18 NOTE — Progress Notes (Signed)
[place] [phone]   Fax:(336) [phone]  PROGRESS NOTE  Patient Care Team: Tammi Sou, MD as PCP - General (Family Medicine) Juanita Craver, MD as Consulting Physician (Gastroenterology) Francee Gentile, [place] as Referring Physician (Chiropractic Medicine) Romero Belling, MD as Referring Physician (Dermatology) Bjorn Loser, MD as Consulting Physician (Urology)  Hematological/Oncological History 1) 09/18/2020-09/21/2020: Admitted for acute pulmonary emboli after presenting to the ED shortness of breath, sternal chest pain and upper abdominal pain. CTA chest confirmed multi segment pulmonary emboli in the right lower lobe with pulmonary infarct/ischemia. No evidence of heart strain. Patient was started on heparin and transitioned to Eliquis.   2) 10/16/2020: Establish care with Dede Query PA-C. Recommended indefinite anticoagulation due to unprovoked PE.   3) 04/18/2021: Recommend to transition to maintenance dose of Eliquis 2.5 mg twice daily.   CHIEF COMPLAINTS/PURPOSE OF CONSULTATION:  Hx of unprovoked pulmonary emboli   HISTORY OF PRESENTING ILLNESS:  Shawn [name] 76 y.o. male returns for follow-up for history of unprovoked pulmonary emboli currently on indefinite anticoagulation with Eliquis.  She is unaccompanied for this visit.  On exam today, Shawn [name] continues to do well without any new symptoms. His energy and appetite levels are stable. He is tolerating Eliquis without any complications. He is eating well without any weight changes. He denies nausea, vomiting or abdominal pain. He has regular bowel movements without any diarrhea or constipation.  Patient denies easy bruising or signs of active bleeding.  He denies any fevers, chills, shortness of breath, chest pain or cough.He has no other complaints. Rest of the 10 point ROS is below.  MEDICAL HISTORY:  Past Medical History:  Diagnosis Date   Acute urinary retention 12/2018   ?BPH    Asymmetrical sensorineural hearing loss 2021   Likely from noise exposure.  [facility] [phone] ENT referral for this dx being relatively new.   Barrett's esophagus    Dr. Collene Mares   Cataract    Chronic renal insufficiency, stage 2 (mild) 2017   Stage II/III (GFR 60 ml/min)   GERD (gastroesophageal reflux disease)    History of adenomatous polyp of colon 09/2007   Polyps at 1st screening colonoscopy; 2014 no polyps.  Rpt 12/29/17: adenomatous polyp-->  Recall 5 yrs.   Hyperlipemia, mixed    Pt intol of welchol; fenofibrate helpful.  Fenofib inc to 145 and atorva 10mg  started 08/23/18-->great lipid panel 11/2018.   Hypertension    Pulmonary embolism ([place]) 09/2020   with pulm infarct.  Unprovoked->hosp ref to hem/onc   Subclinical hypothyroidism    per old PCP records.  TPO ab's elevated mildly 12/2017, with TSH 6.6 and normal T4 and T3==>developing Hashimoto's.    Thrombocytopenia ([place])    Hx of, mild/stable with monitoring per old PCP records    SURGICAL HISTORY: Past Surgical History:  Procedure Laterality Date   APPENDECTOMY     CATARACT EXTRACTION Bilateral 04/13/2018   05/14/2018   COLONOSCOPY  12/29/2017   polypectomy --path - adenoma,  Repeat in 5 years   COLONOSCOPY W/ POLYPECTOMY  2014   per Dr. Collene Mares, recall 02/2023   ESOPHAGOGASTRODUODENOSCOPY  2014; 2016; 11/16/16   2018--Barrett's esoph reconfirmed   TRANSTHORACIC ECHOCARDIOGRAM  09/19/2020   (in the setting of acute PE)->EF 70-75%, RV fxn mildly reduced, hyperdynamic LV fxn, normal PA pressure, no valve probs.   UPPER GI ENDOSCOPY  03/03/2019   2020.  03/03/21->small patch Barretts, multiple sessile gastric polyps, normal duodenum. Rpt 3 yrs.    SOCIAL  HISTORY: Social History   Socioeconomic History   Marital status: Married    Spouse name: Not on file   Number of children: Not on file   Years of education: Not on file   Highest education level: Not on file  Occupational History   Not on file  Tobacco  Use   Smoking status: Never   Smokeless tobacco: Never  Vaping Use   Vaping Use: Never used  Substance and Sexual Activity   Alcohol use: No   Drug use: No   Sexual activity: Not on file  Other Topics Concern   Not on file  Social History Narrative   Married, grand-daughter lives with him.   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   Orig from [place] and [place].   No T/A/Ds.   Exercises 3 X/week--walking 2-3 miles.   Social Determinants of Health   Financial Resource Strain: Low Risk    Difficulty of Paying Living Expenses: Not hard at all  Food Insecurity: No Food Insecurity   Worried About Charity fundraiser in the Last Year: Never true   [place] in the Last Year: Never true  Transportation Needs: No Transportation Needs   Lack of Transportation (Medical): No   Lack of Transportation (Non-Medical): No  Physical Activity: Unknown   Days of Exercise per Week: Not on file   Minutes of Exercise per Session: 60 min  Stress: No Stress Concern Present   Feeling of Stress : Not at all  Social Connections: Socially Integrated   Frequency of Communication with Friends and Family: More than three times a week   Frequency of Social Gatherings with Friends and Family: More than three times a week   Attends Religious Services: More than 4 times per year   Active Member of [employer] or Organizations: Yes   Attends Music therapist: More than 4 times per year   Marital Status: Married  Human resources officer Violence: Not At Risk   Fear of Current or Ex-Partner: No   Emotionally Abused: No   Physically Abused: No   Sexually Abused: No    FAMILY HISTORY: Family History  Problem Relation Age of Onset   Hypertension Mother    Hyperlipidemia Mother    Arthritis Father    Diabetes Paternal Grandmother 73    ALLERGIES:  has No Known Allergies.  MEDICATIONS:  Current Outpatient Medications  Medication Sig Dispense Refill    atorvastatin (LIPITOR) 10 MG tablet Take 1 tablet (10 mg total) by mouth daily. 90 tablet 3   ELIQUIS 5 MG TABS tablet TAKE 1 TABLET TWICE A DAY 180 tablet 1   fenofibrate (TRICOR) 145 MG tablet Take 1 tablet (145 mg total) by mouth daily. 90 tablet 3   fosinopril (MONOPRIL) 20 MG tablet Take 1 tablet (20 mg total) by mouth every morning. 90 tablet 3   hydrochlorothiazide (HYDRODIURIL) 25 MG tablet Take 0.5 tablets (12.5 mg total) by mouth daily. 45 tablet 3   omeprazole (PRILOSEC) 40 MG capsule Take 40 mg by mouth daily.     Polyethyl Glycol-Propyl Glycol (SYSTANE OP) Place 1 drop into both eyes as needed (dry eyes).     No current facility-administered medications for this visit.    REVIEW OF SYSTEMS:   Constitutional: ( - ) fevers, ( - )  chills , ( - ) night sweats Eyes: ( - ) blurriness of vision, ( - ) double vision, ( - ) watery eyes Ears, nose,  mouth, throat, and face: ( - ) mucositis, ( - ) sore throat Respiratory: ( - ) cough, ( - ) dyspnea, ( - ) wheezes Cardiovascular: ( - ) palpitation, ( - ) chest discomfort, ( - ) lower extremity swelling Gastrointestinal:  ( - ) nausea, ( - ) heartburn, ( - ) change in bowel habits Skin: ( - ) abnormal skin rashes Lymphatics: ( - ) new lymphadenopathy, ( - ) easy bruising Neurological: ( - ) numbness, ( - ) tingling, ( - ) new weaknesses Behavioral/Psych: ( - ) mood change, ( - ) new changes  All other systems were reviewed with the patient and are negative.  PHYSICAL EXAMINATION: ECOG PERFORMANCE STATUS: 0 - Asymptomatic  Vitals:   04/18/21 1035  BP: 118/77  Pulse: (!) 56  Resp: 17  Temp: (!) 97 F (36.1 C)  SpO2: 97%   Filed Weights   04/18/21 1035  Weight: 196 lb 1 oz (88.9 kg)    GENERAL: well appearing male in NAD  SKIN: skin color, texture, turgor are normal, no rashes or significant lesions EYES: conjunctiva are pink and non-injected, sclera clear OROPHARYNX: no exudate, no erythema; lips, buccal mucosa, and tongue  normal  LUNGS: clear to auscultation and percussion with normal breathing effort HEART: regular rate & rhythm and no murmurs and no lower extremity edema ABDOMEN: soft, non-tender, non-distended, normal bowel sounds Musculoskeletal: no cyanosis of digits and no clubbing  PSYCH: alert & oriented x 3, fluent speech NEURO: no focal motor/sensory deficits  LABORATORY DATA:  I have reviewed the data as listed CBC Latest Ref Rng & Units 04/18/2021 01/15/2021 10/16/2020  WBC 4.0 - 10.5 K/uL 4.6 5.2 6.2  Hemoglobin 13.0 - 17.0 g/dL 13.1 13.3 12.5(L)  Hematocrit 39.0 - 52.0 % 39.6 38.8(L) 37.0(L)  Platelets 150 - 400 K/uL 197 197 252    CMP Latest Ref Rng & Units 04/18/2021 01/15/2021 10/16/2020  Glucose 70 - 99 mg/dL 91 74 91  BUN 8 - 23 mg/dL 22 20 19   Creatinine 0.61 - 1.24 mg/dL 1.06 1.15 1.07  Sodium 135 - 145 mmol/L 135 138 135  Potassium 3.5 - 5.1 mmol/L 3.8 4.2 3.9  Chloride 98 - 111 mmol/L 103 105 102  CO2 22 - 32 mmol/L 24 24 24   Calcium 8.9 - 10.3 mg/dL 9.7 9.7 9.7  Total Protein 6.5 - 8.1 g/dL 7.3 7.4 7.9  Total Bilirubin 0.3 - 1.2 mg/dL 0.6 0.5 0.5  Alkaline Phos 38 - 126 U/L 46 56 57  AST 15 - 41 U/L 29 35 27  ALT 0 - 44 U/L 17 22 19     RADIOGRAPHIC STUDIES: I have personally reviewed the radiological images as listed and agreed with the findings in the report. No results found.  ASSESSMENT & PLAN Shawn [name] is a 77 y.o. male returns for routine follow-up for history of pulmonary emboli.  # H/O pulmonary emboli involving the right lower lobe: -Unprovoked VTE so recommendation is indefinite anticoagulation. -Labs from 10/16/2020 showed mild elevation of anticardiolipin IgG and IgM antibodies and normal beta-2 glycoprotein antibodies.Does not meet criteria for antiphospholipid syndrome.  -Reviewed CBC and CMP today, no intervention is required.  -Currently on Eliquis 5 mg twice daily. Since patient has been on Eliquis for 6 months, he is eligible for maintenance 2.5 mg twice  daily.  -RTC in 6 months with repeat labs.   No orders of the defined types were placed in this encounter.   All questions were answered. The patient  knows to call the clinic with any problems, questions or concerns.  I have spent a total of 25 minutes minutes of face-to-face and non-face-to-face time, preparing to see the patient, performing a medically appropriate examination, counseling and educating the patient, ordering tests, documenting clinical information in the electronic health record, and care coordination.   Dede Query, PA-C Department of Hematology/Oncology [place] at [facility] Phone: [phone]

## 2021-05-03 ENCOUNTER — Encounter: Payer: Self-pay | Admitting: Family Medicine

## 2021-05-05 MED ORDER — APIXABAN 2.5 MG PO TABS: 2.5000 mg | ORAL_TABLET | Freq: Two times a day (BID) | ORAL | 3 refills | Status: DC

## 2021-05-05 NOTE — Telephone Encounter (Signed)
Pt was last seen 12/05/20 and mentioned in OV note: "4) Hx of unprovoked PE, neg hypercoag w/u. Hem/onc recommends eliquis 5mg  bid indefinitely. Discussed avoidance of any activities that would put him at risk for injury/bleeding. CBC normal 1 mo ago."  Pt is due for appt next month. Last rx completed 03/11/21(180,1)   Please review and advise

## 2021-05-05 NOTE — Telephone Encounter (Signed)
OK, new eRx sent

## 2021-05-19 ENCOUNTER — Other Ambulatory Visit: Payer: Self-pay | Admitting: Family Medicine

## 2021-06-05 ENCOUNTER — Other Ambulatory Visit: Payer: Self-pay

## 2021-06-06 ENCOUNTER — Ambulatory Visit (INDEPENDENT_AMBULATORY_CARE_PROVIDER_SITE_OTHER): Payer: Medicare Other | Admitting: Family Medicine

## 2021-06-06 ENCOUNTER — Encounter: Payer: Self-pay | Admitting: Family Medicine

## 2021-06-06 VITALS — BP 116/76 | HR 57 | Temp 97.5°F | Ht 70.0 in | Wt 194.8 lb

## 2021-06-06 DIAGNOSIS — E038 Other specified hypothyroidism: Secondary | ICD-10-CM | POA: Diagnosis not present

## 2021-06-06 DIAGNOSIS — I10 Essential (primary) hypertension: Secondary | ICD-10-CM

## 2021-06-06 DIAGNOSIS — E7800 Pure hypercholesterolemia, unspecified: Secondary | ICD-10-CM

## 2021-06-06 DIAGNOSIS — Z86711 Personal history of pulmonary embolism: Secondary | ICD-10-CM

## 2021-06-06 LAB — LIPID PANEL
Cholesterol: 136 mg/dL (ref 0–200)
HDL: 41.5 mg/dL (ref >39.00)
LDL Cholesterol: 80 mg/dL (ref 0–99)
NonHDL: 94.12
Total CHOL/HDL Ratio: 3
Triglycerides: 71 mg/dL (ref 0.0–149.0)
VLDL: 14.2 mg/dL (ref 0.0–40.0)

## 2021-06-06 LAB — TSH: TSH: 5.22 u[IU]/mL (ref 0.35–5.50)

## 2021-06-06 LAB — T4, FREE: Free T4: 0.82 ng/dL (ref 0.60–1.60)

## 2021-06-06 NOTE — Progress Notes (Signed)
OFFICE VISIT  06/06/2021  CC:  Chief Complaint  Patient presents with   Hypertension    RCI; fasting   Hyperlipidemia   HPI:    Patient is a 76 y.o. male who presents for 6 mo f/u HTN, HLD, subclin hypoth, hx of unprovoked PE. A/P as of last visit: "1) HTN: well controlled. Cont fosinopril 20 qd and 12.5mg  hctz qd. Lytes cr consistently normal, most recently 1 mo ago.   2) Mixed HLD: tolerating statin and fenofibrate. FLP today.  Hepatic panel normal 1 mo ago. He expressed some interest on possibly trying to go off these meds in future, depending on lipid levels today.   3) Subclin hypoth: thyroid panel today.   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 Hem/onc recommends eliquis 5mg  bid indefinitely. Discussed avoidance of any activities that would put him at risk for injury/bleeding. CBC normal 1 mo ago.   5) Preventative health care: Vaccines: He declines shingrix and Tdap.  Otherwise all vaccines UTD. Colon ca screening: needs repeat colonoscopy 2024. Prostate ca screening: he declines any further PSA screening today."  INTERIM HX: He had follow-up 04/2021 with hematology for his history of unprovoked pulmonary emboli--- was doing well and was changed to the maintenance dosing of Eliquis-> 2.5 mg twice a day. CBC and complete metabolic panel normal at that time. He reports feeling well.  Getting geared up to do some yard work.  He is active, does not like to sit around much at all. Occasional home blood pressure check --normal.  ROS as above, plus--> no fevers, no CP, no SOB, no wheezing, no cough, no dizziness, no HAs, no rashes, no melena/hematochezia.  No polyuria or polydipsia.  No myalgias or arthralgias.  No focal weakness, paresthesias, or tremors.  No acute vision or hearing abnormalities.  No dysuria or unusual/new urinary urgency or frequency.  No recent changes in lower legs. No n/v/d or abd pain.  No palpitations.    Past Medical History:  Diagnosis Date    Acute urinary retention 12/2018   ?BPH   Asymmetrical sensorineural hearing loss 2021   Likely from noise exposure.  [facility] [phone] ENT referral for this dx being relatively new.   Barrett's esophagus    Dr. Collene Mares   Cataract    Chronic renal insufficiency, stage 2 (mild) 2017   Stage II/III (GFR 60 ml/min)   GERD (gastroesophageal reflux disease)    History of adenomatous polyp of colon 09/2007   Polyps at 1st screening colonoscopy; 2014 no polyps.  Rpt 12/29/17: adenomatous polyp-->  Recall 5 yrs.   Hyperlipemia, mixed    Pt intol of welchol; fenofibrate helpful.  Fenofib inc to 145 and atorva 10mg  started 08/23/18-->great lipid panel 11/2018.   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  Unprovoked->hosp ref to hem/onc   Subclinical hypothyroidism    per old PCP records.  TPO ab's elevated mildly 12/2017, with TSH 6.6 and normal T4 and T3==>developing Hashimoto's.    Thrombocytopenia ([place])    Hx of, mild/stable with monitoring per old PCP records    Past Surgical History:  Procedure Laterality Date   APPENDECTOMY     CATARACT EXTRACTION Bilateral 04/13/2018   05/14/2018   COLONOSCOPY  12/29/2017   polypectomy --path - adenoma,  Repeat in 5 years   COLONOSCOPY W/ POLYPECTOMY  2014   per Dr. Collene Mares, recall 02/2023   ESOPHAGOGASTRODUODENOSCOPY  2014; 2016; 11/16/16   2018--Barrett's esoph reconfirmed   TRANSTHORACIC  ECHOCARDIOGRAM  09/19/2020   (in the setting of acute PE)->EF 70-75%, RV fxn mildly reduced, hyperdynamic LV fxn, normal PA pressure, no valve probs.   UPPER GI ENDOSCOPY  03/03/2019   2020.  03/03/21->small patch Barretts, multiple sessile gastric polyps, normal duodenum. Rpt 3 yrs.    Outpatient Medications Prior to Visit  Medication Sig Dispense Refill   apixaban (ELIQUIS) 2.5 MG TABS tablet Take 1 tablet (2.5 mg total) by mouth 2 (two) times daily. 180 tablet 3   atorvastatin (LIPITOR) 10 MG tablet Take 1 tablet (10 mg total)  by mouth daily. 90 tablet 3   fenofibrate (TRICOR) 145 MG tablet Take 1 tablet (145 mg total) by mouth daily. 90 tablet 3   fosinopril (MONOPRIL) 20 MG tablet TAKE 1 TABLET EVERY MORNING 90 tablet 1   hydrochlorothiazide (HYDRODIURIL) 25 MG tablet Take 0.5 tablets (12.5 mg total) by mouth daily. 45 tablet 3   omeprazole (PRILOSEC) 40 MG capsule Take 40 mg by mouth daily.     Polyethyl Glycol-Propyl Glycol (SYSTANE OP) Place 1 drop into both eyes as needed (dry eyes).     No facility-administered medications prior to visit.    No Known Allergies  ROS As per HPI  PE: Vitals with BMI 06/06/2021 04/18/2021 01/15/2021  Height 5\' 10"  - -  Weight 194 lbs 13 oz 196 lbs 1 oz 196 lbs  BMI 88.50 - -  Systolic 277 412 878  Diastolic 76 77 76  Pulse 57 56 58     Physical Exam  Gen: Alert, well appearing.  Patient is oriented to person, place, time, and situation. AFFECT: pleasant, lucid thought and speech. CV: RRR, no m/r/g.   LUNGS: CTA bilat, nonlabored resps, good aeration in all lung fields. EXT: no clubbing or cyanosis.  no edema.    LABS:  Last CBC Lab Results  Component Value Date   WBC 4.6 04/18/2021   HGB 13.1 04/18/2021   HCT 39.6 04/18/2021   MCV 81.8 04/18/2021   MCH 27.1 04/18/2021   RDW 16.0 (H) 04/18/2021   PLT 197 67/67/2094   Last metabolic panel Lab Results  Component Value Date   GLUCOSE 91 04/18/2021   NA 135 04/18/2021   K 3.8 04/18/2021   CL 103 04/18/2021   CO2 24 04/18/2021   BUN 22 04/18/2021   CREATININE 1.06 04/18/2021   GFRNONAA >60 04/18/2021   CALCIUM 9.7 04/18/2021   PROT 7.3 04/18/2021   ALBUMIN 4.3 04/18/2021   BILITOT 0.6 04/18/2021   ALKPHOS 46 04/18/2021   AST 29 04/18/2021   ALT 17 04/18/2021   ANIONGAP 8 04/18/2021   Last lipids Lab Results  Component Value Date   CHOL 128 12/05/2020   HDL 45.20 12/05/2020   LDLCALC 70 12/05/2020   TRIG 62.0 12/05/2020   CHOLHDL 3 12/05/2020   Last thyroid functions Lab Results   Component Value Date   TSH 5.54 (H) 12/05/2020   T3TOTAL 94 12/05/2020   IMPRESSION AND PLAN:  #1 hypertension, well controlled. Cont fosinopril 20 qd and 12.5mg  hctz qd. Lytes cr consistently normal, most recently about 6 weeks ago.  2 hyperlipidemia,mixed, tolerating statin and fibrate. FLP today.  Hepatic panel normal about 6 wks ago.  #3 subclinical hypothyroidism.  TPO ab elev 2019. Thyroid panel today.  #4  History of pulmonary emboli-->09/2020. Chronic anticoagulation with Eliquis 2.5 mg twice a day--indefinitely.  He is followed by hematology. CBC was normal 6 weeks ago  #5 Preventative health care: Vaccines: He declines shingrix  and Tdap.  Otherwise all vaccines UTD. Colon ca screening: needs repeat colonoscopy 2024. Prostate ca screening: he declines any further PSA screening.  An After Visit Summary was printed and given to the patient.  FOLLOW UP: Return for routine chronic illness f/u.  Signed:  Crissie Sickles, MD           06/06/2021

## 2021-06-07 LAB — T3: T3, Total: 92 ng/dL (ref 76–181)

## 2021-06-12 ENCOUNTER — Encounter: Payer: Self-pay | Admitting: Family Medicine

## 2021-06-26 ENCOUNTER — Other Ambulatory Visit: Payer: Self-pay

## 2021-06-27 ENCOUNTER — Encounter: Payer: Self-pay | Admitting: Family Medicine

## 2021-06-27 ENCOUNTER — Ambulatory Visit (INDEPENDENT_AMBULATORY_CARE_PROVIDER_SITE_OTHER): Payer: Medicare Other | Admitting: Family Medicine

## 2021-06-27 VITALS — BP 110/70 | HR 59 | Temp 97.3°F | Ht 70.0 in | Wt 196.0 lb

## 2021-06-27 DIAGNOSIS — L509 Urticaria, unspecified: Secondary | ICD-10-CM | POA: Diagnosis not present

## 2021-06-27 MED ORDER — EPINEPHRINE 0.3 MG/0.3ML IJ SOAJ: 0.3000 mg | INTRAMUSCULAR | 1 refills | Status: AC | PRN

## 2021-06-27 NOTE — Progress Notes (Signed)
OFFICE VISIT ? ?06/27/2021 ? ?CC:  ?Chief Complaint  ?Patient presents with  ? Allergic Reaction  ?  Pt reports having possible mild reaction to Eliquis. Starting occurring after PE and starting Eliquis. Spots on his arms about the size of a quarter, 3 bumps on his lip, inflammation of scrotum/penal area. Each event went away after 24 hours  ? ?HPI:   ? ?Patient is a 76 y.o. male who presents for concern of medication reaction. ?I last saw him 06/06/21. ?A/P as of that visit: ?"#1 hypertension, well controlled. ?Cont fosinopril 20 qd and 12.'5mg'$  hctz qd. ?Lytes cr consistently normal, most recently about 6 weeks ago. ?  ?2 hyperlipidemia,mixed, tolerating statin and fibrate. ?FLP today.  Hepatic panel normal about 6 wks ago. ?  ?#3 subclinical hypothyroidism.  TPO ab elev 2019. ?Thyroid panel today. ?  ?#4  History of pulmonary emboli-->09/2020. ?Chronic anticoagulation with Eliquis 2.5 mg twice a day--indefinitely.  He is followed by hematology. ?CBC was normal 6 weeks ago ?  ?#5 Preventative health care: ?Vaccines: He declines shingrix and Tdap.  Otherwise all vaccines UTD. ?Colon ca screening: needs repeat colonoscopy 2024. ?Prostate ca screening: he declines any further PSA screening." ? ?INTERIM HX: ?Labs all excellent last visit. ?Marlou Sa says that ever since he started on Eliquis he has noted an occasional hive on the inner aspect of each wrist that lasts about 24 hours.  Also occasionally will notice the upper lip is diffusely swollen and itchy, again resolved in about 24 hours without treatment. ?These things happen approximately every 2 to 3 weeks. ?What prompted his visit today was that he recently had similar diffuse edematous skin on his penis. ? ?He cannot identify any triggers to these episodes. ?Denies any nosebleeds, bruising, blood in stool, or blood in urine. ? ?He has not had any wheezing or shortness of breath.  No tongue swelling or eyes swelling.  No dizziness, no palpitations, no chest pain. ? ?Past  Medical History:  ?Diagnosis Date  ? Acute urinary retention 12/2018  ? ?BPH  ? Asymmetrical sensorineural hearing loss 2021  ? Likely from noise exposure.  [facility] [phone] ENT referral for this dx being relatively new.  ? Barrett's esophagus   ? Dr. Collene Mares  ? Cataract   ? Chronic renal insufficiency, stage 2 (mild) 2017  ? Stage II/III (GFR 60 ml/min)  ? GERD (gastroesophageal reflux disease)   ? History of adenomatous polyp of colon 09/2007  ? Polyps at 1st screening colonoscopy; 2014 no polyps.  Rpt 12/29/17: adenomatous polyp-->  Recall 5 yrs.  ? Hyperlipemia, mixed   ? Pt intol of welchol; fenofibrate helpful.  Fenofib inc to 145 and atorva '10mg'$  started 08/23/18-->great lipid panel 11/2018.  ? Hypertension   ? Pulmonary embolism ([place]) 09/2020  ? with pulm infarct.  Unprovoked->hosp ref to hem/onc  ? Subclinical hypothyroidism   ? per old PCP records.  TPO ab's elevated mildly 12/2017, with TSH 6.6 and normal T4 and T3==>developing Hashimoto's.   ? Thrombocytopenia ([place])   ? Hx of, mild/stable with monitoring per old PCP records  ? ? ?Past Surgical History:  ?Procedure Laterality Date  ? APPENDECTOMY    ? CATARACT EXTRACTION Bilateral 04/13/2018  ? 05/14/2018  ? COLONOSCOPY  12/29/2017  ? polypectomy --path - adenoma,  Repeat in 5 years  ? COLONOSCOPY W/ POLYPECTOMY  2014  ? per Dr. Collene Mares, recall 02/2023  ? ESOPHAGOGASTRODUODENOSCOPY  2014; 2016; 11/16/16  ? 2018--Barrett's esoph reconfirmed  ? TRANSTHORACIC ECHOCARDIOGRAM  09/19/2020  ? (  in the setting of acute PE)->EF 70-75%, RV fxn mildly reduced, hyperdynamic LV fxn, normal PA pressure, no valve probs.  ? UPPER GI ENDOSCOPY  03/03/2019  ? 2020.  03/03/21->small patch Barretts, multiple sessile gastric polyps, normal duodenum. Rpt 3 yrs.  ? ? ?Outpatient Medications Prior to Visit  ?Medication Sig Dispense Refill  ? apixaban (ELIQUIS) 2.5 MG TABS tablet Take 1 tablet (2.5 mg total) by mouth 2 (two) times daily. 180 tablet 3  ? atorvastatin (LIPITOR) 10  MG tablet Take 1 tablet (10 mg total) by mouth daily. 90 tablet 3  ? fenofibrate (TRICOR) 145 MG tablet Take 1 tablet (145 mg total) by mouth daily. 90 tablet 3  ? fosinopril (MONOPRIL) 20 MG tablet TAKE 1 TABLET EVERY MORNING 90 tablet 1  ? hydrochlorothiazide (HYDRODIURIL) 25 MG tablet Take 0.5 tablets (12.5 mg total) by mouth daily. 45 tablet 3  ? omeprazole (PRILOSEC) 40 MG capsule Take 40 mg by mouth daily.    ? Polyethyl Glycol-Propyl Glycol (SYSTANE OP) Place 1 drop into both eyes as needed (dry eyes).    ? ?No facility-administered medications prior to visit.  ? ? ?No Known Allergies ? ?ROS ?As per HPI ? ?PE: ?Vitals with BMI 06/27/2021 06/06/2021 04/18/2021  ?Height '5\' 10"'$  '5\' 10"'$  -  ?Weight 196 lbs 194 lbs 13 oz 196 lbs 1 oz  ?BMI 28.12 27.95 -  ?Systolic 546 503 546  ?Diastolic 70 76 77  ?Pulse 59 57 56  ? ? ? ?Physical Exam ? ?General: Alert and well-appearing. ?Skin is normal today.  No lip swelling, no edema of the tongue or eyes. ? ?LABS:  ?Last CBC ?Lab Results  ?Component Value Date  ? WBC 4.6 04/18/2021  ? HGB 13.1 04/18/2021  ? HCT 39.6 04/18/2021  ? MCV 81.8 04/18/2021  ? MCH 27.1 04/18/2021  ? RDW 16.0 (H) 04/18/2021  ? PLT 197 04/18/2021  ? ?Last metabolic panel ?Lab Results  ?Component Value Date  ? GLUCOSE 91 04/18/2021  ? NA 135 04/18/2021  ? K 3.8 04/18/2021  ? CL 103 04/18/2021  ? CO2 24 04/18/2021  ? BUN 22 04/18/2021  ? CREATININE 1.06 04/18/2021  ? GFRNONAA >60 04/18/2021  ? CALCIUM 9.7 04/18/2021  ? PROT 7.3 04/18/2021  ? ALBUMIN 4.3 04/18/2021  ? BILITOT 0.6 04/18/2021  ? ALKPHOS 46 04/18/2021  ? AST 29 04/18/2021  ? ALT 17 04/18/2021  ? ANIONGAP 8 04/18/2021  ? ?No results found for: INR, PROTIME ? ?Last thyroid functions ?Lab Results  ?Component Value Date  ? TSH 5.22 06/06/2021  ? T3TOTAL 92 06/06/2021  ? ?IMPRESSION AND PLAN: ? ?Intermittent urticaria and angioedema. ?Very brief episodes, seemed to have begun after getting on Eliquis.  These are not progressing in frequency or  severity. ?At this point we will have him stay on his Eliquis but I will send in an EpiPen today in case things start to progress.  Signs/symptoms to call or return for were reviewed and pt expressed understanding. ?He will contact his hematologist to discuss this further. ? ?An After Visit Summary was printed and given to the patient. ? ?FOLLOW UP: No follow-ups on file. ? ?Signed:  Crissie Sickles, MD           06/27/2021 ? ?

## 2021-07-08 ENCOUNTER — Other Ambulatory Visit: Payer: Self-pay

## 2021-07-08 ENCOUNTER — Inpatient Hospital Stay: Payer: Medicare Other | Attending: Physician Assistant | Admitting: Physician Assistant

## 2021-07-08 VITALS — BP 130/60 | HR 58 | Temp 98.1°F | Resp 18 | Wt 198.1 lb

## 2021-07-08 DIAGNOSIS — Z833 Family history of diabetes mellitus: Secondary | ICD-10-CM | POA: Diagnosis not present

## 2021-07-08 DIAGNOSIS — Z9049 Acquired absence of other specified parts of digestive tract: Secondary | ICD-10-CM | POA: Insufficient documentation

## 2021-07-08 DIAGNOSIS — R1010 Upper abdominal pain, unspecified: Secondary | ICD-10-CM | POA: Diagnosis not present

## 2021-07-08 DIAGNOSIS — Z8261 Family history of arthritis: Secondary | ICD-10-CM | POA: Diagnosis not present

## 2021-07-08 DIAGNOSIS — Z8349 Family history of other endocrine, nutritional and metabolic diseases: Secondary | ICD-10-CM | POA: Insufficient documentation

## 2021-07-08 DIAGNOSIS — Z8249 Family history of ischemic heart disease and other diseases of the circulatory system: Secondary | ICD-10-CM | POA: Diagnosis not present

## 2021-07-08 DIAGNOSIS — R072 Precordial pain: Secondary | ICD-10-CM | POA: Insufficient documentation

## 2021-07-08 DIAGNOSIS — I2699 Other pulmonary embolism without acute cor pulmonale: Secondary | ICD-10-CM | POA: Insufficient documentation

## 2021-07-08 DIAGNOSIS — Z86711 Personal history of pulmonary embolism: Secondary | ICD-10-CM | POA: Insufficient documentation

## 2021-07-08 DIAGNOSIS — Z86010 Personal history of colonic polyps: Secondary | ICD-10-CM | POA: Diagnosis not present

## 2021-07-08 DIAGNOSIS — Z79899 Other long term (current) drug therapy: Secondary | ICD-10-CM | POA: Insufficient documentation

## 2021-07-08 DIAGNOSIS — Z8719 Personal history of other diseases of the digestive system: Secondary | ICD-10-CM | POA: Insufficient documentation

## 2021-07-08 DIAGNOSIS — E782 Mixed hyperlipidemia: Secondary | ICD-10-CM | POA: Diagnosis not present

## 2021-07-08 DIAGNOSIS — Z7901 Long term (current) use of anticoagulants: Secondary | ICD-10-CM | POA: Diagnosis not present

## 2021-07-08 NOTE — Progress Notes (Signed)
?[place] ?[phone]   [phone] ? ?PROGRESS NOTE ? ?Patient Care Team: ?McGowen, Adrian Blackwater, MD as PCP - General (Family Medicine) ?Juanita Craver, MD as Consulting Physician (Gastroenterology) ?Francee Gentile, [place] as Referring Physician (Chiropractic Medicine) ?Romero Belling, MD as Referring Physician (Dermatology) ?Bjorn Loser, MD as Consulting Physician (Urology) ? ?Hematological/Oncological History ?1) 09/18/2020-09/21/2020: Admitted for acute pulmonary emboli after presenting to the ED shortness of breath, sternal chest pain and upper abdominal pain. CTA chest confirmed multi segment pulmonary emboli in the right lower lobe with pulmonary infarct/ischemia. No evidence of heart strain. Patient was started on heparin and transitioned to Eliquis.  ? ?2) 10/16/2020: Establish care with Dede Query PA-C. Recommended indefinite anticoagulation due to unprovoked PE.  ? ?3) 04/18/2021: Recommend to transition to maintenance dose of Eliquis 2.5 mg twice daily.  ? ?CHIEF COMPLAINTS/PURPOSE OF CONSULTATION:  ?Hx of unprovoked pulmonary emboli  ? ?HISTORY OF PRESENTING ILLNESS:  ?Shawn [name] 76 y.o. male returns for follow-up for history of unprovoked pulmonary emboli currently on indefinite anticoagulation with Eliquis. He is unaccompanied for this visit. ? ?At today's visit, Shawn [name] shares that since starting Eliquis back in June 2022, he noticed intermittent episodes of lip swelling and rash. These episodes occur every 2-3 weeks and resolve within 24 hours. The rash is pruritic, quarter size and located on his forearms. He denies starting any other medication, creams or other known allergens. He has not used any creams or medications for the lip swelling or rash.  ?In addition, he had an episode of penile swelling without pain that occurred 2 weeks ago. Prior to the swelling, he was riding his bike. The swelling resolved within 24 hours without any pain or discomfort. He  denies any urinary symptoms.  ? ?Otherwise, patient reports that he is doing well. His energy and appetite are stable. He is active and exercises daily. He denies He denies nausea, vomiting or abdominal pain. He has regular bowel movements without any diarrhea or constipation.  Patient denies easy bruising or signs of active bleeding.  He denies any fevers, chills, shortness of breath, chest pain or cough.He has no other complaints. Rest of the 10 point ROS is below. ? ?MEDICAL HISTORY:  ?Past Medical History:  ?Diagnosis Date  ? Acute urinary retention 12/2018  ? ?BPH  ? Asymmetrical sensorineural hearing loss 2021  ? Likely from noise exposure.  [facility] [phone] ENT referral for this dx being relatively new.  ? Barrett's esophagus   ? Dr. Collene Mares  ? Cataract   ? Chronic renal insufficiency, stage 2 (mild) 2017  ? Stage II/III (GFR 60 ml/min)  ? GERD (gastroesophageal reflux disease)   ? History of adenomatous polyp of colon 09/2007  ? Polyps at 1st screening colonoscopy; 2014 no polyps.  Rpt 12/29/17: adenomatous polyp-->  Recall 5 yrs.  ? Hyperlipemia, mixed   ? Pt intol of welchol; fenofibrate helpful.  Fenofib inc to 145 and atorva '10mg'$  started 08/23/18-->great lipid panel 11/2018.  ? Hypertension   ? 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 with pulm infarct.  Unprovoked->hosp ref to hem/onc  ? Subclinical hypothyroidism   ? per old PCP records.  TPO ab's elevated mildly 12/2017, with TSH 6.6 and normal T4 and T3==>developing Hashimoto's.   ? Thrombocytopenia ([place])   ? Hx of, mild/stable with monitoring per old PCP records  ? ? ?SURGICAL HISTORY: ?Past Surgical History:  ?Procedure Laterality Date  ? APPENDECTOMY    ? CATARACT EXTRACTION Bilateral 04/13/2018  ?  05/14/2018  ? COLONOSCOPY  12/29/2017  ? polypectomy --path - adenoma,  Repeat in 5 years  ? COLONOSCOPY W/ POLYPECTOMY  2014  ? per Dr. Collene Mares, recall 02/2023  ? ESOPHAGOGASTRODUODENOSCOPY  2014; 2016; 11/16/16  ? 2018--Barrett's esoph reconfirmed  ?  TRANSTHORACIC ECHOCARDIOGRAM  09/19/2020  ? (in the setting of acute PE)->EF 70-75%, RV fxn mildly reduced, hyperdynamic LV fxn, normal PA pressure, no valve probs.  ? UPPER GI ENDOSCOPY  03/03/2019  ? 2020.  03/03/21->small patch Barretts, multiple sessile gastric polyps, normal duodenum. Rpt 3 yrs.  ? ? ?SOCIAL HISTORY: ?Social History  ? ?Socioeconomic History  ? Marital status: Married  ?  Spouse name: Not on file  ? Number of children: Not on file  ? Years of education: Not on file  ? Highest education level: Master's degree (e.g., MA, MS, MEng, MEd, MSW, MBA)  ?Occupational History  ? Not on file  ?Tobacco Use  ? Smoking status: Never  ? Smokeless tobacco: Never  ?Vaping Use  ? Vaping Use: Never used  ?Substance and Sexual Activity  ? Alcohol use: No  ? Drug use: No  ? Sexual activity: Not on file  ?Other Topics Concern  ? Not on file  ?Social History Narrative  ? Married, grand-daughter lives with him.  ? 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  ? Orig from [place] and [place].  ? No T/A/Ds.  ? Exercises 3 X/week--walking 2-3 miles.  ? ?Social Determinants of Health  ? ?Financial Resource Strain: Low Risk   ? Difficulty of Paying Living Expenses: Not hard at all  ?Food Insecurity: No Food Insecurity  ? Worried About Charity fundraiser in the Last Year: Never true  ? Ran Out of Food in the Last Year: Never true  ?Transportation Needs: No Transportation Needs  ? Lack of Transportation (Medical): No  ? Lack of Transportation (Non-Medical): No  ?Physical Activity: Sufficiently Active  ? Days of Exercise per Week: 5 days  ? Minutes of Exercise per Session: 30 min  ?Stress: No Stress Concern Present  ? Feeling of Stress : Only a little  ?Social Connections: Socially Integrated  ? Frequency of Communication with Friends and Family: More than three times a week  ? Frequency of Social Gatherings with Friends and Family: Once a week  ? Attends Religious Services: More than 4 times per  year  ? Active Member of Clubs or Organizations: Yes  ? Attends Archivist Meetings: More than 4 times per year  ? Marital Status: Married  ?Intimate Partner Violence: Not At Risk  ? Fear of Current or Ex-Partner: No  ? Emotionally Abused: No  ? Physically Abused: No  ? Sexually Abused: No  ? ? ?FAMILY HISTORY: ?Family History  ?Problem Relation Age of Onset  ? Hypertension Mother   ? Hyperlipidemia Mother   ? Arthritis Father   ? Diabetes Paternal Grandmother 8  ? ? ?ALLERGIES:  has No Known Allergies. ? ?MEDICATIONS:  ?Current Outpatient Medications  ?Medication Sig Dispense Refill  ? apixaban (ELIQUIS) 2.5 MG TABS tablet Take 1 tablet (2.5 mg total) by mouth 2 (two) times daily. 180 tablet 3  ? atorvastatin (LIPITOR) 10 MG tablet Take 1 tablet (10 mg total) by mouth daily. 90 tablet 3  ? EPINEPHrine 0.3 mg/0.3 mL IJ SOAJ injection Inject 0.3 mg into the muscle as needed for anaphylaxis. 2 each 1  ? fenofibrate (TRICOR) 145 MG tablet Take 1 tablet (145 mg total) by  mouth daily. 90 tablet 3  ? fosinopril (MONOPRIL) 20 MG tablet TAKE 1 TABLET EVERY MORNING 90 tablet 1  ? hydrochlorothiazide (HYDRODIURIL) 25 MG tablet Take 0.5 tablets (12.5 mg total) by mouth daily. 45 tablet 3  ? omeprazole (PRILOSEC) 40 MG capsule Take 40 mg by mouth daily.    ? Polyethyl Glycol-Propyl Glycol (SYSTANE OP) Place 1 drop into both eyes as needed (dry eyes).    ? ?No current facility-administered medications for this visit.  ? ? ?REVIEW OF SYSTEMS:   ?Constitutional: ( - ) fevers, ( - )  chills , ( - ) night sweats ?Eyes: ( - ) blurriness of vision, ( - ) double vision, ( - ) watery eyes ?Ears, nose, mouth, throat, and face: ( - ) mucositis, ( - ) sore throat ?Respiratory: ( - ) cough, ( - ) dyspnea, ( - ) wheezes ?Cardiovascular: ( - ) palpitation, ( - ) chest discomfort, ( - ) lower extremity swelling ?Gastrointestinal:  ( - ) nausea, ( - ) heartburn, ( - ) change in bowel habits ?Skin: ( + ) abnormal skin  rashes ?Lymphatics: ( - ) new lymphadenopathy, ( - ) easy bruising ?Neurological: ( - ) numbness, ( - ) tingling, ( - ) new weaknesses ?Behavioral/Psych: ( - ) mood change, ( - ) new changes  ?All other systems were reviewed w

## 2021-07-09 ENCOUNTER — Telehealth: Payer: Self-pay | Admitting: Hematology and Oncology

## 2021-07-09 NOTE — Telephone Encounter (Signed)
Scheduled per 3/28 los, pt has been called and confirmed appt ?

## 2021-08-20 DIAGNOSIS — L570 Actinic keratosis: Secondary | ICD-10-CM | POA: Diagnosis not present

## 2021-08-20 DIAGNOSIS — L578 Other skin changes due to chronic exposure to nonionizing radiation: Secondary | ICD-10-CM | POA: Diagnosis not present

## 2021-08-26 IMAGING — CR DG ABDOMEN ACUTE W/ 1V CHEST
4 series · 4 of 4 positions shown · non-contrast
Comparison: Chest x-ray 08/27/2012

CLINICAL DATA: Lower abdominal pain

EXAM:
DG ABDOMEN ACUTE W/ 1V CHEST

[w chest pa]
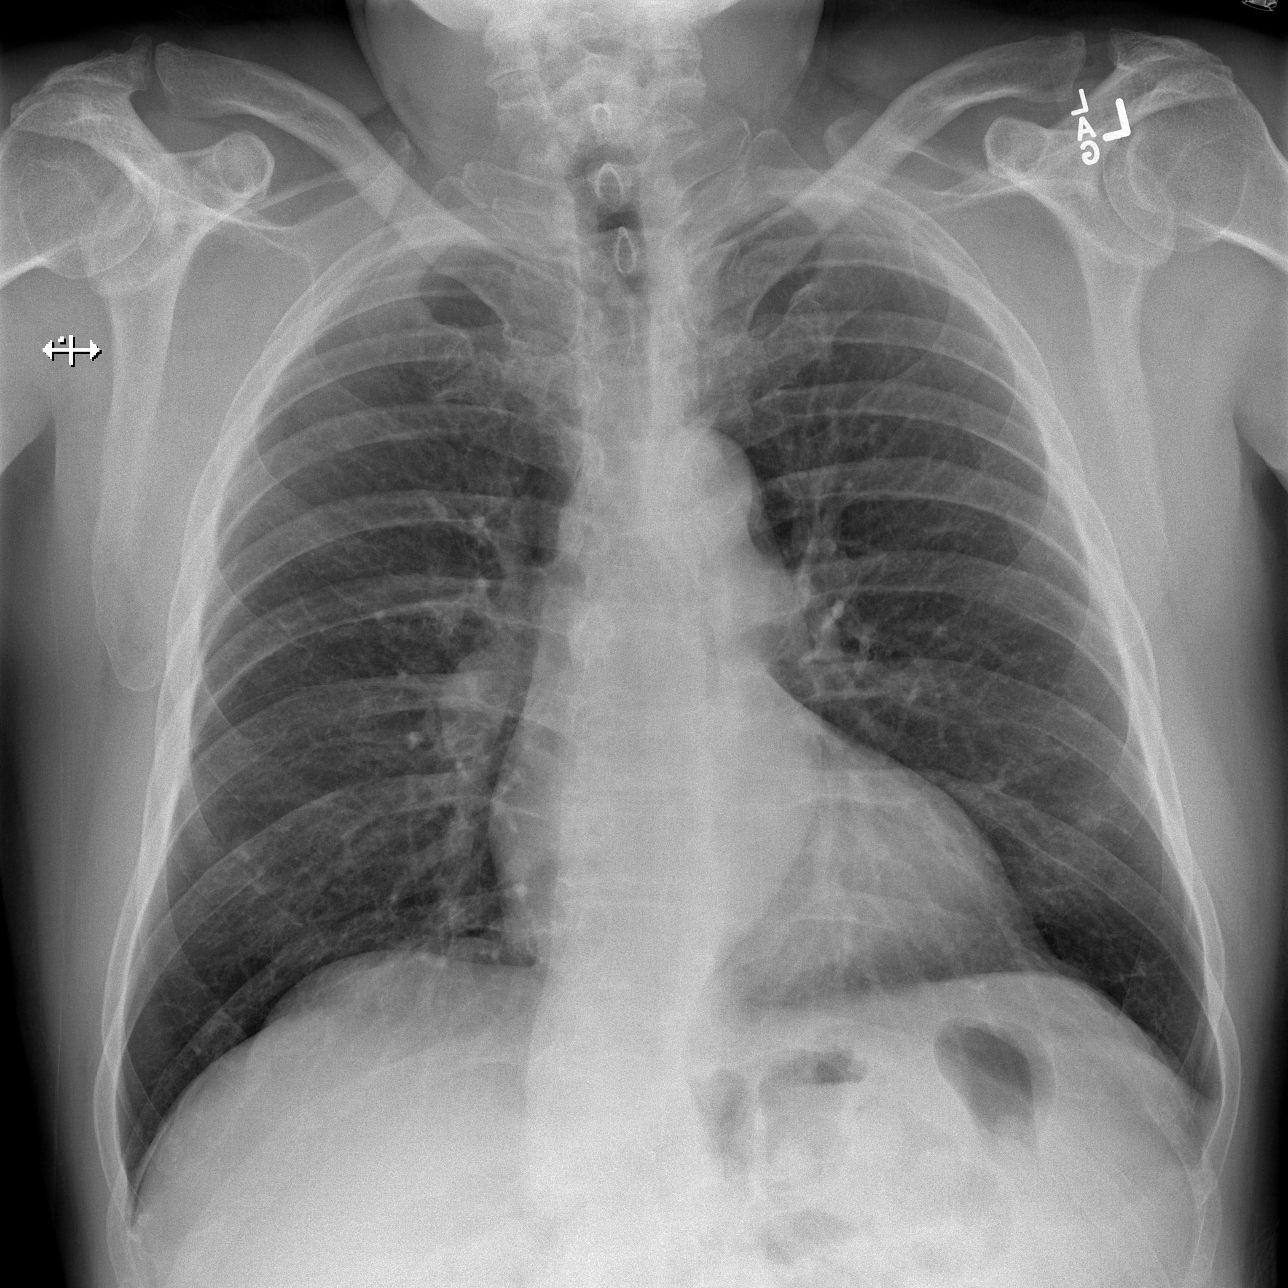

[w abdomen upright]
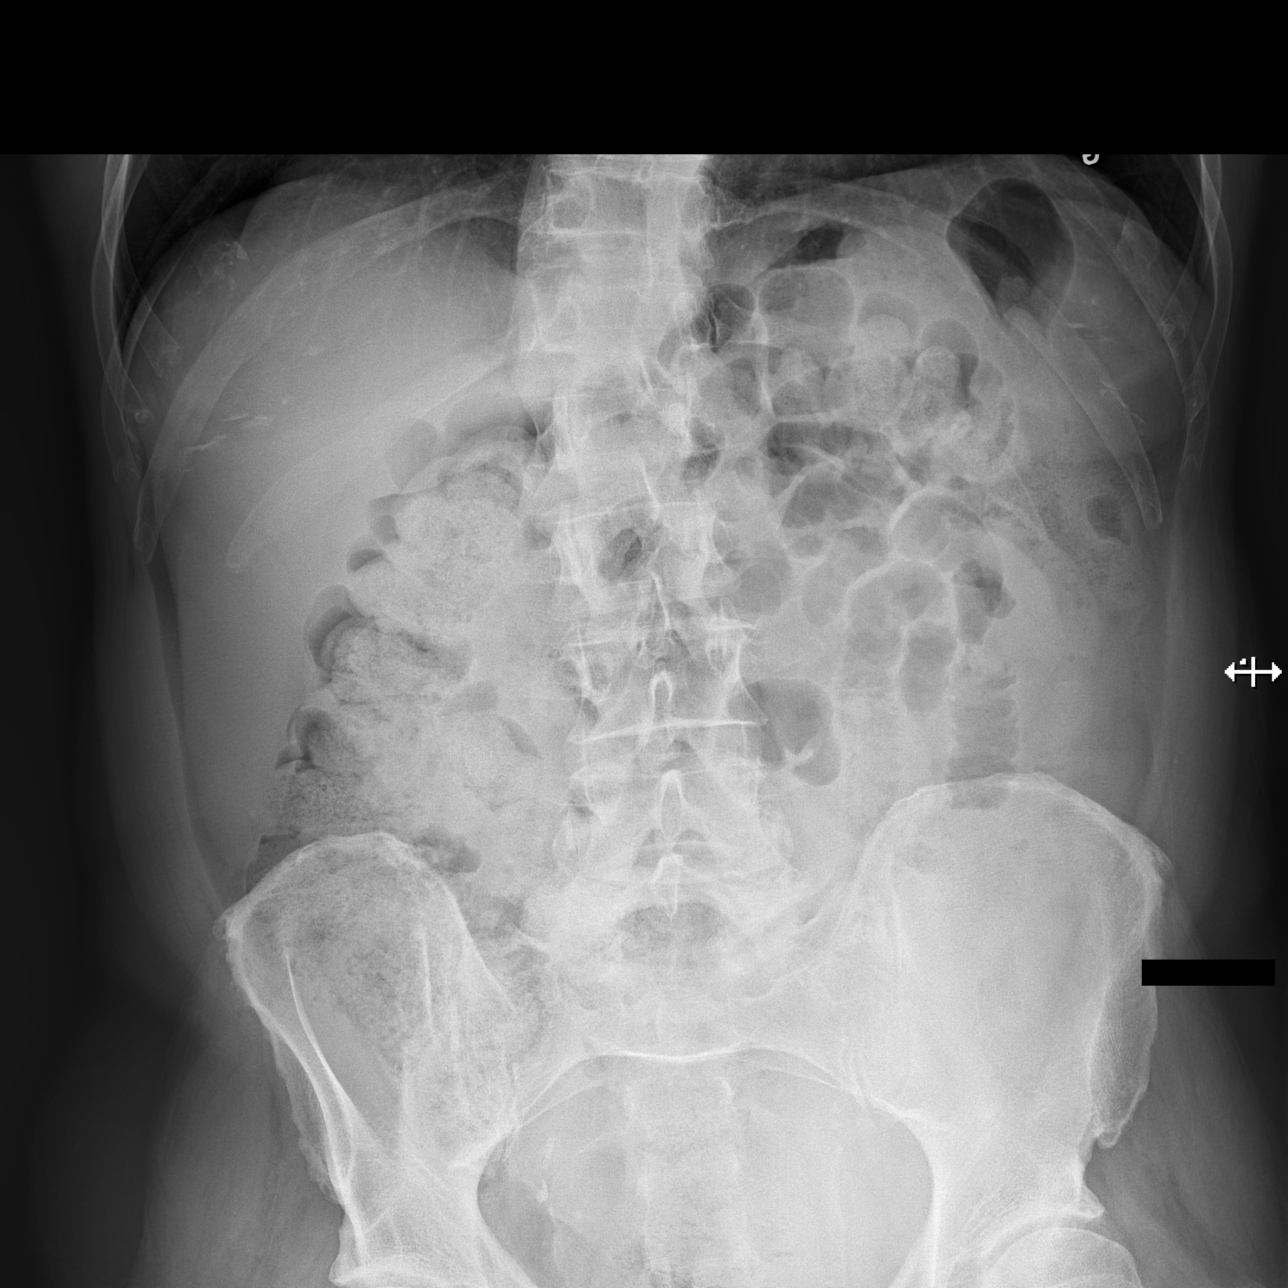

[t abdomen supine (1 of 2)]
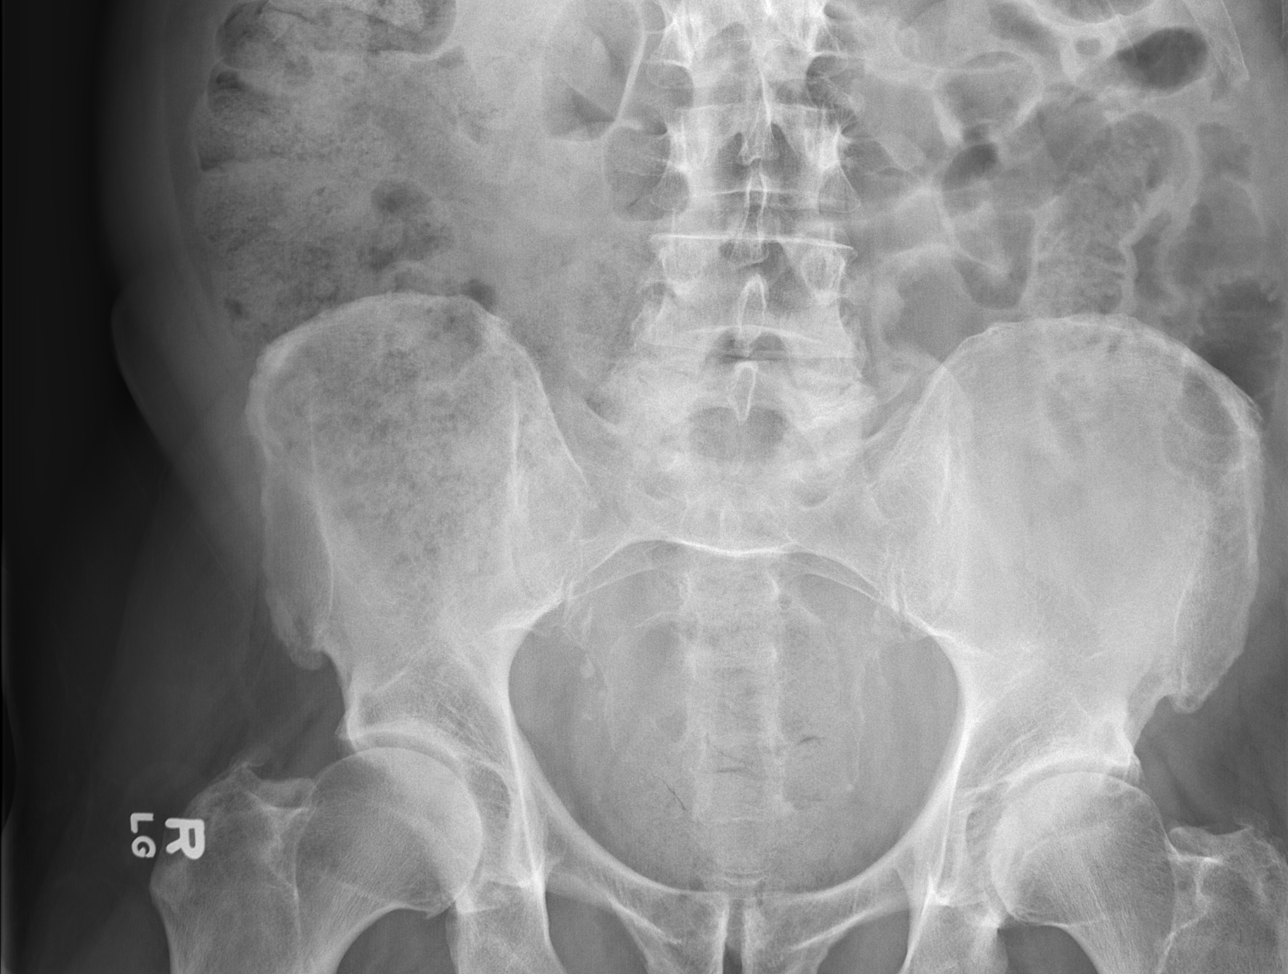

[t abdomen supine (2 of 2)]
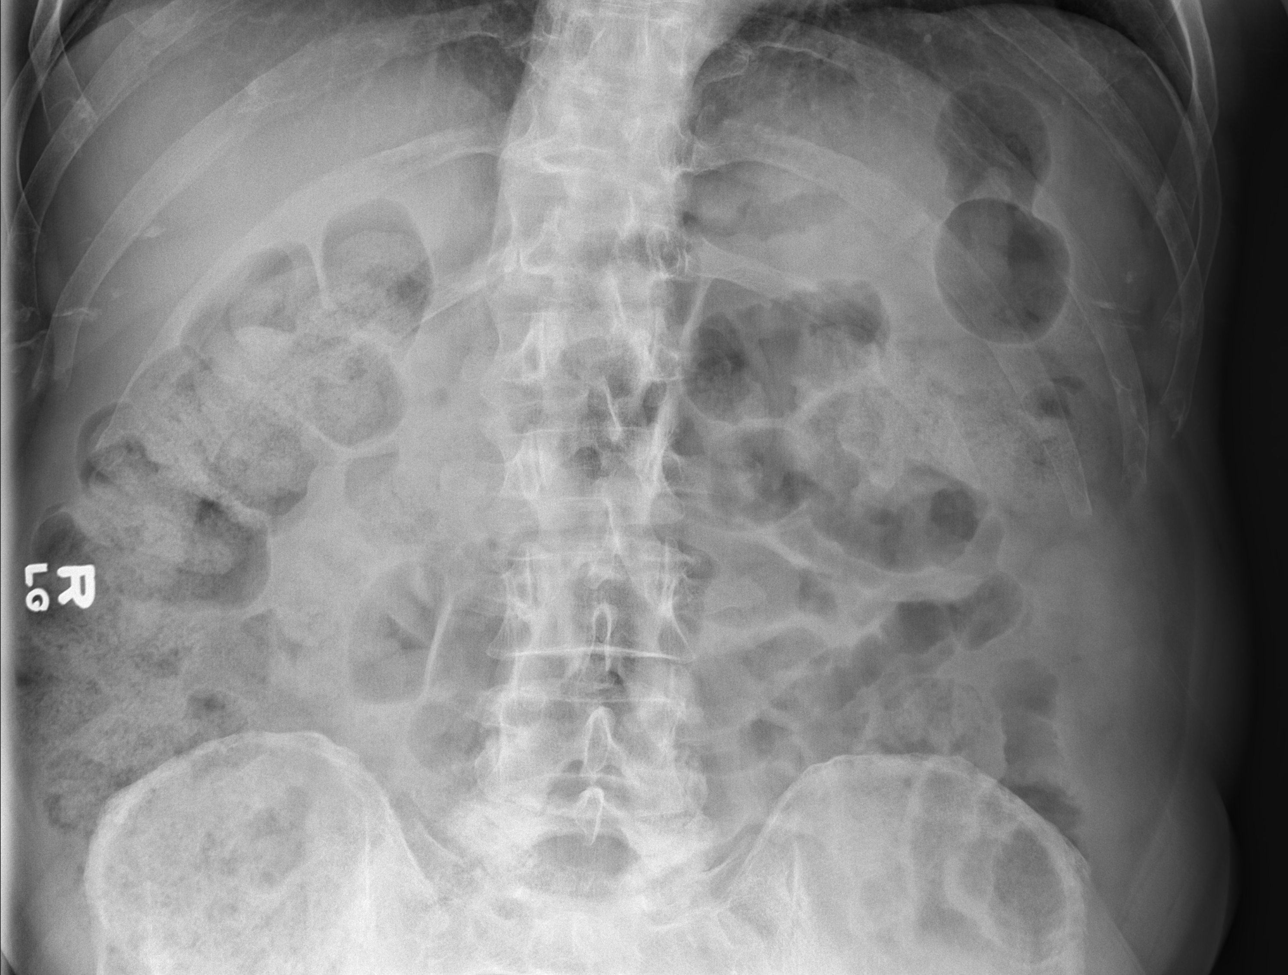

[4 of 4 positions shown; findings below may reference images not displayed]

FINDINGS: Moderate stool burden throughout the colon. The bowel gas pattern is
normal. There is no evidence of free intraperitoneal air. No
suspicious radio-opaque calculi or other significant radiographic
abnormality is seen. Heart size and mediastinal contours are within
normal limits. Both lungs are clear.
IMPRESSION: Moderate stool burden.  No acute findings.

## 2021-09-02 ENCOUNTER — Other Ambulatory Visit: Payer: Self-pay | Admitting: Family Medicine

## 2021-09-02 NOTE — Telephone Encounter (Signed)
Pt has scheduled appt on 5/26

## 2021-09-05 ENCOUNTER — Ambulatory Visit (INDEPENDENT_AMBULATORY_CARE_PROVIDER_SITE_OTHER): Payer: Medicare Other | Admitting: Family Medicine

## 2021-09-05 ENCOUNTER — Encounter: Payer: Self-pay | Admitting: Family Medicine

## 2021-09-05 VITALS — BP 124/73 | HR 59 | Temp 97.8°F | Ht 70.0 in | Wt 195.2 lb

## 2021-09-05 DIAGNOSIS — K4090 Unilateral inguinal hernia, without obstruction or gangrene, not specified as recurrent: Secondary | ICD-10-CM | POA: Diagnosis not present

## 2021-09-05 NOTE — Progress Notes (Signed)
OFFICE VISIT  09/05/2021  CC:  Chief Complaint  Patient presents with   Possible groin hernia    Lump in area that would like evaluated; 2.5- 3 weeks ago. No pain or change since then   Patient is a 76 y.o. male who presents for "possible groin hernia".  HPI: Noted about 2 weeks ago swelling in the right groin area. It is not painful and he feels no pressure or other discomfort. When he lies back he notes that it goes away. In the last several months he has been doing quite a bit of heavier manual labor in his yard and actually lifted a couple of 80 pound sacks of cement mix.   Past Medical History:  Diagnosis Date   Acute urinary retention 12/2018   ?BPH   Asymmetrical sensorineural hearing loss 2021   Likely from noise exposure.  [facility] [phone] ENT referral for this dx being relatively new.   Barrett's esophagus    Dr. Collene Mares   Cataract    Chronic renal insufficiency, stage 2 (mild) 2017   Stage II/III (GFR 60 ml/min)   GERD (gastroesophageal reflux disease)    History of adenomatous polyp of colon 09/2007   Polyps at 1st screening colonoscopy; 2014 no polyps.  Rpt 12/29/17: adenomatous polyp-->  Recall 5 yrs.   Hyperlipemia, mixed    Pt intol of welchol; fenofibrate helpful.  Fenofib inc to 145 and atorva '10mg'$  started 08/23/18-->great lipid panel 11/2018.   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  TPO ab's elevated mildly 12/2017, with TSH 6.6 and normal T4 and T3==>developing Hashimoto's.    Thrombocytopenia ([place])    Hx of, mild/stable with monitoring per old PCP records    Past Surgical History:  Procedure Laterality Date   APPENDECTOMY     CATARACT EXTRACTION Bilateral 04/13/2018   05/14/2018   COLONOSCOPY  12/29/2017   polypectomy --path - adenoma,  Repeat in 5 years   COLONOSCOPY W/ POLYPECTOMY  2014   per Dr. Collene Mares, recall 02/2023    ESOPHAGOGASTRODUODENOSCOPY  2014; 2016; 11/16/16   2018--Barrett's esoph reconfirmed   TRANSTHORACIC ECHOCARDIOGRAM  09/19/2020   (in the setting of acute PE)->EF 70-75%, RV fxn mildly reduced, hyperdynamic LV fxn, normal PA pressure, no valve probs.   UPPER GI ENDOSCOPY  03/03/2019   2020.  03/03/21->small patch Barretts, multiple sessile gastric polyps, normal duodenum. Rpt 3 yrs.    Outpatient Medications Prior to Visit  Medication Sig Dispense Refill   apixaban (ELIQUIS) 2.5 MG TABS tablet Take 1 tablet (2.5 mg total) by mouth 2 (two) times daily. 180 tablet 3   atorvastatin (LIPITOR) 10 MG tablet Take 1 tablet (10 mg total) by mouth daily. 90 tablet 3   fenofibrate (TRICOR) 145 MG tablet Take 1 tablet (145 mg total) by mouth daily. 90 tablet 3   fosinopril (MONOPRIL) 20 MG tablet TAKE 1 TABLET EVERY MORNING 90 tablet 1   hydrochlorothiazide (HYDRODIURIL) 25 MG tablet Take 0.5 tablets (12.5 mg total) by mouth daily. 45 tablet 3   omeprazole (PRILOSEC) 40 MG capsule Take 40 mg by mouth daily.     Polyethyl Glycol-Propyl Glycol (SYSTANE OP) Place 1 drop into both eyes as needed (dry eyes).     EPINEPHrine 0.3 mg/0.3 mL IJ SOAJ injection Inject 0.3 mg into the muscle as needed for anaphylaxis. (Patient not taking: Reported on 09/05/2021) 2 each  1   No facility-administered medications prior to visit.    No Known Allergies  ROS As per HPI  PE:    09/05/2021   10:25 AM 07/08/2021    9:47 AM 06/27/2021   10:47 AM  Vitals with BMI  Height '5\' 10"'$   '5\' 10"'$   Weight 195 lbs 3 oz 198 lbs 2 oz 196 lbs  BMI 28.01 16.10 96.04  Systolic 540 981 191  Diastolic 73 60 70  Pulse 59 58 59     Physical Exam  Gen: Alert, well appearing.  Patient is oriented to person, place, time, and situation. AFFECT: pleasant, lucid thought and speech. Groin: When standing he has a mild bulge in the right inguinal region.  This is soft to palpation, small bits of lobular soft tissue/fat palpable within it.   Nontender.  No scrotal mass. When supine the swelling completely resolves.    LABS:  Last CBC Lab Results  Component Value Date   WBC 4.6 04/18/2021   HGB 13.1 04/18/2021   HCT 39.6 04/18/2021   MCV 81.8 04/18/2021   MCH 27.1 04/18/2021   RDW 16.0 (H) 04/18/2021   PLT 197 47/82/9562   Last metabolic panel Lab Results  Component Value Date   GLUCOSE 91 04/18/2021   NA 135 04/18/2021   K 3.8 04/18/2021   CL 103 04/18/2021   CO2 24 04/18/2021   BUN 22 04/18/2021   CREATININE 1.06 04/18/2021   GFRNONAA >60 04/18/2021   CALCIUM 9.7 04/18/2021   PROT 7.3 04/18/2021   ALBUMIN 4.3 04/18/2021   BILITOT 0.6 04/18/2021   ALKPHOS 46 04/18/2021   AST 29 04/18/2021   ALT 17 04/18/2021   ANIONGAP 8 04/18/2021   IMPRESSION AND PLAN:  Right inguinal hernia, reducible and asymptomatic. Reassured.  Obs. Signs/symptoms to call or return for were reviewed and pt expressed understanding.  An After Visit Summary was printed and given to the patient.  FOLLOW UP: Return if symptoms worsen or fail to improve.  Signed:  Crissie Sickles, MD           09/05/2021

## 2021-09-16 ENCOUNTER — Telehealth: Payer: Self-pay | Admitting: Hematology and Oncology

## 2021-09-16 NOTE — Telephone Encounter (Signed)
Per 6/6 reschedule called and left message for pt about reschedule   details and call back number were left

## 2021-09-26 ENCOUNTER — Other Ambulatory Visit: Payer: Self-pay | Admitting: Family Medicine

## 2021-10-17 ENCOUNTER — Other Ambulatory Visit

## 2021-10-17 ENCOUNTER — Ambulatory Visit: Admitting: Physician Assistant

## 2021-10-21 ENCOUNTER — Ambulatory Visit (INDEPENDENT_AMBULATORY_CARE_PROVIDER_SITE_OTHER): Payer: Medicare Other | Admitting: Family Medicine

## 2021-10-21 ENCOUNTER — Encounter: Payer: Self-pay | Admitting: Family Medicine

## 2021-10-21 VITALS — BP 108/67 | HR 66 | Temp 97.5°F | Ht 70.0 in | Wt 201.2 lb

## 2021-10-21 DIAGNOSIS — J180 Bronchopneumonia, unspecified organism: Secondary | ICD-10-CM | POA: Diagnosis not present

## 2021-10-21 DIAGNOSIS — J069 Acute upper respiratory infection, unspecified: Secondary | ICD-10-CM | POA: Diagnosis not present

## 2021-10-21 MED ORDER — PREDNISONE 20 MG PO TABS: ORAL_TABLET | ORAL | 0 refills | Status: DC

## 2021-10-21 MED ORDER — DOXYCYCLINE HYCLATE 100 MG PO CAPS: 100.0000 mg | ORAL_CAPSULE | Freq: Two times a day (BID) | ORAL | 0 refills | Status: AC

## 2021-10-21 NOTE — Progress Notes (Signed)
OFFICE VISIT  10/21/2021  CC:  Chief Complaint  Patient presents with   Chest congestion    X1 week, Neg covid test yesterday; productive cough with yellow phlegm. Hx of pulmonary embolism. Stinging eyes, sneezing, loose bowels off/on, general fatigue, minor sore throat (unsure if due to coughing). Has taken Mucinex DM, started 1 tab bid, last night upped to 2 tabs. Wakes up around 1:30-2am, gets coffee for soothing of throat. Gets around 4-5 hours of sleep for the past week   Patient is a 76 y.o. male who presents for cough.  HPI: Patient had onset about 7 to 8 days ago of nasal congestion, runny nose and postnasal drip, mild sore throat, fatigue, and cough.  This progressed over the next day or 2 to very significant cough, productive.  He has had no fever.  No nausea or vomiting.  No chest pain or shortness of breath or wheezing. Mucinex DM over-the-counter slightly helpful for short periods. His cough is not improving at all.  ROS as above, plus-->  no dizziness, no HAs, no rashes, no melena/hematochezia.  No polyuria or polydipsia.  No myalgias or arthralgias.  No focal weakness, paresthesias, or tremors.  No acute vision or hearing abnormalities.  No dysuria or unusual/new urinary urgency or frequency.  No recent changes in lower legs. No n/v/d or abd pain.  No palpitations.    Past Medical History:  Diagnosis Date   Acute urinary retention 12/2018   ?BPH   Asymmetrical sensorineural hearing loss 2021   Likely from noise exposure.  [facility] [phone] ENT referral for this dx being relatively new.   Barrett's esophagus    Dr. Collene Mares   Cataract    Chronic renal insufficiency, stage 2 (mild) 2017   Stage II/III (GFR 60 ml/min)   GERD (gastroesophageal reflux disease)    History of adenomatous polyp of colon 09/2007   Polyps at 1st screening colonoscopy; 2014 no polyps.  Rpt 12/29/17: adenomatous polyp-->  Recall 5 yrs.   Hyperlipemia, mixed    Pt intol of welchol;  fenofibrate helpful.  Fenofib inc to 145 and atorva '10mg'$  started 08/23/18-->great lipid panel 11/2018.   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  Unprovoked->hosp ref to hem/onc   Subclinical hypothyroidism    per old PCP records.  TPO ab's elevated mildly 12/2017, with TSH 6.6 and normal T4 and T3==>developing Hashimoto's.    Thrombocytopenia ([place])    Hx of, mild/stable with monitoring per old PCP records    Past Surgical History:  Procedure Laterality Date   APPENDECTOMY     CATARACT EXTRACTION Bilateral 04/13/2018   05/14/2018   COLONOSCOPY  12/29/2017   polypectomy --path - adenoma,  Repeat in 5 years   COLONOSCOPY W/ POLYPECTOMY  2014   per Dr. Collene Mares, recall 02/2023   ESOPHAGOGASTRODUODENOSCOPY  2014; 2016; 11/16/16   2018--Barrett's esoph reconfirmed   TRANSTHORACIC ECHOCARDIOGRAM  09/19/2020   (in the setting of acute PE)->EF 70-75%, RV fxn mildly reduced, hyperdynamic LV fxn, normal PA pressure, no valve probs.   UPPER GI ENDOSCOPY  03/03/2019   2020.  03/03/21->small patch Barretts, multiple sessile gastric polyps, normal duodenum. Rpt 3 yrs.    Outpatient Medications Prior to Visit  Medication Sig Dispense Refill   apixaban (ELIQUIS) 2.5 MG TABS tablet Take 1 tablet (2.5 mg total) by mouth 2 (two) times daily. 180 tablet 3   atorvastatin (LIPITOR) 10 MG tablet TAKE 1 TABLET DAILY 90 tablet 0  fenofibrate (TRICOR) 145 MG tablet TAKE 1 TABLET DAILY 90 tablet 0   fosinopril (MONOPRIL) 20 MG tablet TAKE 1 TABLET EVERY MORNING 90 tablet 1   hydrochlorothiazide (HYDRODIURIL) 25 MG tablet Take 0.5 tablets (12.5 mg total) by mouth daily. 45 tablet 3   omeprazole (PRILOSEC) 40 MG capsule Take 40 mg by mouth daily.     Polyethyl Glycol-Propyl Glycol (SYSTANE OP) Place 1 drop into both eyes as needed (dry eyes).     EPINEPHrine 0.3 mg/0.3 mL IJ SOAJ injection Inject 0.3 mg into the muscle as needed for anaphylaxis. (Patient not taking: Reported on  09/05/2021) 2 each 1   No facility-administered medications prior to visit.    No Known Allergies  ROS As per HPI  PE:    10/21/2021   10:56 AM 09/05/2021   10:25 AM 07/08/2021    9:47 AM  Vitals with BMI  Height '5\' 10"'$  '5\' 10"'$    Weight 201 lbs 3 oz 195 lbs 3 oz 198 lbs 2 oz  BMI 28.87 30.86 57.84  Systolic 696 295 284  Diastolic 67 73 60  Pulse 66 59 58  02 sat 96% RA today  Physical Exam  General: Alert, tired appearing but in no distress.  Nontoxic. VS: noted--normal. Gen: alert, NAD, NONTOXIC APPEARING. HEENT: eyes without injection, drainage, or swelling.  Ears: Full of cerumen bilaterally, unable to view TMs.  Nose: Scant clear rhinorrhea, with some dried, crusty exudate adherent to mildly injected mucosa.  No purulent d/c.  No paranasal sinus TTP.  No facial swelling.  Throat and mouth without focal lesion.  No pharyngial swelling, erythema, or exudate.   Neck: supple, no LAD.   LUNGS: Inspiratory crackles in right base, otherwise CTA bilat, nonlabored resps.  Aeration is normal. CV: RRR, no m/r/g. EXT: no c/c/e SKIN: no rash   LABS:  None today  Lab Results  Component Value Date   WBC 4.6 04/18/2021   HGB 13.1 04/18/2021   HCT 39.6 04/18/2021   MCV 81.8 04/18/2021   PLT 197 04/18/2021     Chemistry      Component Value Date/Time   NA 135 04/18/2021 1018   K 3.8 04/18/2021 1018   CL 103 04/18/2021 1018   CO2 24 04/18/2021 1018   BUN 22 04/18/2021 1018   CREATININE 1.06 04/18/2021 1018   CREATININE 1.16 02/10/2019 1415      Component Value Date/Time   CALCIUM 9.7 04/18/2021 1018   ALKPHOS 46 04/18/2021 1018   AST 29 04/18/2021 1018   ALT 17 04/18/2021 1018   BILITOT 0.6 04/18/2021 1018     IMPRESSION AND PLAN:  #1 acute bronchopneumonia. We will treat with doxycycline 100 mg twice daily x10 days. Additionally, prednisone 40 mg a day x5 days and then 20 mg a day x5 days. He wants to continue Mucinex DM over-the-counter for his  cough.  Signs/symptoms to call or return for were reviewed and pt expressed understanding.  An After Visit Summary was printed and given to the patient.  FOLLOW UP: Return if symptoms worsen or fail to improve in 3d.  Signed:  Crissie Sickles, MD           10/21/2021

## 2021-11-17 ENCOUNTER — Other Ambulatory Visit: Payer: Self-pay | Admitting: Family Medicine

## 2021-12-04 ENCOUNTER — Telehealth: Payer: Self-pay

## 2021-12-04 ENCOUNTER — Encounter: Payer: Self-pay | Admitting: Family Medicine

## 2021-12-04 ENCOUNTER — Ambulatory Visit (INDEPENDENT_AMBULATORY_CARE_PROVIDER_SITE_OTHER): Payer: Medicare Other | Admitting: Family Medicine

## 2021-12-04 VITALS — BP 110/70 | HR 57 | Temp 97.4°F | Ht 70.0 in | Wt 198.2 lb

## 2021-12-04 DIAGNOSIS — Z86711 Personal history of pulmonary embolism: Secondary | ICD-10-CM | POA: Diagnosis not present

## 2021-12-04 DIAGNOSIS — M7632 Iliotibial band syndrome, left leg: Secondary | ICD-10-CM | POA: Diagnosis not present

## 2021-12-04 DIAGNOSIS — E7800 Pure hypercholesterolemia, unspecified: Secondary | ICD-10-CM

## 2021-12-04 DIAGNOSIS — I10 Essential (primary) hypertension: Secondary | ICD-10-CM

## 2021-12-04 DIAGNOSIS — E038 Other specified hypothyroidism: Secondary | ICD-10-CM

## 2021-12-04 DIAGNOSIS — M25552 Pain in left hip: Secondary | ICD-10-CM | POA: Diagnosis not present

## 2021-12-04 DIAGNOSIS — M7062 Trochanteric bursitis, left hip: Secondary | ICD-10-CM | POA: Diagnosis not present

## 2021-12-04 LAB — COMPREHENSIVE METABOLIC PANEL
ALT: 15 U/L (ref 0–53)
AST: 23 U/L (ref 0–37)
Albumin: 4.4 g/dL (ref 3.5–5.2)
Alkaline Phosphatase: 46 U/L (ref 39–117)
BUN: 19 mg/dL (ref 6–23)
CO2: 24 mEq/L (ref 19–32)
Calcium: 9.5 mg/dL (ref 8.4–10.5)
Chloride: 103 mEq/L (ref 96–112)
Creatinine, Ser: 0.97 mg/dL (ref 0.40–1.50)
GFR: 76.86 mL/min (ref >60.00)
Glucose, Bld: 88 mg/dL (ref 70–99)
Potassium: 3.8 mEq/L (ref 3.5–5.1)
Sodium: 138 mEq/L (ref 135–145)
Total Bilirubin: 0.6 mg/dL (ref 0.2–1.2)
Total Protein: 7.1 g/dL (ref 6.0–8.3)

## 2021-12-04 LAB — LIPID PANEL
Cholesterol: 144 mg/dL (ref 0–200)
HDL: 42 mg/dL (ref >39.00)
LDL Cholesterol: 87 mg/dL (ref 0–99)
NonHDL: 102.04
Total CHOL/HDL Ratio: 3
Triglycerides: 76 mg/dL (ref 0.0–149.0)
VLDL: 15.2 mg/dL (ref 0.0–40.0)

## 2021-12-04 LAB — T4, FREE: Free T4: 0.97 ng/dL (ref 0.60–1.60)

## 2021-12-04 LAB — TSH: TSH: 5.75 u[IU]/mL — ABNORMAL HIGH (ref 0.35–5.50)

## 2021-12-04 MED ORDER — ATORVASTATIN CALCIUM 10 MG PO TABS: 10.0000 mg | ORAL_TABLET | Freq: Every day | ORAL | 3 refills | Status: DC

## 2021-12-04 MED ORDER — HYDROCHLOROTHIAZIDE 25 MG PO TABS
12.5000 mg | ORAL_TABLET | Freq: Every day | ORAL | 3 refills | Status: DC
Start: 2021-12-04 — End: 2022-11-30

## 2021-12-04 MED ORDER — FENOFIBRATE 145 MG PO TABS: 145.0000 mg | ORAL_TABLET | Freq: Every day | ORAL | 3 refills | Status: DC

## 2021-12-04 NOTE — Progress Notes (Signed)
OFFICE VISIT  12/04/2021  CC:  Chief Complaint  Patient presents with   Hyperlipidemia    Pt is fasting   Hypertension   Patient is a 76 y.o. male who presents for 9-monthfollow-up hypertension, hyperlipidemia, history of pulmonary embolism, and chronic renal insufficiency stage II/III.   INTERIM HX:  Pressures normal at home.  No problem with medications.  He describes about 2 years of pain in the lateral aspect of the left hip extending on the left thigh and just past the left knee. The pain is becoming more of a problem in the last 6 months or so.  No anterior hip pain.  He does hear some popping occasionally when he bends his knee.  No paresthesias or leg weakness.  No back pain.  No preceding injury or strain. He does walk with a cane. No significant change in his walking/physical activity/workouts prior to onset of his symptoms.  ROS as above, plus--> no fevers, no CP, no SOB, no wheezing, no cough, no dizziness, no HAs, no rashes, no melena/hematochezia.  No polyuria or polydipsia.    No focal weakness, paresthesias, or tremors.  No acute vision or hearing abnormalities.  No dysuria or unusual/new urinary urgency or frequency.  No recent changes in lower legs. No n/v/d or abd pain.  No palpitations.     Past Medical History:  Diagnosis Date   Acute urinary retention 12/2018   ?BPH   Asymmetrical sensorineural hearing loss 2021   Likely from noise exposure.  [facility] [phone] referral for this dx being relatively new.   Barrett's esophagus    Dr. MCollene Mares  Cataract    Chronic renal insufficiency, stage 2 (mild) 2017   Stage II/III (GFR 60 ml/min)   GERD (gastroesophageal reflux disease)    History of adenomatous polyp of colon 09/2007   Polyps at 1st screening colonoscopy; 2014 no polyps.  Rpt 12/29/17: adenomatous polyp-->  Recall 5 yrs.   Hyperlipemia, mixed    Pt intol of welchol; fenofibrate helpful.  Fenofib inc to 145 and atorva '10mg'$  started  08/23/18-->great lipid panel 11/2018.   Hypertension    [place]ulmonary embolism ([place] 09/2020   with pulm infarct.  Unprovoked->hosp ref to hem/onc   Subclinical hypothyroidism    per old PCP records.  TPO ab's elevated mildly 12/2017, with TSH 6.6 and normal T4 and T3==>developing Hashimoto's.    Thrombocytopenia ([place]    Hx of, mild/stable with monitoring per old PCP records    Past Surgical History:  Procedure Laterality Date   APPENDECTOMY     CATARACT EXTRACTION Bilateral 04/13/2018   05/14/2018   COLONOSCOPY  12/29/2017   polypectomy --path - adenoma,  Repeat in 5 years   COLONOSCOPY W/ POLYPECTOMY  2014   per Dr. MCollene Mares recall 02/2023   ESOPHAGOGASTRODUODENOSCOPY  2014; 2016; 11/16/16   2018--Barrett's esoph reconfirmed   TRANSTHORACIC ECHOCARDIOGRAM  09/19/2020   (in the setting of acute PE)->EF 70-75%, RV fxn mildly reduced, hyperdynamic LV fxn, normal PA pressure, no valve probs.   UPPER GI ENDOSCOPY  03/03/2019   2020.  03/03/21->small patch Barretts, multiple sessile gastric polyps, normal duodenum. Rpt 3 yrs.    Outpatient Medications Prior to Visit  Medication Sig Dispense Refill   apixaban (ELIQUIS) 2.5 MG TABS tablet Take 1 tablet (2.5 mg total) by mouth 2 (two) times daily. 180 tablet 3   fosinopril (MONOPRIL) 20 MG tablet TAKE 1 TABLET EVERY MORNING 90 tablet 0   omeprazole (PRILOSEC) 40 MG capsule Take  40 mg by mouth daily.     Polyethyl Glycol-Propyl Glycol (SYSTANE OP) Place 1 drop into both eyes as needed (dry eyes).     atorvastatin (LIPITOR) 10 MG tablet TAKE 1 TABLET DAILY 90 tablet 0   fenofibrate (TRICOR) 145 MG tablet TAKE 1 TABLET DAILY 90 tablet 0   hydrochlorothiazide (HYDRODIURIL) 25 MG tablet Take 0.5 tablets (12.5 mg total) by mouth daily. 45 tablet 3   EPINEPHrine 0.3 mg/0.3 mL IJ SOAJ injection Inject 0.3 mg into the muscle as needed for anaphylaxis. (Patient not taking: Reported on 09/05/2021) 2 each 1   fluorouracil (EFUDEX) 5 % cream Apply topically.      predniSONE (DELTASONE) 20 MG tablet 2 tabs po qd x 5d, then 1 tab po qd x 5d (Patient not taking: Reported on 12/04/2021) 15 tablet 0   No facility-administered medications prior to visit.    No Known Allergies  ROS As per HPI  PE:    12/04/2021    9:03 AM 10/21/2021   10:56 AM 09/05/2021   10:25 AM  Vitals with BMI  Height '5\' 10"'$  '5\' 10"'$  '5\' 10"'$   Weight 198 lbs 3 oz 201 lbs 3 oz 195 lbs 3 oz  BMI 28.44 62.22 97.98  Systolic 921 194 174  Diastolic 70 67 73  Pulse 57 66 59     Physical Exam  Gen: Alert, well appearing.  Patient is oriented to person, place, time, and situation.a AFFECT: pleasant, lucid thought and speech. Standing isolated on left leg he has a discomfort and pulling sensation in the lateral hip extending down the outer aspect of left thigh.  He feels some discomfort in the same area as well as around the lateral aspect of his knee with squat movement.  He has a bit of popping coming from the IT band region around the knee but none from around the lateral hip. Mild tenderness to palpation over the lateral hip/greater troches area.  Similar sensation extending down the lateral aspect of the left thigh, less so over the lateral aspect of the left knee.  He has no knee swelling or erythema.  His range of motion is full in the knee. His hips range of motion is significantly limited in external rotation and internal rotation.    LABS:  Last CBC Lab Results  Component Value Date   WBC 4.6 04/18/2021   HGB 13.1 04/18/2021   HCT 39.6 04/18/2021   MCV 81.8 04/18/2021   MCH 27.1 04/18/2021   RDW 16.0 (H) 04/18/2021   PLT 197 11/24/4816   Last metabolic panel Lab Results  Component Value Date   GLUCOSE 91 04/18/2021   NA 135 04/18/2021   K 3.8 04/18/2021   CL 103 04/18/2021   CO2 24 04/18/2021   BUN 22 04/18/2021   CREATININE 1.06 04/18/2021   GFRNONAA >60 04/18/2021   CALCIUM 9.7 04/18/2021   PROT 7.3 04/18/2021   ALBUMIN 4.3 04/18/2021   BILITOT 0.6  04/18/2021   ALKPHOS 46 04/18/2021   AST 29 04/18/2021   ALT 17 04/18/2021   ANIONGAP 8 04/18/2021   Last lipids Lab Results  Component Value Date   CHOL 136 06/06/2021   HDL 41.50 06/06/2021   LDLCALC 80 06/06/2021   TRIG 71.0 06/06/2021   CHOLHDL 3 06/06/2021    Last thyroid functions Lab Results  Component Value Date   TSH 5.22 06/06/2021   T3TOTAL 92 06/06/2021   Lab Results  Component Value Date   PSA 2.48 12/07/2019  PSA 1.84 08/19/2018   PSA 1.86 07/08/2017   IMPRESSION AND PLAN:  #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 He does have some symptoms referable to the greater troch region, which could be part of his IT band tightening and could be causing some bursitis around the gluteal tendons. I think he would benefit rightly from physical therapy--ordered today. No medications prescribed for this. Discussed possibility of ultrasound-guided steroid injection in the appropriate location(s) If not showing significant improvement in 6 weeks and as long as we do not think there is any alternative diagnosis complicating things.  #2 hypertension, well controlled on fosinopril 20 mg daily, 12.5 mg HCTZ every other day. Checking electrolytes and creatinine today.  #3 hypercholesterolemia.  Doing well on atorvastatin 10 mg a day. Lipid panel and hepatic panel today.  4.  Subclinical hypothyroidism. Monitoring thyroid panel today.  5. Preventative health care: Vaccines: He declines shingrix and Tdap.  Otherwise all vaccines UTD. Colon ca screening: needs repeat colonoscopy 2024. Prostate ca screening: he declines any further PSA screening.  An After Visit Summary was printed and given to the patient.  FOLLOW UP: Return in about 6 weeks (around 01/15/2022) for f/u left leg.  Signed:  Crissie Sickles, MD           12/04/2021

## 2021-12-04 NOTE — Telephone Encounter (Signed)
Spoke with pt to schedule AWV in office. Patient declined to schedule wellness visit at this time.   

## 2021-12-04 NOTE — Patient Instructions (Signed)
Iliotibial Band Syndrome  Iliotibial band syndrome is a condition that often causes knee pain. It can also cause pain in the outside of the hip, thigh, and knee. The iliotibial band is a strip of tissue in each of the legs. This band runs from the outside of the hip and down the thigh to the outside of the knee. Repeatedly bending and straightening your knee can irritate your iliotibial band. What are the causes? This condition is caused by inflammation from rubbing (friction) of the iliotibial band as it moves over the thigh bone (femur) when you bend and straighten your knee again and again. What increases the risk? You are more likely to develop this condition if: You change elevation often while running on a treadmill. You run very long distances. You recently increased the length or intensity of your workouts. Intensity means how much effort you put in. You run downhill often, or you just started running downhill. You ride a bike very far or often. You may also be at greater risk if: You start a new workout routine without first warming up your muscles. You have a job that requires you to bend, squat, or climb often. What are the signs or symptoms? Symptoms of this condition include: Pain along the outside of your knee that may be worse with activity, especially running or going up and down stairs. A feeling like a snap over your knee or hip. Swelling on the outside of your knee. Pain or a feeling of tightness in your hip. How is this diagnosed? This condition is diagnosed based on: Your symptoms. Your medical history. A physical exam. You may also see a health care provider who specializes in reducing pain and improving movement (physical therapist). A physical therapist may do an exam to check your balance, movement, and way of walking or running (gait) to see whether the way you move could add to your injury. You may also have tests to measure your strength, flexibility, and range  of motion. How is this treated? This condition may be treated by: Taking NSAIDs, such as ibuprofen, to help relieve pain and swelling. Resting and limiting exercise. Returning to activities gradually. Doing exercises to improve movement and strength (physical therapy) as told by your health care provider. Having an injection of steroid medicine. This is medicine that helps relieve inflammation. Having surgery. This may be done if your symptoms do not improve after other treatments. Follow these instructions at home: Managing pain, stiffness, and swelling If directed, put ice on the injured area. To do this: Put ice in a plastic bag. Place a towel between your skin and the bag. Leave the ice on for 20 minutes, 2-3 times a day. Remove the ice if your skin turns bright red. This is very important. If you cannot feel pain, heat, or cold, you have a greater risk of damage to the area.  Activity Return to your normal activities as told by your health care provider. Ask your health care provider what activities are safe for you. Include low-impact activities, such as swimming, in your exercise routine. Check with your health care provider to make sure running is safe for you. Do exercises as told by your health care provider. General instructions Take over-the-counter and prescription medicines only as told by your health care provider. Make sure you wear shoes that fit well and have good cushioning and arch support. Keep all follow-up visits. This is important. How is this prevented? Warm up and stretch before being active.  Cool down and stretch after being active. Give your body time to rest between periods of activity. Consider getting help from a coach or trainer to come up with a safe running plan and a plan to advance (progress) your training that fits your goals and ability. Change directions often while running around a track. Replace your shoes when the soles are worn out. Maintain  physical fitness. This includes strength and flexibility. Contact a health care provider if: Your pain does not improve. Your pain gets worse even with treatment. Summary Iliotibial band syndrome is a condition that often causes knee pain. It can also cause pain in the outside of your hip, thigh, and knee. Treatment includes taking NSAIDs, resting, returning to activities gradually, and doing physical therapy exercises. Return to your normal activities as told by your health care provider. Ask your health care provider what activities are safe for you. This information is not intended to replace advice given to you by your health care provider. Make sure you discuss any questions you have with your health care provider. Document Revised: 07/31/2019 Document Reviewed: 07/31/2019 Elsevier Patient Education  [place].

## 2021-12-05 LAB — T3: T3, Total: 103 ng/dL (ref 76–181)

## 2021-12-12 DIAGNOSIS — M7632 Iliotibial band syndrome, left leg: Secondary | ICD-10-CM | POA: Diagnosis not present

## 2021-12-12 DIAGNOSIS — M7062 Trochanteric bursitis, left hip: Secondary | ICD-10-CM | POA: Diagnosis not present

## 2021-12-15 ENCOUNTER — Encounter: Payer: Self-pay | Admitting: Family Medicine

## 2021-12-16 ENCOUNTER — Telehealth: Payer: Self-pay

## 2021-12-16 NOTE — Telephone Encounter (Signed)
Spoke with pt to schedule AWV in office. Patient declined to schedule wellness visit at this time.   

## 2021-12-17 DIAGNOSIS — M7632 Iliotibial band syndrome, left leg: Secondary | ICD-10-CM | POA: Diagnosis not present

## 2021-12-17 DIAGNOSIS — M7062 Trochanteric bursitis, left hip: Secondary | ICD-10-CM | POA: Diagnosis not present

## 2021-12-19 DIAGNOSIS — M7062 Trochanteric bursitis, left hip: Secondary | ICD-10-CM | POA: Diagnosis not present

## 2021-12-19 DIAGNOSIS — M7632 Iliotibial band syndrome, left leg: Secondary | ICD-10-CM | POA: Diagnosis not present

## 2021-12-24 DIAGNOSIS — M7062 Trochanteric bursitis, left hip: Secondary | ICD-10-CM | POA: Diagnosis not present

## 2021-12-24 DIAGNOSIS — M7632 Iliotibial band syndrome, left leg: Secondary | ICD-10-CM | POA: Diagnosis not present

## 2021-12-26 DIAGNOSIS — M7632 Iliotibial band syndrome, left leg: Secondary | ICD-10-CM | POA: Diagnosis not present

## 2021-12-26 DIAGNOSIS — M7062 Trochanteric bursitis, left hip: Secondary | ICD-10-CM | POA: Diagnosis not present

## 2021-12-31 ENCOUNTER — Encounter: Payer: Self-pay | Admitting: Family Medicine

## 2021-12-31 DIAGNOSIS — M7632 Iliotibial band syndrome, left leg: Secondary | ICD-10-CM | POA: Diagnosis not present

## 2021-12-31 DIAGNOSIS — M5442 Lumbago with sciatica, left side: Secondary | ICD-10-CM

## 2021-12-31 DIAGNOSIS — M25562 Pain in left knee: Secondary | ICD-10-CM

## 2021-12-31 DIAGNOSIS — M7062 Trochanteric bursitis, left hip: Secondary | ICD-10-CM | POA: Diagnosis not present

## 2021-12-31 DIAGNOSIS — M25552 Pain in left hip: Secondary | ICD-10-CM

## 2021-12-31 NOTE — Telephone Encounter (Signed)
Hi Lain,  I understand your concern. MRI of the back can be done.  However,I cannot even order it without having a plain x-ray of the back first. The best next step is to start with plain x-rays of the back, hip, and knee. We will wait for these results and then proceed with ordering the MRI of the back.  I have entered the orders for the x-rays to be done at [facility]-> this is a [place] facility at the intersection of [address] and [address]. You do not need an appointment to get these. We will contact you when we get results. -PM

## 2022-01-02 ENCOUNTER — Ambulatory Visit (HOSPITAL_BASED_OUTPATIENT_CLINIC_OR_DEPARTMENT_OTHER)
Admission: RE | Admit: 2022-01-02 | Discharge: 2022-01-02 | Disposition: A | Discharge status: Home / Self Care | Payer: Medicare Other | Source: Ambulatory Visit | Attending: Family Medicine | Admitting: Family Medicine

## 2022-01-02 DIAGNOSIS — M5442 Lumbago with sciatica, left side: Secondary | ICD-10-CM | POA: Diagnosis not present

## 2022-01-02 DIAGNOSIS — M25562 Pain in left knee: Secondary | ICD-10-CM | POA: Insufficient documentation

## 2022-01-02 DIAGNOSIS — M25552 Pain in left hip: Secondary | ICD-10-CM | POA: Insufficient documentation

## 2022-01-02 DIAGNOSIS — M7062 Trochanteric bursitis, left hip: Secondary | ICD-10-CM | POA: Diagnosis not present

## 2022-01-02 DIAGNOSIS — M7632 Iliotibial band syndrome, left leg: Secondary | ICD-10-CM | POA: Diagnosis not present

## 2022-01-05 ENCOUNTER — Inpatient Hospital Stay (HOSPITAL_BASED_OUTPATIENT_CLINIC_OR_DEPARTMENT_OTHER): Payer: Medicare Other | Admitting: Hematology and Oncology

## 2022-01-05 ENCOUNTER — Inpatient Hospital Stay: Payer: Medicare Other | Attending: Hematology and Oncology

## 2022-01-05 ENCOUNTER — Encounter: Payer: Self-pay | Admitting: Family Medicine

## 2022-01-05 ENCOUNTER — Other Ambulatory Visit: Payer: Self-pay

## 2022-01-05 VITALS — BP 104/76 | HR 59 | Temp 97.8°F | Resp 16 | Wt 199.8 lb

## 2022-01-05 DIAGNOSIS — Z7901 Long term (current) use of anticoagulants: Secondary | ICD-10-CM | POA: Diagnosis not present

## 2022-01-05 DIAGNOSIS — I2699 Other pulmonary embolism without acute cor pulmonale: Secondary | ICD-10-CM

## 2022-01-05 DIAGNOSIS — Z86711 Personal history of pulmonary embolism: Secondary | ICD-10-CM | POA: Insufficient documentation

## 2022-01-05 LAB — CBC WITH DIFFERENTIAL (CANCER CENTER ONLY)
Abs Immature Granulocytes: 0.02 10*3/uL (ref 0.00–0.07)
Basophils Absolute: 0 10*3/uL (ref 0.0–0.1)
Basophils Relative: 1 %
Eosinophils Absolute: 0.2 10*3/uL (ref 0.0–0.5)
Eosinophils Relative: 3 %
HCT: 40.4 % (ref 39.0–52.0)
Hemoglobin: 13.7 g/dL (ref 13.0–17.0)
Immature Granulocytes: 0 %
Lymphocytes Relative: 33 %
Lymphs Abs: 1.9 10*3/uL (ref 0.7–4.0)
MCH: 27.7 pg (ref 26.0–34.0)
MCHC: 33.9 g/dL (ref 30.0–36.0)
MCV: 81.6 fL (ref 80.0–100.0)
Monocytes Absolute: 0.8 10*3/uL (ref 0.1–1.0)
Monocytes Relative: 14 %
Neutro Abs: 2.8 10*3/uL (ref 1.7–7.7)
Neutrophils Relative %: 49 %
Platelet Count: 186 10*3/uL (ref 150–400)
RBC: 4.95 MIL/uL (ref 4.22–5.81)
RDW: 15.9 % — ABNORMAL HIGH (ref 11.5–15.5)
WBC Count: 5.6 10*3/uL (ref 4.0–10.5)
nRBC: 0 % (ref 0.0–0.2)

## 2022-01-05 LAB — CMP (CANCER CENTER ONLY)
ALT: 22 U/L (ref 0–44)
AST: 24 U/L (ref 15–41)
Albumin: 4.3 g/dL (ref 3.5–5.0)
Alkaline Phosphatase: 63 U/L (ref 38–126)
Anion gap: 4 — ABNORMAL LOW (ref 5–15)
BUN: 18 mg/dL (ref 8–23)
CO2: 27 mmol/L (ref 22–32)
Calcium: 9.5 mg/dL (ref 8.9–10.3)
Chloride: 101 mmol/L (ref 98–111)
Creatinine: 0.95 mg/dL (ref 0.61–1.24)
GFR, Estimated: >60 mL/min (ref >60)
Glucose, Bld: 94 mg/dL (ref 70–99)
Potassium: 4 mmol/L (ref 3.5–5.1)
Sodium: 132 mmol/L — ABNORMAL LOW (ref 135–145)
Total Bilirubin: 0.5 mg/dL (ref 0.3–1.2)
Total Protein: 7.5 g/dL (ref 6.5–8.1)

## 2022-01-05 NOTE — Progress Notes (Signed)
[place] [phone]   [phone]  PROGRESS NOTE  Patient Care Team: Tammi Sou, MD as PCP - General (Family Medicine) Juanita Craver, MD as Consulting Physician (Gastroenterology) Francee Gentile, [place] as Referring Physician (Chiropractic Medicine) Romero Belling, MD as Referring Physician (Dermatology) Bjorn Loser, MD as Consulting Physician (Urology)  Hematological/Oncological History # Hx of unprovoked pulmonary emboli  1) 09/18/2020-09/21/2020: Admitted for acute pulmonary emboli after presenting to the ED shortness of breath, sternal chest pain and upper abdominal pain. CTA chest confirmed multi segment pulmonary emboli in the right lower lobe with pulmonary infarct/ischemia. No evidence of heart strain. Patient was started on heparin and transitioned to Eliquis.   2) 10/16/2020: Establish care with Dede Query PA-C. Recommended indefinite anticoagulation due to unprovoked PE.   3) 04/18/2021: Recommend to transition to maintenance dose of Eliquis 2.5 mg twice daily.   HISTORY OF PRESENTING ILLNESS:  Shawn [name] 76 y.o. male returns for follow-up for history of unprovoked pulmonary emboli currently on indefinite anticoagulation with Eliquis. He is unaccompanied for this visit.  At today's visit, Mr. Marcell reports he is tolerating Eliquis therapy well without any bleeding or bruising.  He notes that he is taking the medication 2.5 mg twice daily as prescribed.  He did recently hit himself in the face with a car door approximate 3 to 4 days ago.  He reports that he has had some bleeding at that site and he had to change the bandages twice due to being "soaked".  He reports that it drained on the quarter-inch but he was able to get the bleeding under control.  He notes that he does occasionally have some issues with postnasal drip that causes some mild cough and bronchitis.  He is not having any signs or symptoms concerning for blood clot.  He is  disheartened that he does appear to be gaining weight.  He reports that he did lose 34 pounds while in the hospital approximately 4 months ago but has been increasing his weight ever since.  He reports he is also working with PT in order to increase his left leg strength.  He has been eating more vegetables, in particular broccoli.  Patient denies easy bruising or signs of active bleeding.  He denies any fevers, chills, shortness of breath, chest pain or cough.He has no other complaints. Rest of the 10 point ROS is below.  MEDICAL HISTORY:  Past Medical History:  Diagnosis Date   Acute urinary retention 12/2018   ?BPH   Asymmetrical sensorineural hearing loss 2021   Likely from noise exposure.  [facility] [phone] ENT referral for this dx being relatively new.   Barrett's esophagus    Dr. Collene Mares   Cataract    Chronic renal insufficiency, stage 2 (mild) 2017   Stage II/III (GFR 60 ml/min)   GERD (gastroesophageal reflux disease)    History of adenomatous polyp of colon 09/2007   Polyps at 1st screening colonoscopy; 2014 no polyps.  Rpt 12/29/17: adenomatous polyp-->  Recall 5 yrs.   Hyperlipemia, mixed    Pt intol of welchol; fenofibrate helpful.  Fenofib inc to 145 and atorva '10mg'$  started 08/23/18-->great lipid panel 11/2018.   Hypertension    Lumbar spondylosis    2023 mild   Osteoarthritis of left hip    2023 moderate   Pulmonary embolism ([place]) 09/2020   with pulm infarct.  Unprovoked->hosp ref to hem/onc   Subclinical hypothyroidism    per old PCP records.  TPO  ab's elevated mildly 12/2017, with TSH 6.6 and normal T4 and T3==>developing Hashimoto's.    Thrombocytopenia ([place])    Hx of, mild/stable with monitoring per old PCP records    SURGICAL HISTORY: Past Surgical History:  Procedure Laterality Date   APPENDECTOMY     CATARACT EXTRACTION Bilateral 04/13/2018   05/14/2018   COLONOSCOPY  12/29/2017   polypectomy --path - adenoma,  Repeat in 5 years   COLONOSCOPY W/  POLYPECTOMY  2014   per Dr. Collene Mares, recall 02/2023   ESOPHAGOGASTRODUODENOSCOPY  2014; 2016; 11/16/16   2018--Barrett's esoph reconfirmed   TRANSTHORACIC ECHOCARDIOGRAM  09/19/2020   (in the setting of acute PE)->EF 70-75%, RV fxn mildly reduced, hyperdynamic LV fxn, normal PA pressure, no valve probs.   UPPER GI ENDOSCOPY  03/03/2019   2020.  03/03/21->small patch Barretts, multiple sessile gastric polyps, normal duodenum. Rpt 3 yrs.    SOCIAL HISTORY: Social History   Socioeconomic History   Marital status: Married    Spouse name: Not on file   Number of children: Not on file   Years of education: Not on file   Highest education level: Master's degree (e.g., MA, MS, MEng, MEd, MSW, MBA)  Occupational History   Not on file  Tobacco Use   Smoking status: Never   Smokeless tobacco: Never  Vaping Use   Vaping Use: Never used  Substance and Sexual Activity   Alcohol use: No   Drug use: No   Sexual activity: Not on file  Other Topics Concern   Not on file  Social History Narrative   Married, grand-daughter lives with him.   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   Orig from [place] and [place].   No T/A/Ds.   Exercises 3 X/week--walking 2-3 miles.   Social Determinants of Health   Financial Resource Strain: Low Risk  (06/25/2021)   Overall Financial Resource Strain (CARDIA)    Difficulty of Paying Living Expenses: Not hard at all  Food Insecurity: No Food Insecurity (06/25/2021)   Hunger Vital Sign    Worried About Running Out of Food in the Last Year: Never true    Ran Out of Food in the Last Year: Never true  Transportation Needs: No Transportation Needs (06/25/2021)   PRAPARE - Hydrologist (Medical): No    Lack of Transportation (Non-Medical): No  Physical Activity: Sufficiently Active (06/25/2021)   Exercise Vital Sign    Days of Exercise per Week: 5 days    Minutes of Exercise per Session: 30 min  Stress: No  Stress Concern Present (06/25/2021)   [place]    Feeling of Stress : Only a little  Social Connections: Socially Integrated (06/25/2021)   Social Connection and Isolation Panel [NHANES]    Frequency of Communication with Friends and Family: More than three times a week    Frequency of Social Gatherings with Friends and Family: Once a week    Attends Religious Services: More than 4 times per year    Active Member of [employer] or Organizations: Yes    Attends Archivist Meetings: More than 4 times per year    Marital Status: Married  Human resources officer Violence: Not At Risk (01/09/2021)   Humiliation, Afraid, Rape, and Kick questionnaire    Fear of Current or Ex-Partner: No    Emotionally Abused: No    Physically Abused: No    Sexually Abused: No  FAMILY HISTORY: Family History  Problem Relation Age of Onset   Hypertension Mother    Hyperlipidemia Mother    Arthritis Father    Diabetes Paternal Grandmother 45    ALLERGIES:  has No Known Allergies.  MEDICATIONS:  Current Outpatient Medications  Medication Sig Dispense Refill   apixaban (ELIQUIS) 2.5 MG TABS tablet Take 1 tablet (2.5 mg total) by mouth 2 (two) times daily. 180 tablet 3   atorvastatin (LIPITOR) 10 MG tablet Take 1 tablet (10 mg total) by mouth daily. 90 tablet 3   EPINEPHrine 0.3 mg/0.3 mL IJ SOAJ injection Inject 0.3 mg into the muscle as needed for anaphylaxis. (Patient not taking: Reported on 09/05/2021) 2 each 1   fenofibrate (TRICOR) 145 MG tablet Take 1 tablet (145 mg total) by mouth daily. 90 tablet 3   fluorouracil (EFUDEX) 5 % cream Apply topically.     fosinopril (MONOPRIL) 20 MG tablet TAKE 1 TABLET EVERY MORNING 90 tablet 0   hydrochlorothiazide (HYDRODIURIL) 25 MG tablet Take 0.5 tablets (12.5 mg total) by mouth daily. 45 tablet 3   omeprazole (PRILOSEC) 40 MG capsule Take 40 mg by mouth daily.     Polyethyl Glycol-Propyl Glycol  (SYSTANE OP) Place 1 drop into both eyes as needed (dry eyes).     No current facility-administered medications for this visit.    REVIEW OF SYSTEMS:   Constitutional: ( - ) fevers, ( - )  chills , ( - ) night sweats Eyes: ( - ) blurriness of vision, ( - ) double vision, ( - ) watery eyes Ears, nose, mouth, throat, and face: ( - ) mucositis, ( - ) sore throat Respiratory: ( - ) cough, ( - ) dyspnea, ( - ) wheezes Cardiovascular: ( - ) palpitation, ( - ) chest discomfort, ( - ) lower extremity swelling Gastrointestinal:  ( - ) nausea, ( - ) heartburn, ( - ) change in bowel habits Skin: ( + ) abnormal skin rashes Lymphatics: ( - ) new lymphadenopathy, ( - ) easy bruising Neurological: ( - ) numbness, ( - ) tingling, ( - ) new weaknesses Behavioral/Psych: ( - ) mood change, ( - ) new changes  All other systems were reviewed with the patient and are negative.  PHYSICAL EXAMINATION: ECOG PERFORMANCE STATUS: 0 - Asymptomatic  Vitals:   01/05/22 1032  BP: 104/76  Pulse: (!) 59  Resp: 16  Temp: 97.8 F (36.6 C)  SpO2: 100%    Filed Weights   01/05/22 1032  Weight: 199 lb 12.8 oz (90.6 kg)     GENERAL: well appearing male in NAD  SKIN: skin color, texture, turgor are normal, no rashes or significant lesions EYES: conjunctiva are pink and non-injected, sclera clear OROPHARYNX: no exudate, no erythema; lips, buccal mucosa, and tongue normal  LUNGS: clear to auscultation and percussion with normal breathing effort HEART: regular rate & rhythm and no murmurs and no lower extremity edema Musculoskeletal: no cyanosis of digits and no clubbing  PSYCH: alert & oriented x 3, fluent speech NEURO: no focal motor/sensory deficits  LABORATORY DATA:  I have reviewed the data as listed    Latest Ref Rng & Units 01/05/2022    9:54 AM 04/18/2021   10:18 AM 01/15/2021    9:53 AM  CBC  WBC 4.0 - 10.5 K/uL 5.6  4.6  5.2   Hemoglobin 13.0 - 17.0 g/dL 13.7  13.1  13.3   Hematocrit 39.0 - 52.0 %  40.4  39.6  38.8  Platelets 150 - 400 K/uL 186  197  197        Latest Ref Rng & Units 01/05/2022    9:54 AM 12/04/2021    9:35 AM 04/18/2021   10:18 AM  CMP  Glucose 70 - 99 mg/dL 94  88  91   BUN 8 - 23 mg/dL '18  19  22   '$ Creatinine 0.61 - 1.24 mg/dL 0.95  0.97  1.06   Sodium 135 - 145 mmol/L 132  138  135   Potassium 3.5 - 5.1 mmol/L 4.0  3.8  3.8   Chloride 98 - 111 mmol/L 101  103  103   CO2 22 - 32 mmol/L '27  24  24   '$ Calcium 8.9 - 10.3 mg/dL 9.5  9.5  9.7   Total Protein 6.5 - 8.1 g/dL 7.5  7.1  7.3   Total Bilirubin 0.3 - 1.2 mg/dL 0.5  0.6  0.6   Alkaline Phos 38 - 126 U/L 63  46  46   AST 15 - 41 U/L '24  23  29   '$ ALT 0 - 44 U/L '22  15  17     '$ RADIOGRAPHIC STUDIES: I have personally reviewed the radiological images as listed and agreed with the findings in the report. DG Knee Complete 4 Views Left  Result Date: 01/03/2022 CLINICAL DATA:  Left knee pain. EXAM: LEFT KNEE - COMPLETE 4+ VIEW COMPARISON:  None Available. FINDINGS: Normal alignment. The joint spaces are preserved. There is trace peripheral spurring in the medial tibiofemoral and patellofemoral compartments. Small quadriceps tendon enthesophyte. No knee joint effusion. No fracture, erosion or focal bone abnormality. Unremarkable soft tissues. IMPRESSION: Trace osteoarthritis in the medial tibiofemoral and patellofemoral compartments. Electronically Signed   By: Keith Rake M.D.   On: 01/03/2022 10:42   DG HIP UNILAT WITH PELVIS 2-3 VIEWS LEFT  Result Date: 01/03/2022 CLINICAL DATA:  Left hip pain.  Chronic pain. EXAM: DG HIP (WITH OR WITHOUT PELVIS) 2-3V LEFT COMPARISON:  None Available. FINDINGS: There is moderate joint space narrowing of the left hip joint. Acetabular spurring with subchondral cystic change. Chondrocalcinosis versus labral calcifications. Small greater trochanteric enthesophyte. No evidence of acute or prior fracture. No erosive change or bony destruction. The pubic rami and remainder of the  bony pelvis is intact. Pubic symphysis is congruent. IMPRESSION: 1. Moderate osteoarthritis of the left hip joint. 2. Chondrocalcinosis versus labral calcifications. Electronically Signed   By: Keith Rake M.D.   On: 01/03/2022 10:41   DG Lumbar Spine Complete  Result Date: 01/03/2022 CLINICAL DATA:  Left-sided low back pain with left-sided sciatica. Left low back pain with left leg pain. Patient reports chronic low back pain. EXAM: LUMBAR SPINE - COMPLETE 4+ VIEW COMPARISON:  None Available. FINDINGS: There are 5 non-rib-bearing lumbar vertebra. Normal alignment without listhesis. Normal vertebral body heights. Mild anterior spurring at multiple levels with slight L2-L3 disc space narrowing. There is moderate L5-S1 and mild L4-L5 facet hypertrophy. No evidence of fracture or focal bone abnormality. The sacroiliac joints are congruent with mild degenerative change. IMPRESSION: 1. Moderate L5-S1 and mild L4-L5 facet hypertrophy. 2. Mild spondylosis with spurring with slight disc space narrowing at L2-L3. Electronically Signed   By: Keith Rake M.D.   On: 01/03/2022 10:38    ASSESSMENT & PLAN GOVIND FUREY is a 76 y.o. male returns for routine follow-up for history of pulmonary emboli.  # H/O pulmonary emboli involving the right lower lobe: -Unprovoked VTE so recommendation  is indefinite anticoagulation. -Labs from 10/16/2020 showed mild elevation of anticardiolipin IgG and IgM antibodies and normal beta-2 glycoprotein antibodies.Does not meet criteria for antiphospholipid syndrome.  -Currently on Eliquis maintenance 2.5 mg twice daily. Denies easy bruising or signs of active bleeding.  -Discussed that it is important to continued on anticoagulation rather than Asprin alone to minimize risk of recurrent thromboembolisms.  --Labs today show white blood cell count 5.6, hemoglobin 13.7, platelets 186.  AST 24, ALT 22 with creatinine of 0.95.  Labs adequate for continued Eliquis  treatment. -Patient's intermittent symptoms of rash/lip swelling could be secondary to Eliquis although it seems to be mild. Suggested that he can switch to a different medication such as Xarelto if needed. Patient would like to monitor symptoms and track to see if there are any other causes.  -RTC in 6 months with labs to monitor.   No orders of the defined types were placed in this encounter.   All questions were answered. The patient knows to call the clinic with any problems, questions or concerns.  I have spent a total of 30 minutes minutes of face-to-face and non-face-to-face time, preparing to see the patient, performing a medically appropriate examination, counseling and educating the patient, documenting clinical information in the electronic health record, and care coordination.   Ledell Peoples, MD Department of Hematology/Oncology [place] [facility] Phone: [phone] Pager: [phone] Email: [email or web address]

## 2022-01-07 DIAGNOSIS — M7062 Trochanteric bursitis, left hip: Secondary | ICD-10-CM | POA: Diagnosis not present

## 2022-01-07 DIAGNOSIS — M7632 Iliotibial band syndrome, left leg: Secondary | ICD-10-CM | POA: Diagnosis not present

## 2022-01-08 ENCOUNTER — Ambulatory Visit: Payer: TRICARE For Life (TFL) | Admitting: Hematology and Oncology

## 2022-01-08 ENCOUNTER — Other Ambulatory Visit: Payer: TRICARE For Life (TFL)

## 2022-01-09 DIAGNOSIS — M7632 Iliotibial band syndrome, left leg: Secondary | ICD-10-CM | POA: Diagnosis not present

## 2022-01-09 DIAGNOSIS — M7062 Trochanteric bursitis, left hip: Secondary | ICD-10-CM | POA: Diagnosis not present

## 2022-01-13 DIAGNOSIS — M7632 Iliotibial band syndrome, left leg: Secondary | ICD-10-CM | POA: Diagnosis not present

## 2022-01-13 DIAGNOSIS — M7062 Trochanteric bursitis, left hip: Secondary | ICD-10-CM | POA: Diagnosis not present

## 2022-01-15 ENCOUNTER — Encounter: Payer: Self-pay | Admitting: Family Medicine

## 2022-01-15 ENCOUNTER — Ambulatory Visit (INDEPENDENT_AMBULATORY_CARE_PROVIDER_SITE_OTHER): Payer: Medicare Other | Admitting: Family Medicine

## 2022-01-15 VITALS — BP 123/71 | HR 57 | Temp 97.6°F | Ht 70.0 in | Wt 199.0 lb

## 2022-01-15 DIAGNOSIS — M25552 Pain in left hip: Secondary | ICD-10-CM

## 2022-01-15 DIAGNOSIS — M1612 Unilateral primary osteoarthritis, left hip: Secondary | ICD-10-CM

## 2022-01-15 DIAGNOSIS — M7632 Iliotibial band syndrome, left leg: Secondary | ICD-10-CM

## 2022-01-15 DIAGNOSIS — M7062 Trochanteric bursitis, left hip: Secondary | ICD-10-CM | POA: Diagnosis not present

## 2022-01-15 MED ORDER — FOSINOPRIL SODIUM 20 MG PO TABS: 20.0000 mg | ORAL_TABLET | Freq: Every morning | ORAL | 1 refills | Status: DC

## 2022-01-15 NOTE — Progress Notes (Signed)
OFFICE VISIT  01/15/2022  CC:  Chief Complaint  Patient presents with   Follow-up    Left leg pain; went to PT and he learned some strength exercises. He does not feel like there has been much improvement since last visit. Pain level today is 5/10. Left hamstring pain seems to causing calf and ankle pain.     HPI:    Patient is a 76 y.o. male who presents for ongoing left leg pain. I last saw him for this 12/04/21. 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 He does have some symptoms referable to the greater troch region, which could be part of his IT band tightening and could be causing some bursitis around the gluteal tendons. I think he would benefit rightly from physical therapy--ordered today. No medications prescribed for this. Discussed possibility of ultrasound-guided steroid injection in the appropriate location(s) If not showing significant improvement in 6 weeks and as long as we do not think there is any alternative diagnosis complicating things."  INTERIM HX: PT has helped some. Feels like some of the exercises stresses hamstrings a little too much. He is feeling like he wants to get an orthopedist opinion on things. He still finds his pain hard to describe but does seem to localize it to posterolateral aspect of the left hip and down the lateral aspect of the thigh and many times extending over the lateral aspect of the knee and down into the ankle. Seems to hurt much worse after lots of activity and even is tender in these locations after lots of activity. Denies paresthesias or leg weakness.   Lumbar spine x-rays 01/02/2022-->IMPRESSION: 1. Moderate L5-S1 and mild L4-L5 facet hypertrophy. 2. Mild spondylosis with spurring with slight disc space narrowing at L2-L3. Left hip x-rays 01/02/2022->IMPRESSION: 1. Moderate osteoarthritis of the left hip joint. 2. Chondrocalcinosis versus labral calcifications. Left knee x-rays  01/02/2022->IMPRESSION: Trace osteoarthritis in the medial tibiofemoral and patellofemoral compartments.   Past Medical History:  Diagnosis Date   Acute urinary retention 12/2018   ?BPH   Asymmetrical sensorineural hearing loss 2021   Likely from noise exposure.  [facility] [phone] ENT referral for this dx being relatively new.   Barrett's esophagus    Dr. Collene Mares   Cataract    Chronic renal insufficiency, stage 2 (mild) 2017   Stage II/III (GFR 60 ml/min)   GERD (gastroesophageal reflux disease)    History of adenomatous polyp of colon 09/2007   Polyps at 1st screening colonoscopy; 2014 no polyps.  Rpt 12/29/17: adenomatous polyp-->  Recall 5 yrs.   Hyperlipemia, mixed    Pt intol of welchol; fenofibrate helpful.  Fenofib inc to 145 and atorva '10mg'$  started 08/23/18-->great lipid panel 11/2018.   Hypertension    Lumbar spondylosis    2023 mild   Osteoarthritis of left hip    2023 moderate   Pulmonary embolism ([place]) 09/2020   with pulm infarct.  Unprovoked->hosp ref to hem/onc.  Pulm recommended anticoag indefinitely.   Subclinical hypothyroidism    per old PCP records.  TPO ab's elevated mildly 12/2017, with TSH 6.6 and normal T4 and T3==>developing Hashimoto's.    Thrombocytopenia ([place])    Hx of, mild/stable with monitoring per old PCP records    Past Surgical History:  Procedure Laterality Date   APPENDECTOMY     CATARACT EXTRACTION Bilateral 04/13/2018   05/14/2018   COLONOSCOPY  12/29/2017   polypectomy --path - adenoma,  Repeat in 5 years  COLONOSCOPY W/ POLYPECTOMY  2014   per Dr. Collene Mares, recall 02/2023   ESOPHAGOGASTRODUODENOSCOPY  2014; 2016; 11/16/16   2018--Barrett's esoph reconfirmed   TRANSTHORACIC ECHOCARDIOGRAM  09/19/2020   (in the setting of acute PE)->EF 70-75%, RV fxn mildly reduced, hyperdynamic LV fxn, normal PA pressure, no valve probs.   UPPER GI ENDOSCOPY  03/03/2019   2020.  03/03/21->small patch Barretts, multiple sessile gastric polyps,  normal duodenum. Rpt 3 yrs.    Outpatient Medications Prior to Visit  Medication Sig Dispense Refill   apixaban (ELIQUIS) 2.5 MG TABS tablet Take 1 tablet (2.5 mg total) by mouth 2 (two) times daily. 180 tablet 3   atorvastatin (LIPITOR) 10 MG tablet Take 1 tablet (10 mg total) by mouth daily. 90 tablet 3   fluorouracil (EFUDEX) 5 % cream Apply topically.     hydrochlorothiazide (HYDRODIURIL) 25 MG tablet Take 0.5 tablets (12.5 mg total) by mouth daily. 45 tablet 3   omeprazole (PRILOSEC) 40 MG capsule Take 40 mg by mouth daily.     Polyethyl Glycol-Propyl Glycol (SYSTANE OP) Place 1 drop into both eyes as needed (dry eyes).     EPINEPHrine 0.3 mg/0.3 mL IJ SOAJ injection Inject 0.3 mg into the muscle as needed for anaphylaxis. (Patient not taking: Reported on 09/05/2021) 2 each 1   fenofibrate (TRICOR) 145 MG tablet Take 1 tablet (145 mg total) by mouth daily. 90 tablet 3   fosinopril (MONOPRIL) 20 MG tablet TAKE 1 TABLET EVERY MORNING 90 tablet 0   No facility-administered medications prior to visit.    No Known Allergies  ROS As per HPI  PE:    01/15/2022   10:02 AM 01/05/2022   10:32 AM 12/04/2021    9:03 AM  Vitals with BMI  Height '5\' 10"'$   '5\' 10"'$   Weight 199 lbs 199 lbs 13 oz 198 lbs 3 oz  BMI 96.78  93.81  Systolic 017 510 258  Diastolic 71 76 70  Pulse 57 59 57   Physical Exam  : Alert and well-appearing. Low back without tenderness. No ischial tuberosity tenderness. He has tenderness directly over the greater trochanter on the left. No other tenderness down the leg.  External rotation of the left hip is limited a bit but internal rotation is significantly limited due to pain and stiffness. Flexion of the hip is full and without pain.  LABS:  Last CBC Lab Results  Component Value Date   WBC 5.6 01/05/2022   HGB 13.7 01/05/2022   HCT 40.4 01/05/2022   MCV 81.6 01/05/2022   MCH 27.7 01/05/2022   RDW 15.9 (H) 01/05/2022   PLT 186 52/77/8242   Last metabolic  panel Lab Results  Component Value Date   GLUCOSE 94 01/05/2022   NA 132 (L) 01/05/2022   K 4.0 01/05/2022   CL 101 01/05/2022   CO2 27 01/05/2022   BUN 18 01/05/2022   CREATININE 0.95 01/05/2022   GFRNONAA >60 01/05/2022   CALCIUM 9.5 01/05/2022   PROT 7.5 01/05/2022   ALBUMIN 4.3 01/05/2022   BILITOT 0.5 01/05/2022   ALKPHOS 63 01/05/2022   AST 24 01/05/2022   ALT 22 01/05/2022   ANIONGAP 4 (L) 01/05/2022   IMPRESSION AND PLAN:  Left leg pain. 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 PT has helped a mild to moderate amount. We will refer to orthopedics, specifically patient requests Dr. Ninfa Linden.  An After Visit Summary was printed and given to  the patient.  FOLLOW UP: Return for as needed.  Signed:  Crissie Sickles, MD           01/15/2022

## 2022-02-02 ENCOUNTER — Other Ambulatory Visit: Payer: Self-pay

## 2022-02-02 ENCOUNTER — Ambulatory Visit (INDEPENDENT_AMBULATORY_CARE_PROVIDER_SITE_OTHER): Payer: Medicare Other | Admitting: Orthopaedic Surgery

## 2022-02-02 DIAGNOSIS — M25552 Pain in left hip: Secondary | ICD-10-CM

## 2022-02-02 NOTE — Progress Notes (Signed)
The patient is a 76 year old gentleman I am seeing for the first time.  He is referred to me evaluate and treat left hip and left knee pain but it seems like it may be his IT band.  However he has been through physical therapy significantly for this area and it is actually made things worse for him.  He does ambulate with a cane in his opposite side.  He said physical therapy did help get his cane at the appropriate level.  He does report some groin pain on the left side.  His left knee pain is only over the IT band is where he points to.  This been going on for some time now and is slowly getting worse.  Even we see him ambulate he does have a Trendelenburg gait favoring the left side.  On exam there is stiffness in the left hip with internal and external rotation but extremes of rotation.  There is pain over the trochanteric area and the IT band proximally and down to the knee.  There is no knee joint effusion and his knee exam is entirely normal with full range of motion.  His left knee is ligamentously stable as well.  X-rays of the left knee on the canopy system show normal-appearing knee joint.  X-rays of his pelvis and left hip x-rays showed moderate arthritic changes with joint space narrowing as well as sclerotic and cystic changes and para-articular osteophytes.  There is spurring off of the tip of the trochanteric area on both hips suggesting probably chronic tearing of the medius and minimus tendons or just chronic proximal IT band syndrome.  Based on his clinical exam findings and plain film findings, a MRI of his left hip is warranted to assess the cartilage of this hip on that left side as well as assessing the gluteus medius and minimus tendons in the trochanteric area.  He agrees with this treatment plan.  We will see him back once we have this MRI.  I think it is medically necessary at this point given the failure of all forms of conservative treatment including physical therapy and  chiropractic treatment as well as rest, offloading his hip with a cane and anti-inflammatories.

## 2022-02-12 ENCOUNTER — Telehealth: Payer: Self-pay | Admitting: Family Medicine

## 2022-02-12 NOTE — Telephone Encounter (Signed)
Pt declined Medicare well visit. States as long as he is seeing Dr. Anitra Lauth on a rountine basis he doesn't got to these.

## 2022-02-21 ENCOUNTER — Ambulatory Visit
Admission: RE | Admit: 2022-02-21 | Discharge: 2022-02-21 | Disposition: A | Discharge status: Home / Self Care | Payer: Medicare Other | Source: Ambulatory Visit | Attending: Orthopaedic Surgery | Admitting: Orthopaedic Surgery

## 2022-02-21 DIAGNOSIS — M25552 Pain in left hip: Secondary | ICD-10-CM

## 2022-02-21 DIAGNOSIS — M1612 Unilateral primary osteoarthritis, left hip: Secondary | ICD-10-CM | POA: Diagnosis not present

## 2022-02-24 ENCOUNTER — Encounter: Payer: Self-pay | Admitting: Orthopaedic Surgery

## 2022-02-24 ENCOUNTER — Ambulatory Visit (INDEPENDENT_AMBULATORY_CARE_PROVIDER_SITE_OTHER): Payer: Medicare Other | Admitting: Orthopaedic Surgery

## 2022-02-24 DIAGNOSIS — M1612 Unilateral primary osteoarthritis, left hip: Secondary | ICD-10-CM | POA: Diagnosis not present

## 2022-02-24 DIAGNOSIS — M25552 Pain in left hip: Secondary | ICD-10-CM

## 2022-02-24 NOTE — Progress Notes (Signed)
The patient comes in today to go over MRI of his left hip.  He is ambulate the cane has been having a lot of pain around the lateral aspect of his hip and the hip flexor tendons themselves.  He does see a chiropractor for many years now and adjustments have helped him quite a bit with his low back.  X-ray findings showed some mild to moderate arthritic changes of his left hip.  He is stiff but very active.  He is increased supplements in his diet and green leafy vegetables and has help from an anemia standpoint.  His left hip has some stiffness with the extremes of rotation but does move well.  He has pain when he lays on the lateral aspect of his left hip.  MRI of his left hip does show moderate arthritic changes with subchondral edema and cystic changes in the weightbearing surface of the left hip.  There is degenerative labral tearing as well.  I did let him know about the moderate arthritis of his left hip and showed him a hip replacement model and hip model in detail.  He is still getting along well enough to not consider joint replacement surgery for now and I agree with this based on his clinical exam findings and his motivation as well as flexibility.  If things worsen he will let us know.  We can always set him up for an intra-articular steroid injection under ultrasound the left hip if needed.  All questions and concerns were answered and addressed.

## 2022-03-20 ENCOUNTER — Other Ambulatory Visit: Payer: Self-pay | Admitting: Family Medicine

## 2022-04-28 ENCOUNTER — Telehealth: Payer: Self-pay | Admitting: Family Medicine

## 2022-04-28 NOTE — Telephone Encounter (Signed)
Chart updated

## 2022-04-28 NOTE — Telephone Encounter (Signed)
Spoke with patient he declined AWV not needed

## 2022-05-25 ENCOUNTER — Encounter: Payer: Self-pay | Admitting: Family Medicine

## 2022-05-25 DIAGNOSIS — M1612 Unilateral primary osteoarthritis, left hip: Secondary | ICD-10-CM

## 2022-05-25 DIAGNOSIS — G8929 Other chronic pain: Secondary | ICD-10-CM

## 2022-05-26 NOTE — Telephone Encounter (Signed)
Sure. I will order referral to Dr. Mardelle Matte now.

## 2022-05-26 NOTE — Telephone Encounter (Signed)
Noted  

## 2022-06-03 DIAGNOSIS — M1612 Unilateral primary osteoarthritis, left hip: Secondary | ICD-10-CM | POA: Diagnosis not present

## 2022-06-18 ENCOUNTER — Other Ambulatory Visit: Payer: Self-pay | Admitting: Family Medicine

## 2022-06-30 DIAGNOSIS — K2270 Barrett's esophagus without dysplasia: Secondary | ICD-10-CM | POA: Diagnosis not present

## 2022-06-30 DIAGNOSIS — K219 Gastro-esophageal reflux disease without esophagitis: Secondary | ICD-10-CM | POA: Diagnosis not present

## 2022-06-30 DIAGNOSIS — Z1211 Encounter for screening for malignant neoplasm of colon: Secondary | ICD-10-CM | POA: Diagnosis not present

## 2022-06-30 DIAGNOSIS — Z86010 Personal history of colonic polyps: Secondary | ICD-10-CM | POA: Diagnosis not present

## 2022-06-30 DIAGNOSIS — R194 Change in bowel habit: Secondary | ICD-10-CM | POA: Diagnosis not present

## 2022-07-06 ENCOUNTER — Other Ambulatory Visit: Payer: Self-pay | Admitting: Physician Assistant

## 2022-07-06 DIAGNOSIS — Z86711 Personal history of pulmonary embolism: Secondary | ICD-10-CM

## 2022-07-06 DIAGNOSIS — I2699 Other pulmonary embolism without acute cor pulmonale: Secondary | ICD-10-CM

## 2022-07-07 ENCOUNTER — Inpatient Hospital Stay: Payer: Medicare Other | Attending: Physician Assistant | Admitting: Physician Assistant

## 2022-07-07 ENCOUNTER — Inpatient Hospital Stay: Payer: Medicare Other

## 2022-07-07 VITALS — BP 144/92 | HR 61 | Temp 97.9°F | Resp 14 | Wt 202.6 lb

## 2022-07-07 DIAGNOSIS — Z86711 Personal history of pulmonary embolism: Secondary | ICD-10-CM | POA: Diagnosis not present

## 2022-07-07 DIAGNOSIS — Z8719 Personal history of other diseases of the digestive system: Secondary | ICD-10-CM | POA: Insufficient documentation

## 2022-07-07 DIAGNOSIS — Z833 Family history of diabetes mellitus: Secondary | ICD-10-CM | POA: Diagnosis not present

## 2022-07-07 DIAGNOSIS — G8929 Other chronic pain: Secondary | ICD-10-CM | POA: Diagnosis not present

## 2022-07-07 DIAGNOSIS — Z7901 Long term (current) use of anticoagulants: Secondary | ICD-10-CM | POA: Insufficient documentation

## 2022-07-07 DIAGNOSIS — Z8261 Family history of arthritis: Secondary | ICD-10-CM | POA: Insufficient documentation

## 2022-07-07 DIAGNOSIS — I2699 Other pulmonary embolism without acute cor pulmonale: Secondary | ICD-10-CM | POA: Diagnosis not present

## 2022-07-07 DIAGNOSIS — Z9049 Acquired absence of other specified parts of digestive tract: Secondary | ICD-10-CM | POA: Diagnosis not present

## 2022-07-07 DIAGNOSIS — M25552 Pain in left hip: Secondary | ICD-10-CM | POA: Diagnosis not present

## 2022-07-07 DIAGNOSIS — Z8349 Family history of other endocrine, nutritional and metabolic diseases: Secondary | ICD-10-CM | POA: Diagnosis not present

## 2022-07-07 DIAGNOSIS — Z79899 Other long term (current) drug therapy: Secondary | ICD-10-CM | POA: Diagnosis not present

## 2022-07-07 DIAGNOSIS — Z86010 Personal history of colonic polyps: Secondary | ICD-10-CM | POA: Insufficient documentation

## 2022-07-07 DIAGNOSIS — Z8249 Family history of ischemic heart disease and other diseases of the circulatory system: Secondary | ICD-10-CM | POA: Insufficient documentation

## 2022-07-07 LAB — CBC WITH DIFFERENTIAL (CANCER CENTER ONLY)
Abs Immature Granulocytes: 0.01 10*3/uL (ref 0.00–0.07)
Basophils Absolute: 0 10*3/uL (ref 0.0–0.1)
Basophils Relative: 1 %
Eosinophils Absolute: 0.1 10*3/uL (ref 0.0–0.5)
Eosinophils Relative: 2 %
HCT: 44.7 % (ref 39.0–52.0)
Hemoglobin: 15 g/dL (ref 13.0–17.0)
Immature Granulocytes: 0 %
Lymphocytes Relative: 33 %
Lymphs Abs: 1.9 10*3/uL (ref 0.7–4.0)
MCH: 27.5 pg (ref 26.0–34.0)
MCHC: 33.6 g/dL (ref 30.0–36.0)
MCV: 82 fL (ref 80.0–100.0)
Monocytes Absolute: 0.9 10*3/uL (ref 0.1–1.0)
Monocytes Relative: 15 %
Neutro Abs: 2.9 10*3/uL (ref 1.7–7.7)
Neutrophils Relative %: 49 %
Platelet Count: 168 10*3/uL (ref 150–400)
RBC: 5.45 MIL/uL (ref 4.22–5.81)
RDW: 15.9 % — ABNORMAL HIGH (ref 11.5–15.5)
WBC Count: 5.8 10*3/uL (ref 4.0–10.5)
nRBC: 0 % (ref 0.0–0.2)

## 2022-07-07 LAB — CMP (CANCER CENTER ONLY)
ALT: 19 U/L (ref 0–44)
AST: 23 U/L (ref 15–41)
Albumin: 4.6 g/dL (ref 3.5–5.0)
Alkaline Phosphatase: 81 U/L (ref 38–126)
Anion gap: 5 (ref 5–15)
BUN: 16 mg/dL (ref 8–23)
CO2: 30 mmol/L (ref 22–32)
Calcium: 10.2 mg/dL (ref 8.9–10.3)
Chloride: 101 mmol/L (ref 98–111)
Creatinine: 0.93 mg/dL (ref 0.61–1.24)
GFR, Estimated: >60 mL/min
Glucose, Bld: 88 mg/dL (ref 70–99)
Potassium: 4.2 mmol/L (ref 3.5–5.1)
Sodium: 136 mmol/L (ref 135–145)
Total Bilirubin: 0.7 mg/dL (ref 0.3–1.2)
Total Protein: 7.6 g/dL (ref 6.5–8.1)

## 2022-07-07 NOTE — Progress Notes (Signed)
[facility] Telephone:([phone]) [phone]   Fax:(336) [phone]  PROGRESS NOTE  Patient Care Team: Tammi Sou, MD as PCP - General (Family Medicine) Juanita Craver, MD as Consulting Physician (Gastroenterology) Francee Gentile, [place] as Referring Physician (Chiropractic Medicine) Romero Belling, MD as Referring Physician (Dermatology) Bjorn Loser, MD as Consulting Physician (Urology)  Hematological/Oncological History # Hx of unprovoked pulmonary emboli  1) 09/18/2020-09/21/2020: Admitted for acute pulmonary emboli after presenting to the ED shortness of breath, sternal chest pain and upper abdominal pain. CTA chest confirmed multi segment pulmonary emboli in the right lower lobe with pulmonary infarct/ischemia. No evidence of heart strain. Patient was started on heparin and transitioned to Eliquis.   2) 10/16/2020: Establish care with Shawn Query PA-C. Recommended indefinite anticoagulation due to unprovoked PE.   3) 04/18/2021: Recommend to transition to maintenance dose of Eliquis 2.5 mg twice daily.   HISTORY OF PRESENTING ILLNESS:  Shawn [name] 77 y.o. male returns for follow-up for history of unprovoked pulmonary emboli currently on indefinite anticoagulation with Eliquis. He is unaccompanied for this visit.  At today's visit, [name] reports he continues to tolerate Eliquis therapy. He denies any easy bruising or signs of bleeding. He is having ongoing but chronic left hip pain. He is planning to undergo left hip replacement in June 2024. He reports that otherwise he is feeling well without any new or concerning symptoms. He is eating a well balanced diet with more vegetables. He enjoys trying to stay active and doing yardwork.  He denies any fevers, chills, shortness of breath, chest pain, cough, nausea, vomiting, diarrhea or constipation.He has no other complaints. Rest of the 10 point ROS is below.  MEDICAL HISTORY:  Past Medical History:  Diagnosis Date   Acute  urinary retention 12/2018   ?BPH   Asymmetrical sensorineural hearing loss 2021   Likely from noise exposure.  [facility] [phone] ENT referral for this dx being relatively new.   Barrett's esophagus    Dr. Collene Mares   Cataract    Chronic renal insufficiency, stage 2 (mild) 2017   Stage II/III (GFR 60 ml/min)   GERD (gastroesophageal reflux disease)    History of adenomatous polyp of colon 09/2007   Polyps at 1st screening colonoscopy; 2014 no polyps.  Rpt 12/29/17: adenomatous polyp-->  Recall 5 yrs.   Hyperlipemia, mixed    Pt intol of welchol; fenofibrate helpful.  Fenofib inc to 145 and atorva 10mg  started 08/23/18-->great lipid panel 11/2018.   Hypertension    Lumbar spondylosis    2023 mild   Osteoarthritis of left hip    2023 moderate   Pulmonary embolism ([place]) 09/2020   with pulm infarct.  Unprovoked->hosp ref to hem/onc.  Pulm recommended anticoag indefinitely.   Subclinical hypothyroidism    per old PCP records.  TPO ab's elevated mildly 12/2017, with TSH 6.6 and normal T4 and T3==>developing Hashimoto's.    Thrombocytopenia ([place])    Hx of, mild/stable with monitoring per old PCP records    SURGICAL HISTORY: Past Surgical History:  Procedure Laterality Date   APPENDECTOMY     CATARACT EXTRACTION Bilateral 04/13/2018   05/14/2018   COLONOSCOPY  12/29/2017   polypectomy --path - adenoma,  Repeat in 5 years   COLONOSCOPY W/ POLYPECTOMY  2014   per Dr. Collene Mares, recall 02/2023   ESOPHAGOGASTRODUODENOSCOPY  2014; 2016; 11/16/16   2018--Barrett's esoph reconfirmed   TRANSTHORACIC ECHOCARDIOGRAM  09/19/2020   (in the setting of acute PE)->EF 70-75%, RV fxn mildly reduced, hyperdynamic  LV fxn, normal PA pressure, no valve probs.   UPPER GI ENDOSCOPY  03/03/2019   2020.  03/03/21->small patch Barretts, multiple sessile gastric polyps, normal duodenum. Rpt 3 yrs.    SOCIAL HISTORY: Social History   Socioeconomic History   Marital status: Married    Spouse name: Not on  file   Number of children: Not on file   Years of education: Not on file   Highest education level: Master's degree (e.g., MA, MS, MEng, MEd, MSW, MBA)  Occupational History   Not on file  Tobacco Use   Smoking status: Never   Smokeless tobacco: Never  Vaping Use   Vaping Use: Never used  Substance and Sexual Activity   Alcohol use: No   Drug use: No   Sexual activity: Not on file  Other Topics Concern   Not on file  Social History Narrative   Married, grand-daughter lives with him.   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   Orig from [place] and [place].   No T/A/Ds.   Exercises 3 X/week--walking 2-3 miles.   Social Determinants of Health   Financial Resource Strain: Low Risk  (06/25/2021)   Overall Financial Resource Strain (CARDIA)    Difficulty of Paying Living Expenses: Not hard at all  Food Insecurity: No Food Insecurity (06/25/2021)   Hunger Vital Sign    Worried About Running Out of Food in the Last Year: Never true    Ran Out of Food in the Last Year: Never true  Transportation Needs: No Transportation Needs (06/25/2021)   PRAPARE - Hydrologist (Medical): No    Lack of Transportation (Non-Medical): No  Physical Activity: Sufficiently Active (06/25/2021)   Exercise Vital Sign    Days of Exercise per Week: 5 days    Minutes of Exercise per Session: 30 min  Stress: No Stress Concern Present (06/25/2021)   [place]    Feeling of Stress : Only a little  Social Connections: Socially Integrated (06/25/2021)   Social Connection and Isolation Panel [NHANES]    Frequency of Communication with Friends and Family: More than three times a week    Frequency of Social Gatherings with Friends and Family: Once a week    Attends Religious Services: More than 4 times per year    Active Member of [employer] or Organizations: Yes    Attends Arts administrator: More than 4 times per year    Marital Status: Married  Human resources officer Violence: Not At Risk (01/09/2021)   Humiliation, Afraid, Rape, and Kick questionnaire    Fear of Current or Ex-Partner: No    Emotionally Abused: No    Physically Abused: No    Sexually Abused: No    FAMILY HISTORY: Family History  Problem Relation Age of Onset   Hypertension Mother    Hyperlipidemia Mother    Arthritis Father    Diabetes Paternal Grandmother 29    ALLERGIES:  has No Known Allergies.  MEDICATIONS:  Current Outpatient Medications  Medication Sig Dispense Refill   atorvastatin (LIPITOR) 10 MG tablet Take 1 tablet (10 mg total) by mouth daily. 90 tablet 3   ELIQUIS 2.5 MG TABS tablet TAKE 1 TABLET TWICE A DAY (OFFICE VISIT NEEDED FOR FURTHER REFILLS) 180 tablet 3   fluorouracil (EFUDEX) 5 % cream Apply topically.     fosinopril (MONOPRIL) 20 MG tablet Take 1 tablet (20 mg total) by mouth  every morning. 90 tablet 1   hydrochlorothiazide (HYDRODIURIL) 25 MG tablet Take 0.5 tablets (12.5 mg total) by mouth daily. 45 tablet 3   omeprazole (PRILOSEC) 40 MG capsule Take 40 mg by mouth daily.     Polyethyl Glycol-Propyl Glycol (SYSTANE OP) Place 1 drop into both eyes as needed (dry eyes).     EPINEPHrine 0.3 mg/0.3 mL IJ SOAJ injection Inject 0.3 mg into the muscle as needed for anaphylaxis. (Patient not taking: Reported on 09/05/2021) 2 each 1   No current facility-administered medications for this visit.    REVIEW OF SYSTEMS:   Constitutional: ( - ) fevers, ( - )  chills , ( - ) night sweats Eyes: ( - ) blurriness of vision, ( - ) double vision, ( - ) watery eyes Ears, nose, mouth, throat, and face: ( - ) mucositis, ( - ) sore throat Respiratory: ( - ) cough, ( - ) dyspnea, ( - ) wheezes Cardiovascular: ( - ) palpitation, ( - ) chest discomfort, ( - ) lower extremity swelling Gastrointestinal:  ( - ) nausea, ( - ) heartburn, ( - ) change in bowel habits Skin: ( + ) abnormal skin  rashes Lymphatics: ( - ) new lymphadenopathy, ( - ) easy bruising Neurological: ( - ) numbness, ( - ) tingling, ( - ) new weaknesses Behavioral/Psych: ( - ) mood change, ( - ) new changes  All other systems were reviewed with the patient and are negative.  PHYSICAL EXAMINATION: ECOG PERFORMANCE STATUS: 0 - Asymptomatic  Vitals:   07/07/22 1025  BP: (!) 144/92  Pulse: 61  Resp: 14  Temp: 97.9 F (36.6 C)  SpO2: 97%    Filed Weights   07/07/22 1025  Weight: 202 lb 9.6 oz (91.9 kg)     GENERAL: well appearing male in NAD  SKIN: skin color, texture, turgor are normal, no rashes or significant lesions EYES: conjunctiva are pink and non-injected, sclera clear OROPHARYNX: no exudate, no erythema; lips, buccal mucosa, and tongue normal  LUNGS: clear to auscultation and percussion with normal breathing effort HEART: regular rate & rhythm and no murmurs and no lower extremity edema Musculoskeletal: no cyanosis of digits and no clubbing  PSYCH: alert & oriented x 3, fluent speech NEURO: no focal motor/sensory deficits  LABORATORY DATA:  I have reviewed the data as listed    Latest Ref Rng & Units 07/07/2022    9:46 AM 01/05/2022    9:54 AM 04/18/2021   10:18 AM  CBC  WBC 4.0 - 10.5 K/uL 5.8  5.6  4.6   Hemoglobin 13.0 - 17.0 g/dL 15.0  13.7  13.1   Hematocrit 39.0 - 52.0 % 44.7  40.4  39.6   Platelets 150 - 400 K/uL 168  186  197        Latest Ref Rng & Units 07/07/2022    9:46 AM 01/05/2022    9:54 AM 12/04/2021    9:35 AM  CMP  Glucose 70 - 99 mg/dL 88  94  88   BUN 8 - 23 mg/dL 16  18  19    Creatinine 0.61 - 1.24 mg/dL 0.93  0.95  0.97   Sodium 135 - 145 mmol/L 136  132  138   Potassium 3.5 - 5.1 mmol/L 4.2  4.0  3.8   Chloride 98 - 111 mmol/L 101  101  103   CO2 22 - 32 mmol/L 30  27  24    Calcium 8.9 - 10.3 mg/dL 10.2  9.5  9.5   Total Protein 6.5 - 8.1 g/dL 7.6  7.5  7.1   Total Bilirubin 0.3 - 1.2 mg/dL 0.7  0.5  0.6   Alkaline Phos 38 - 126 U/L 81  63  46   AST  15 - 41 U/L 23  24  23    ALT 0 - 44 U/L 19  22  15      RADIOGRAPHIC STUDIES: I have personally reviewed the radiological images as listed and agreed with the findings in the report. No results found.  ASSESSMENT & PLAN Shawn [name] is a 77 y.o. male returns for routine follow-up for history of pulmonary emboli.  # H/O pulmonary emboli involving the right lower lobe: -Unprovoked VTE so recommendation is indefinite anticoagulation. -Labs from 10/16/2020 showed mild elevation of anticardiolipin IgG and IgM antibodies and normal beta-2 glycoprotein antibodies.Does not meet criteria for antiphospholipid syndrome.  -Currently on Eliquis maintenance 2.5 mg twice daily. Denies easy bruising or signs of active bleeding.  -Labs today show white blood cell count 5.8, hemoglobin 15.0, platelets 168.  Creatinine and LFTs normal.  Labs adequate for continued Eliquis treatment. -Recommend to hold Eliquis 2 days before planned hip replacement in June 2024 and resume after 24 hours once hemostasis is achieved.  -RTC in 6 months with labs to monitor.   No orders of the defined types were placed in this encounter.   All questions were answered. The patient knows to call the clinic with any problems, questions or concerns.  I have spent a total of 30 minutes minutes of face-to-face and non-face-to-face time, preparing to see the patient, performing a medically appropriate examination, counseling and educating the patient, documenting clinical information in the electronic health record, and care coordination.   Shawn Query PA-C Dept of Hematology and [place] at [facility] Phone: [phone]

## 2022-07-09 ENCOUNTER — Telehealth: Payer: Self-pay | Admitting: Hematology and Oncology

## 2022-07-09 NOTE — Telephone Encounter (Signed)
Per 3/27 LOS reached out to patient to schedule; patient aware of date and time of appointment,

## 2022-07-13 ENCOUNTER — Encounter: Payer: Self-pay | Admitting: Family Medicine

## 2022-07-13 ENCOUNTER — Ambulatory Visit (INDEPENDENT_AMBULATORY_CARE_PROVIDER_SITE_OTHER): Payer: Medicare Other | Admitting: Family Medicine

## 2022-07-13 VITALS — BP 131/70 | HR 52 | Temp 97.6°F | Ht 70.0 in | Wt 199.2 lb

## 2022-07-13 DIAGNOSIS — E038 Other specified hypothyroidism: Secondary | ICD-10-CM | POA: Diagnosis not present

## 2022-07-13 DIAGNOSIS — E7800 Pure hypercholesterolemia, unspecified: Secondary | ICD-10-CM | POA: Diagnosis not present

## 2022-07-13 DIAGNOSIS — I10 Essential (primary) hypertension: Secondary | ICD-10-CM | POA: Diagnosis not present

## 2022-07-13 DIAGNOSIS — Z86711 Personal history of pulmonary embolism: Secondary | ICD-10-CM | POA: Diagnosis not present

## 2022-07-13 DIAGNOSIS — Z01818 Encounter for other preprocedural examination: Secondary | ICD-10-CM

## 2022-07-13 LAB — LIPID PANEL
Cholesterol: 183 mg/dL (ref 0–200)
HDL: 47.4 mg/dL (ref >39.00)
LDL Cholesterol: 115 mg/dL — ABNORMAL HIGH (ref 0–99)
NonHDL: 135.64
Total CHOL/HDL Ratio: 4
Triglycerides: 103 mg/dL (ref 0.0–149.0)
VLDL: 20.6 mg/dL (ref 0.0–40.0)

## 2022-07-13 LAB — T4, FREE: Free T4: 0.98 ng/dL (ref 0.60–1.60)

## 2022-07-13 LAB — TSH: TSH: 5.68 u[IU]/mL — ABNORMAL HIGH (ref 0.35–5.50)

## 2022-07-13 NOTE — Progress Notes (Signed)
Office Note 07/13/2022  CC:  Chief Complaint  Patient presents with   Surgical Clearance    HPI:  Patient is a 77 y.o. male who is here for medical preoperative clearance. He is scheduled for left total hip replacement under spinal anesthesia on 09/22/2022.  He feels well other than chronic left hip pain.  This does impair his mobility such that he cannot exercise much.  He can walk without problem on flat surfaces. He has felt well lately.  Past Medical History:  Diagnosis Date   Acute urinary retention 12/2018   ?BPH   Asymmetrical sensorineural hearing loss 2021   Likely from noise exposure.  [facility] [phone] ENT referral for this dx being relatively new.   Barrett's esophagus    Dr. Collene Mares   Cataract    Chronic renal insufficiency, stage 2 (mild) 2017   Stage II/III (GFR 60 ml/min)   GERD (gastroesophageal reflux disease)    History of adenomatous polyp of colon 09/2007   Polyps at 1st screening colonoscopy; 2014 no polyps.  Rpt 12/29/17: adenomatous polyp-->  Recall 5 yrs.   Hyperlipemia, mixed    Pt intol of welchol; fenofibrate helpful.  Fenofib inc to 145 and atorva 10mg  started 08/23/18-->great lipid panel 11/2018.   Hypertension    Lumbar spondylosis    2023 mild   Osteoarthritis of left hip    2023 moderate   Pulmonary embolism 09/2020   with pulm infarct.  Unprovoked->hosp ref to hem/onc.  Pulm recommended anticoag indefinitely.   Subclinical hypothyroidism    per old PCP records.  TPO ab's elevated mildly 12/2017, with TSH 6.6 and normal T4 and T3==>developing Hashimoto's.    Thrombocytopenia    Hx of, mild/stable with monitoring per old PCP records    Past Surgical History:  Procedure Laterality Date   APPENDECTOMY     CATARACT EXTRACTION Bilateral 04/13/2018   05/14/2018   COLONOSCOPY  12/29/2017   polypectomy --path - adenoma,  Repeat in 5 years   COLONOSCOPY W/ POLYPECTOMY  2014   per Dr. Collene Mares, recall 02/2023    ESOPHAGOGASTRODUODENOSCOPY  2014; 2016; 11/16/16   2018--Barrett's esoph reconfirmed   TRANSTHORACIC ECHOCARDIOGRAM  09/19/2020   (in the setting of acute PE)->EF 70-75%, RV fxn mildly reduced, hyperdynamic LV fxn, normal PA pressure, no valve probs.   UPPER GI ENDOSCOPY  03/03/2019   2020.  03/03/21->small patch Barretts, multiple sessile gastric polyps, normal duodenum. Rpt 3 yrs.    Family History  Problem Relation Age of Onset   Hypertension Mother    Hyperlipidemia Mother    Arthritis Father    Diabetes Paternal Grandmother 2    Social History   Socioeconomic History   Marital status: Married    Spouse name: Not on file   Number of children: Not on file   Years of education: Not on file   Highest education level: Master's degree (e.g., MA, MS, MEng, MEd, MSW, MBA)  Occupational History   Not on file  Tobacco Use   Smoking status: Never   Smokeless tobacco: Never  Vaping Use   Vaping Use: Never used  Substance and Sexual Activity   Alcohol use: No   Drug use: No   Sexual activity: Not on file  Other Topics Concern   Not on file  Social History Narrative   Married, grand-daughter lives with him.   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   Orig from [place] and [place].   No T/A/Ds.   Exercises  3 X/week--walking 2-3 miles.   Social Determinants of Health   Financial Resource Strain: Low Risk  (07/09/2022)   Overall Financial Resource Strain (CARDIA)    Difficulty of Paying Living Expenses: Not hard at all  Food Insecurity: No Food Insecurity (07/09/2022)   Hunger Vital Sign    Worried About Running Out of Food in the Last Year: Never true    Ran Out of Food in the Last Year: Never true  Transportation Needs: No Transportation Needs (07/09/2022)   PRAPARE - Hydrologist (Medical): No    Lack of Transportation (Non-Medical): No  Physical Activity: Insufficiently Active (07/09/2022)   Exercise Vital Sign     Days of Exercise per Week: 6 days    Minutes of Exercise per Session: 20 min  Stress: No Stress Concern Present (07/09/2022)   [place]    Feeling of Stress : Not at all  Social Connections: [place] (07/09/2022)   Social Connection and Isolation Panel [NHANES]    Frequency of Communication with Friends and Family: More than three times a week    Frequency of Social Gatherings with Friends and Family: Twice a week    Attends Religious Services: More than 4 times per year    Active Member of [employer] or Organizations: Yes    Attends Music therapist: More than 4 times per year    Marital Status: Married  Human resources officer Violence: Not At Risk (01/09/2021)   Humiliation, Afraid, Rape, and Kick questionnaire    Fear of Current or Ex-Partner: No    Emotionally Abused: No    Physically Abused: No    Sexually Abused: No    Outpatient Medications Prior to Visit  Medication Sig Dispense Refill   atorvastatin (LIPITOR) 10 MG tablet Take 1 tablet (10 mg total) by mouth daily. 90 tablet 3   ELIQUIS 2.5 MG TABS tablet TAKE 1 TABLET TWICE A DAY (OFFICE VISIT NEEDED FOR FURTHER REFILLS) 180 tablet 3   fluorouracil (EFUDEX) 5 % cream Apply topically.     fosinopril (MONOPRIL) 20 MG tablet Take 1 tablet (20 mg total) by mouth every morning. 90 tablet 1   hydrochlorothiazide (HYDRODIURIL) 25 MG tablet Take 0.5 tablets (12.5 mg total) by mouth daily. 45 tablet 3   omeprazole (PRILOSEC) 40 MG capsule Take 40 mg by mouth daily.     Polyethyl Glycol-Propyl Glycol (SYSTANE OP) Place 1 drop into both eyes as needed (dry eyes).     EPINEPHrine 0.3 mg/0.3 mL IJ SOAJ injection Inject 0.3 mg into the muscle as needed for anaphylaxis. (Patient not taking: Reported on 09/05/2021) 2 each 1   No facility-administered medications prior to visit.    No Known Allergies  Review of Systems  Constitutional:  Negative for appetite  change, chills, fatigue and fever.  HENT:  Negative for congestion, dental problem, ear pain and sore throat.   Eyes:  Negative for discharge, redness and visual disturbance.  Respiratory:  Negative for cough, chest tightness, shortness of breath and wheezing.   Cardiovascular:  Negative for chest pain, palpitations and leg swelling.  Gastrointestinal:  Negative for abdominal pain, blood in stool, diarrhea, nausea and vomiting.  Genitourinary:  Negative for difficulty urinating, dysuria, flank pain, frequency, hematuria and urgency.  Musculoskeletal:  Positive for arthralgias (L hip chronic). Negative for back pain, joint swelling, myalgias and neck stiffness.  Skin:  Negative for pallor and rash.  Neurological:  Negative for  dizziness, speech difficulty, weakness and headaches.  Hematological:  Negative for adenopathy. Does not bruise/bleed easily.  Psychiatric/Behavioral:  Negative for confusion and sleep disturbance. The patient is not nervous/anxious.     PE;    07/13/2022   10:03 AM 07/07/2022   10:25 AM 01/15/2022   10:02 AM  Vitals with BMI  Height 5\' 10"   5\' 10"   Weight 199 lbs 3 oz 202 lbs 10 oz 199 lbs  BMI [id number]  [id number]  Systolic [id number]  Diastolic 70 92 71  Pulse 52 61 57   Gen: Alert, well appearing.  Patient is oriented to person, place, time, and situation. AFFECT: pleasant, lucid thought and speech. ENT: Ears: EACs clear, normal epithelium.  TMs with good light reflex and landmarks bilaterally.  Eyes: no injection, icteris, swelling, or exudate.  EOMI, PERRLA. Nose: no drainage or turbinate edema/swelling.  No injection or focal lesion.  Mouth: lips without lesion/swelling.  Oral mucosa pink and moist.  Dentition intact and without obvious caries or gingival swelling.  Oropharynx without erythema, exudate, or swelling.  Neck: supple/nontender.  No LAD, mass, or TM.  Carotid pulses 2+ bilaterally, without bruits. CV: RRR, no m/r/g.   LUNGS: CTA bilat, nonlabored resps,  good aeration in all lung fields. ABD: soft, NT, ND, BS normal.  No hepatospenomegaly or mass.  No bruits. EXT: no clubbing, cyanosis, or edema.  Skin - no sores or suspicious lesions or rashes or color changes  Pertinent labs:  Lab Results  Component Value Date   TSH 5.75 (H) 12/04/2021   Lab Results  Component Value Date   WBC 5.8 07/07/2022   HGB 15.0 07/07/2022   HCT 44.7 07/07/2022   MCV 82.0 07/07/2022   PLT 168 07/07/2022   Lab Results  Component Value Date   CREATININE 0.93 07/07/2022   BUN 16 07/07/2022   NA 136 07/07/2022   K 4.2 07/07/2022   CL 101 07/07/2022   CO2 30 07/07/2022   Lab Results  Component Value Date   ALT 19 07/07/2022   AST 23 07/07/2022   ALKPHOS 81 07/07/2022   BILITOT 0.7 07/07/2022   Lab Results  Component Value Date   CHOL 144 12/04/2021   Lab Results  Component Value Date   HDL 42.00 12/04/2021   Lab Results  Component Value Date   LDLCALC 87 12/04/2021   Lab Results  Component Value Date   TRIG 76.0 12/04/2021   Lab Results  Component Value Date   CHOLHDL 3 12/04/2021   Lab Results  Component Value Date   PSA 2.48 12/07/2019   PSA 1.84 08/19/2018   PSA 1.86 07/08/2017   ASSESSMENT AND PLAN:   1 preoperative clearance for upcoming left hip replacement. Management of his medical problems are optimized. Continue atorvastatin 10 mg a day, fosinopril 20 mg a day, HCTZ 12.5 mg/day, and Eliquis 2.5 mg twice a day.  Recent CBC and complete metabolic panel were normal. I will check lipid panel today as well as thyroid panel (hx of mild TSH elevation that has been stable). Heme-onc gave medical clearance and recommendations regarding Eliquis on 07/07/2022.  Preventative health care: Vaccines: He declines shingrix and Tdap.  Otherwise all vaccines UTD. Colon ca screening: needs repeat colonoscopy this year--scheduled for 12/27/22. Prostate ca screening: he declines any further PSA screening  An After Visit Summary was  printed and given to the patient.  FOLLOW UP:  Return in about 6 months (around 01/12/2023) for routine chronic illness f/u.  Signed:  Crissie Sickles, MD           07/13/2022

## 2022-07-14 ENCOUNTER — Other Ambulatory Visit: Payer: Self-pay | Admitting: Family Medicine

## 2022-07-14 DIAGNOSIS — E7800 Pure hypercholesterolemia, unspecified: Secondary | ICD-10-CM

## 2022-07-14 LAB — T3: T3, Total: 94 ng/dL (ref 76–181)

## 2022-07-14 MED ORDER — ATORVASTATIN CALCIUM 20 MG PO TABS: 20.0000 mg | ORAL_TABLET | Freq: Every day | ORAL | 1 refills | Status: DC

## 2022-07-22 ENCOUNTER — Other Ambulatory Visit: Payer: Self-pay | Admitting: Family Medicine

## 2022-08-14 ENCOUNTER — Encounter: Payer: Self-pay | Admitting: Family Medicine

## 2022-08-14 NOTE — Telephone Encounter (Signed)
Noted  

## 2022-08-21 DIAGNOSIS — Z872 Personal history of diseases of the skin and subcutaneous tissue: Secondary | ICD-10-CM | POA: Diagnosis not present

## 2022-08-21 DIAGNOSIS — D229 Melanocytic nevi, unspecified: Secondary | ICD-10-CM | POA: Diagnosis not present

## 2022-08-21 DIAGNOSIS — L821 Other seborrheic keratosis: Secondary | ICD-10-CM | POA: Diagnosis not present

## 2022-08-21 DIAGNOSIS — L814 Other melanin hyperpigmentation: Secondary | ICD-10-CM | POA: Diagnosis not present

## 2022-08-21 DIAGNOSIS — L578 Other skin changes due to chronic exposure to nonionizing radiation: Secondary | ICD-10-CM | POA: Diagnosis not present

## 2022-08-21 DIAGNOSIS — D1801 Hemangioma of skin and subcutaneous tissue: Secondary | ICD-10-CM | POA: Diagnosis not present

## 2022-08-21 DIAGNOSIS — L570 Actinic keratosis: Secondary | ICD-10-CM | POA: Diagnosis not present

## 2022-08-26 ENCOUNTER — Encounter: Payer: Self-pay | Admitting: Family Medicine

## 2022-08-29 ENCOUNTER — Other Ambulatory Visit: Payer: Self-pay | Admitting: Family Medicine

## 2022-09-01 NOTE — Progress Notes (Signed)
Surgery orders requested via Epic inbox. °

## 2022-09-02 DIAGNOSIS — M1612 Unilateral primary osteoarthritis, left hip: Secondary | ICD-10-CM | POA: Diagnosis not present

## 2022-09-07 NOTE — Progress Notes (Signed)
COVID Vaccine received:  []  No [x]  Yes Date of any COVID positive Test in last 90 days:  PCP - Nicoletta Ba, MD   clearance in 07-13-2022  Epic Note Cardiologist - None Hematology- [name], PA-C at [facility],  clearance   Chest x-ray - 09-18-2020  2v  Epic  EKG - 09-18-2020  Epic  Stress Test -  ECHO - TTE  09-19-2020  Epic Cardiac Cath -   PCR screen: [x]  Ordered & Completed           []   No Order but Needs PROFEND           []   N/A for this surgery  Surgery Plan:  []  Ambulatory                            [x]  Outpatient in bed                            []  Admit  Anesthesia:    []  General  [x]  Spinal                           []   Choice []   MAC  Pacemaker / ICD device [x]  No []  Yes   Spinal Cord Stimulator:[x]  No []  Yes       History of Sleep Apnea? [x]  No []  Yes   CPAP used?- [x]  No []  Yes    Does the patient monitor blood sugar?          []  No []  Yes  [x]  N/A  Patient has: [x]  NO Hx DM   []  Pre-DM                 []  DM1  []   DM2 Does patient have a [employer] or Dexacom? []  No []  Yes   Fasting Blood Sugar Ranges-  Checks Blood Sugar _____ times a day  Blood Thinner / Instructions:ELIQUIS   Hold x 2 days per [name], PA-C 07-07-22 note Aspirin Instructions:  None  ERAS Protocol Ordered: []  No  [x]  Yes PRE-SURGERY [x]  ENSURE  []  G2  Patient is to be NPO after: 04:30  am  Patient was given the 5 CHG shower / bath instructions for THA / TKA / Total or Reverse Shoulder arthroplasty surgery along with 2 bottles of the CHG soap. Patient will start this on: FRIDAY 09-18-2022  All questions were asked and answered, Patient voiced understanding of this process.   Activity level: Patient is able / unable to climb a flight of stairs without difficulty; []  No CP  []  No SOB, but would have ___   Patient can / can not perform ADLs without assistance.   Anesthesia review: HTN, GERD, Hx PE- unprovoked w/ pulmonary infarct. CKD2, Thrombocytopenia.   Patient denies shortness of  breath, fever, cough and chest pain at PAT appointment.  Patient verbalized understanding and agreement to the Pre-Surgical Instructions that were given to them at this PAT appointment. Patient was also educated of the need to review these PAT instructions again prior to his surgery.I reviewed the appropriate phone numbers to call if they have any and questions or concerns.

## 2022-09-07 NOTE — Patient Instructions (Signed)
SURGICAL WAITING ROOM VISITATION Patients having surgery or a procedure may have no more than 2 support people in the waiting area - these visitors may rotate in the visitor waiting room.   Due to an increase in RSV and influenza rates and associated hospitalizations, children ages 19 and under may not visit patients in [facility] hospitals. If the patient needs to stay at the hospital during part of their recovery, the visitor guidelines for inpatient rooms apply.  PRE-OP VISITATION  Pre-op nurse will coordinate an appropriate time for 1 support person to accompany the patient in pre-op.  This support person may not rotate.  This visitor will be contacted when the time is appropriate for the visitor to come back in the pre-op area.  Please refer to the [facility] website for the visitor guidelines for Inpatients (after your surgery is over and you are in a regular room).  You are not required to quarantine at this time prior to your surgery. However, you must do this: Hand Hygiene often Do NOT share personal items Notify your provider if you are in close contact with someone who has COVID or you develop fever 100.4 or greater, new onset of sneezing, cough, sore throat, shortness of breath or body aches.  If you test positive for Covid or have been in contact with anyone that has tested positive in the last 10 days please notify you surgeon.    Your procedure is scheduled on: Tuesday   September 22, 2022  Report to [facility] Main Entrance: [place] entrance where the [employer] is available.   Report to admitting at :    05:15   AM  +++++Call this number if you have any questions or problems the morning of surgery [phone]  Do not eat food after Midnight the night prior to your surgery/procedure.  After Midnight you may have the following liquids until   04:30 AM DAY OF SURGERY  Clear Liquid Diet Water Black Coffee (sugar ok, NO MILK/CREAM OR CREAMERS)  Tea (sugar ok,  NO MILK/CREAM OR CREAMERS) regular and decaf                             Plain Jell-O  with no fruit (NO RED)                                           Fruit ices (not with fruit pulp, NO RED)                                     Popsicles (NO RED)                                                                  Juice: apple, WHITE grape, WHITE cranberry Sports drinks like Gatorade or Powerade (NO RED)                    The day of surgery:  Drink ONE (1) Pre-Surgery Clear Ensure at  04:30 AM the morning of surgery.  Drink in one sitting. Do not sip.  This drink was given to you during your hospital pre-op appointment visit. Nothing else to drink after completing the Pre-Surgery Clear Ensure  : No candy, chewing gum or throat lozenges.    FOLLOW  ANY ADDITIONAL PRE OP INSTRUCTIONS YOU RECEIVED FROM YOUR SURGEON'S OFFICE!!!   Oral Hygiene is also important to reduce your risk of infection.        Remember - BRUSH YOUR TEETH THE MORNING OF SURGERY WITH YOUR REGULAR TOOTHPASTE  Do NOT smoke after Midnight the night before surgery.  Take ONLY these medicines the morning of surgery with A SIP OF WATER: Omeprazole (Prilosec),  You may use your Systane Eye Drops if needed.   You may not have any metal on your body including jewelry, and body piercing  Do not wear , lotions, powders, cologne, or deodorant   Men may shave face and neck.  Contacts, Hearing Aids, dentures or bridgework may not be worn into surgery. DENTURES WILL BE REMOVED PRIOR TO SURGERY PLEASE DO NOT APPLY "Poly grip" OR ADHESIVES!!!  You may bring a small overnight bag with you on the day of surgery, only pack items that are not valuable. [facility] IS NOT RESPONSIBLE   FOR VALUABLES THAT ARE LOST OR STOLEN.   Do not bring your home medications to the hospital. The Pharmacy will dispense medications listed on your medication list to you during your admission in the Hospital.  Special Instructions: Bring a copy of your  healthcare power of attorney and living will documents the day of surgery, if you wish to have them scanned into your  Medical Records- EPIC  Please read over the following fact sheets you were given: IF YOU HAVE QUESTIONS ABOUT YOUR PRE-OP INSTRUCTIONS, PLEASE CALL [phone].        Pre-operative 5 CHG Bath Instructions   You can play a key role in reducing the risk of infection after surgery. Your skin needs to be as free of germs as possible. You can reduce the number of germs on your skin by washing with CHG (chlorhexidine gluconate) soap before surgery. CHG is an antiseptic soap that kills germs and continues to kill germs even after washing.   DO NOT use if you have an allergy to chlorhexidine/CHG or antibacterial soaps. If your skin becomes reddened or irritated, stop using the CHG and notify one of our RNs at [phone]  Please shower with the CHG soap starting 4 days before surgery using the following schedule: START SHOWERS ON FRIDAY  September 18, 2022  Please keep in mind the following:  DO NOT shave, including legs and underarms, starting the day of your first shower.   You may shave your face at any point before/day of surgery.   Place clean sheets on your bed the day you start using CHG soap. Use a clean washcloth (not used since being washed) for each shower. DO NOT sleep with pets once you start using the CHG.   CHG Shower Instructions:  If you choose to wash your hair and private area, wash first with your normal shampoo/soap.  After you use shampoo/soap, rinse your hair and body thoroughly to remove shampoo/soap residue.  Turn the water OFF and apply about 3 tablespoons (45 ml) of CHG soap to a CLEAN washcloth.  Apply CHG soap ONLY FROM YOUR NECK DOWN TO YOUR TOES (washing for 3-5  minutes)  DO NOT use CHG soap on face, private areas, open wounds, or sores.  Pay special attention to the area where your surgery is being performed.  If you are having back surgery, having someone wash your back for you may be helpful.  Wait 2 minutes after CHG soap is applied, then you may rinse off the CHG soap.  Pat dry with a clean towel  Put on clean clothes/pajamas   If you choose to wear lotion, please use ONLY the CHG-compatible lotions on the back of this paper.     Additional instructions for the day of surgery: DO NOT APPLY any lotions, deodorants, cologne, or perfumes.   Put on clean/comfortable clothes.  Brush your teeth.  Ask your nurse before applying any prescription medications to the skin.      CHG Compatible Lotions   Aveeno Moisturizing lotion  Cetaphil Moisturizing Cream  Cetaphil Moisturizing Lotion  Clairol Herbal Essence Moisturizing Lotion, Dry Skin  Clairol Herbal Essence Moisturizing Lotion, Extra Dry Skin  Clairol Herbal Essence Moisturizing Lotion, Normal Skin  Curel Age Defying Therapeutic Moisturizing Lotion with Alpha Hydroxy  Curel Extreme Care Body Lotion  Curel Soothing Hands Moisturizing Hand Lotion  Curel Therapeutic Moisturizing Cream, Fragrance-Free  Curel Therapeutic Moisturizing Lotion, Fragrance-Free  Curel Therapeutic Moisturizing Lotion, Original Formula  Eucerin Daily Replenishing Lotion  Eucerin Dry Skin Therapy Plus Alpha Hydroxy Crme  Eucerin Dry Skin Therapy Plus Alpha Hydroxy Lotion  Eucerin Original Crme  Eucerin Original Lotion  Eucerin Plus Crme Eucerin Plus Lotion  Eucerin TriLipid Replenishing Lotion  Keri Anti-Bacterial Hand Lotion  Keri Deep Conditioning Original Lotion Dry Skin Formula Softly Scented  Keri Deep Conditioning Original Lotion, Fragrance Free Sensitive Skin Formula  Keri Lotion Fast Absorbing Fragrance Free Sensitive Skin Formula  Keri Lotion Fast Absorbing Softly Scented Dry Skin Formula  Keri  Original Lotion  Keri Skin Renewal Lotion Keri Silky Smooth Lotion  Keri Silky Smooth Sensitive Skin Lotion  Nivea Body Creamy Conditioning Oil  Nivea Body Extra Enriched Lotion  Nivea Body Original Lotion  Nivea Body Sheer Moisturizing Lotion Nivea Crme  Nivea Skin Firming Lotion  NutraDerm 30 Skin Lotion  NutraDerm Skin Lotion  NutraDerm Therapeutic Skin Cream  NutraDerm Therapeutic Skin Lotion  ProShield Protective Hand Cream  Provon moisturizing lotion  ON THE DAY OF SURGERY : Do not apply any lotions/deodorants the morning of surgery.  Please wear clean clothes to the hospital/surgery center.    FAILURE TO FOLLOW THESE INSTRUCTIONS MAY RESULT IN THE CANCELLATION OF YOUR SURGERY  PATIENT SIGNATURE_________________________________  NURSE SIGNATURE__________________________________  ________________________________________________________________________        Rogelia Mire    An incentive  spirometer is a tool that can help keep your lungs clear and active. This tool measures how well you are filling your lungs with each breath. Taking long deep breaths may help reverse or decrease the chance of developing breathing (pulmonary) problems (especially infection) following: A long period of time when you are unable to move or be active. BEFORE THE PROCEDURE  If the spirometer includes an indicator to show your best effort, your nurse or respiratory therapist will set it to a desired goal. If possible, sit up straight or lean slightly forward. Try not to slouch. Hold the incentive spirometer in an upright position. INSTRUCTIONS FOR USE  Sit on the edge of your bed if possible, or sit up as far as you can in bed or on a chair. Hold the incentive spirometer in an upright position. Breathe out normally. Place the mouthpiece in your mouth and seal your lips tightly around it. Breathe in slowly and as deeply as possible, raising the piston or the ball toward the top  of the column. Hold your breath for 3-5 seconds or for as long as possible. Allow the piston or ball to fall to the bottom of the column. Remove the mouthpiece from your mouth and breathe out normally. Rest for a few seconds and repeat Steps 1 through 7 at least 10 times every 1-2 hours when you are awake. Take your time and take a few normal breaths between deep breaths. The spirometer may include an indicator to show your best effort. Use the indicator as a goal to work toward during each repetition. After each set of 10 deep breaths, practice coughing to be sure your lungs are clear. If you have an incision (the cut made at the time of surgery), support your incision when coughing by placing a pillow or rolled up towels firmly against it. Once you are able to get out of bed, walk around indoors and cough well. You may stop using the incentive spirometer when instructed by your caregiver.  RISKS AND COMPLICATIONS Take your time so you do not get dizzy or light-headed. If you are in pain, you may need to take or ask for pain medication before doing incentive spirometry. It is harder to take a deep breath if you are having pain. AFTER USE Rest and breathe slowly and easily. It can be helpful to keep track of a log of your progress. Your caregiver can provide you with a simple table to help with this. If you are using the spirometer at home, follow these instructions: SEEK MEDICAL CARE IF:  You are having difficultly using the spirometer. You have trouble using the spirometer as often as instructed. Your pain medication is not giving enough relief while using the spirometer. You develop fever of 100.5 F (38.1 C) or higher.                                                                                                    SEEK IMMEDIATE MEDICAL CARE IF:  You cough up bloody sputum that had not been present before. You develop fever of 102  F (38.9 C) or greater. You develop worsening pain at or  near the incision site. MAKE SURE YOU:  Understand these instructions. Will watch your condition. Will get help right away if you are not doing well or get worse. Document Released: 08/10/2006 Document Revised: 06/22/2011 Document Reviewed: 10/11/2006 [facility] Patient Information 2014 [place], [place].

## 2022-09-09 ENCOUNTER — Encounter (HOSPITAL_COMMUNITY)
Admission: RE | Admit: 2022-09-09 | Discharge: 2022-09-09 | Disposition: A | Discharge status: Home / Self Care | Payer: Medicare Other | Source: Ambulatory Visit | Attending: Orthopedic Surgery | Admitting: Orthopedic Surgery

## 2022-09-09 ENCOUNTER — Other Ambulatory Visit: Payer: Self-pay

## 2022-09-09 VITALS — BP 137/88 | HR 64 | Temp 97.6°F | Resp 16 | Ht 70.0 in | Wt 194.0 lb

## 2022-09-09 DIAGNOSIS — I10 Essential (primary) hypertension: Secondary | ICD-10-CM | POA: Diagnosis not present

## 2022-09-09 DIAGNOSIS — Z01818 Encounter for other preprocedural examination: Secondary | ICD-10-CM | POA: Diagnosis not present

## 2022-09-09 DIAGNOSIS — R001 Bradycardia, unspecified: Secondary | ICD-10-CM | POA: Diagnosis not present

## 2022-09-09 LAB — SURGICAL PCR SCREEN
MRSA, PCR: NEGATIVE
Staphylococcus aureus: NEGATIVE

## 2022-09-09 LAB — CBC
HCT: 43.1 % (ref 39.0–52.0)
Hemoglobin: 14.3 g/dL (ref 13.0–17.0)
MCH: 27.6 pg (ref 26.0–34.0)
MCHC: 33.2 g/dL (ref 30.0–36.0)
MCV: 83.2 fL (ref 80.0–100.0)
Platelets: 166 10*3/uL (ref 150–400)
RBC: 5.18 MIL/uL (ref 4.22–5.81)
RDW: 15.2 % (ref 11.5–15.5)
WBC: 5.6 10*3/uL (ref 4.0–10.5)
nRBC: 0 % (ref 0.0–0.2)

## 2022-09-09 LAB — BASIC METABOLIC PANEL
Anion gap: 4 — ABNORMAL LOW (ref 5–15)
BUN: 16 mg/dL (ref 8–23)
CO2: 24 mmol/L (ref 22–32)
Calcium: 9.1 mg/dL (ref 8.9–10.3)
Chloride: 105 mmol/L (ref 98–111)
Creatinine, Ser: 0.86 mg/dL (ref 0.61–1.24)
GFR, Estimated: >60 mL/min (ref >60)
Glucose, Bld: 96 mg/dL (ref 70–99)
Potassium: 3.8 mmol/L (ref 3.5–5.1)
Sodium: 133 mmol/L — ABNORMAL LOW (ref 135–145)

## 2022-09-20 NOTE — Care Plan (Signed)
Ortho Bundle Case Management Note  Patient Details  Name: Shawn [name] MRN: [id number] Date of Birth: June 25, 1945   spoke with patient on phone. He will discharge to home with family. OPPT set up with [facility]. no DME needed. discharge instructions discussed and mailed.                     DME Arranged:    DME Agency:     HH Arranged:    HH Agency:     Additional Comments: Please contact me with any questions of if this plan should need to change.  Shauna Hugh,  RN,BSN,MHA,CCM  [facility] Orthopaedic Specialist  [phone] 09/20/2022, 10:18 PM

## 2022-09-21 NOTE — Anesthesia Preprocedure Evaluation (Signed)
Anesthesia Evaluation  Patient identified by MRN, date of birth, ID band Patient awake    Reviewed: Allergy & Precautions, NPO status , Patient's Chart, lab work & pertinent test results  History of Anesthesia Complications Negative for: history of anesthetic complications  Airway Mallampati: III  TM Distance: >3 FB Neck ROM: Full    Dental  (+) Dental Advisory Given   Pulmonary neg shortness of breath, neg sleep apnea, neg COPD, neg recent URI, PE (09/2020)   Pulmonary exam normal breath sounds clear to auscultation       Cardiovascular hypertension (fosinopril, HCTZ), Pt. on medications (-) angina (-) Past MI, (-) Cardiac Stents and (-) CABG (-) dysrhythmias  Rhythm:Regular Rate:Normal  HLD  TTE 09/19/2020: IMPRESSIONS     1. Left ventricular ejection fraction, by estimation, is 70 to 75%. The  left ventricle has hyperdynamic function. The left ventricle has no  regional wall motion abnormalities. Left ventricular diastolic parameters  were normal.   2. Right ventricular systolic function is mildly reduced. The right  ventricular size is normal. There is normal pulmonary artery systolic  pressure.   3. Trivial mitral valve regurgitation.   4. The aortic valve is tricuspid. Aortic valve regurgitation is not  visualized. Mild aortic valve sclerosis is present, with no evidence of  aortic valve stenosis.   5. The inferior vena cava is normal in size with greater than 50%  respiratory variability, suggesting right atrial pressure of 3 mmHg.     Neuro/Psych neg Seizures  Neuromuscular disease (mild lumbar spondylosis)    GI/Hepatic Neg liver ROS,GERD (Barrett's esophagus)  Medicated,,  Endo/Other  neg diabetesHypothyroidism    Renal/GU CRFRenal disease     Musculoskeletal  (+) Arthritis , Osteoarthritis,    Abdominal   Peds  Hematology negative hematology ROS (+)   Anesthesia Other Findings Last Eliquis:  09/18/2022 (evening)  Platelets 166 on 09/09/2022  Reproductive/Obstetrics                             Anesthesia Physical Anesthesia Plan  ASA: 3  Anesthesia Plan: Spinal and MAC   Post-op Pain Management: Tylenol PO (pre-op)*   Induction: Intravenous  PONV Risk Score and Plan: 1 and Propofol infusion, Dexamethasone, Ondansetron and Treatment may vary due to age or medical condition  Airway Management Planned: Natural Airway and Simple Face Mask  Additional Equipment:   Intra-op Plan:   Post-operative Plan:   Informed Consent: I have reviewed the patients History and Physical, chart, labs and discussed the procedure including the risks, benefits and alternatives for the proposed anesthesia with the patient or authorized representative who has indicated his/her understanding and acceptance.     Dental advisory given  Plan Discussed with: CRNA and Anesthesiologist  Anesthesia Plan Comments: (I have discussed risks of neuraxial anesthesia including but not limited to infection, bleeding, nerve injury, back pain, headache, seizures, and failure of block. Patient denies bleeding disorders and is not currently anticoagulated. Labs have been reviewed. Risks and benefits discussed. All patient's questions answered.   Discussed with patient risks of MAC including, but not limited to, minor pain or discomfort, hearing people in the room, and possible need for backup general anesthesia. Risks for general anesthesia also discussed including, but not limited to, sore throat, hoarse voice, chipped/damaged teeth, injury to vocal cords, nausea and vomiting, allergic reactions, lung infection, heart attack, stroke, and death. All questions answered. )       Anesthesia Quick  Evaluation

## 2022-09-21 NOTE — H&P (Signed)
HIP ARTHROPLASTY ADMISSION H&P  Patient ID: Shawn [name] MRN: [id number] DOB/AGE: Dec 05, 1945 77 y.o.  Chief Complaint: left hip pain.  Planned Procedure Date: 09/22/22 Medical Clearance by Dr. Milinda Cave   HPI: Shawn [name] is a 77 y.o. male who presents for evaluation of left hip OA. The patient has a history of pain and functional disability in the left hip due to arthritis and has failed non-surgical conservative treatments for greater than 12 weeks to include NSAID's and/or analgesics and activity modification.  Onset of symptoms was gradual, starting 3 years ago with gradually worsening course since that time. The patient noted no past surgery on the left hip.  Patient currently rates pain at 2 out of 10 with activity. Patient has worsening of pain with activity and weight bearing and pain that interferes with activities of daily living.  Patient has evidence of joint space narrowing by imaging studies.  There is no active infection.  Past Medical History:  Diagnosis Date   Acute urinary retention 12/2018   ?BPH   Asymmetrical sensorineural hearing loss 2021   Likely from noise exposure.  [facility] [phone] ENT referral for this dx being relatively new.   Barrett's esophagus    Dr. Loreta Ave   Cataract    Chronic renal insufficiency, stage 2 (mild) 2017   Stage II/III (GFR 60 ml/min)   GERD (gastroesophageal reflux disease)    History of adenomatous polyp of colon 09/2007   Polyps at 1st screening colonoscopy; 2014 no polyps.  Rpt 12/29/17: adenomatous polyp-->  Recall 5 yrs.   Hyperlipemia, mixed    Pt intol of welchol; fenofibrate helpful.  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  Unprovoked->hosp ref to hem/onc.  Pulm recommended anticoag  indefinitely.   Subclinical hypothyroidism    per old PCP records.  TPO ab's elevated mildly 12/2017, with TSH 6.6 and normal T4 and T3==>developing Hashimoto's.    Thrombocytopenia (HCC)    Hx of, mild/stable with monitoring per old PCP records   Past Surgical History:  Procedure Laterality Date   APPENDECTOMY     CATARACT EXTRACTION Bilateral 04/13/2018   05/14/2018   COLONOSCOPY  12/29/2017   polypectomy --path - adenoma,  Repeat in 5 years   COLONOSCOPY W/ POLYPECTOMY  2014   per Dr. Loreta Ave, recall 02/2023   ESOPHAGOGASTRODUODENOSCOPY  2014; 2016; 11/16/16   2018--Barrett's esoph reconfirmed   TRANSTHORACIC ECHOCARDIOGRAM  09/19/2020   (in the setting of acute PE)->EF 70-75%, RV fxn mildly reduced, hyperdynamic LV fxn, normal PA pressure, no valve probs.   UPPER GI ENDOSCOPY  03/03/2019   2020.  03/03/21->small patch Barretts, multiple sessile gastric polyps, normal duodenum. Rpt 3 yrs.   No Known Allergies Prior to Admission medications   Medication Sig Start Date End Date Taking? Authorizing Provider  atorvastatin (LIPITOR) 20 MG tablet Take 1 tablet (20 mg total) by mouth daily. Patient taking differently: Take 10 mg by mouth daily. 07/14/22  Yes McGowen, Maryjean Morn, MD  ELIQUIS 2.5 MG TABS tablet TAKE 1 TABLET TWICE A DAY (OFFICE VISIT NEEDED FOR FURTHER REFILLS) 06/18/22  Yes McGowen, Maryjean Morn, MD  EPINEPHrine 0.3 mg/0.3 mL IJ SOAJ injection Inject 0.3 mg into the muscle as needed for anaphylaxis.  06/27/21  Yes McGowen, Maryjean Morn, MD  fosinopril (MONOPRIL) 20 MG tablet TAKE 1 TABLET EVERY MORNING 07/22/22  Yes McGowen, Maryjean Morn, MD  hydrochlorothiazide (HYDRODIURIL) 25 MG tablet Take 0.5 tablets (12.5 mg total) by mouth daily. 12/04/21  Yes McGowen, Maryjean Morn, MD  omeprazole (PRILOSEC) 40 MG capsule Take 40 mg by mouth daily. 06/13/16  Yes [provider]  Polyethyl Glycol-Propyl Glycol (SYSTANE OP) Place 1 drop into both eyes as needed (dry eyes).   Yes [provider]    Social History   Socioeconomic History   Marital status: Married    Spouse name: Not on file   Number of children: Not on file   Years of education: Not on file   Highest education level: Master's degree (e.g., MA, MS, MEng, MEd, MSW, MBA)  Occupational History   Not on file  Tobacco Use   Smoking status: Never   Smokeless tobacco: Never  Vaping Use   Vaping Use: Never used  Substance and Sexual Activity   Alcohol use: No   Drug use: No   Sexual activity: Not on file  Other Topics Concern   Not on file  Social History Narrative   Married, grand-daughter lives with him.   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   Orig from [place] and [place].   No T/A/Ds.   Exercises 3 X/week--walking 2-3 miles.   Social Determinants of Health   Financial Resource Strain: Low Risk  (07/09/2022)   Overall Financial Resource Strain (CARDIA)    Difficulty of Paying Living Expenses: Not hard at all  Food Insecurity: No Food Insecurity (07/09/2022)   Hunger Vital Sign    Worried About Running Out of Food in the Last Year: Never true    Ran Out of Food in the Last Year: Never true  Transportation Needs: No Transportation Needs (07/09/2022)   PRAPARE - Administrator, Civil Service (Medical): No    Lack of Transportation (Non-Medical): No  Physical Activity: Insufficiently Active (07/09/2022)   Exercise Vital Sign    Days of Exercise per Week: 6 days    Minutes of Exercise per Session: 20 min  Stress: No Stress Concern Present (07/09/2022)   [employer] of Occupational Health - Occupational Stress Questionnaire    Feeling of Stress : Not at all  Social Connections: Socially Integrated (07/09/2022)   Social Connection and Isolation Panel [NHANES]    Frequency of Communication with Friends and Family: More than three times a week    Frequency of Social Gatherings with Friends and Family: Twice a week    Attends Religious Services: More than 4  times per year    Active Member of [employer] or Organizations: Yes    Attends Engineer, structural: More than 4 times per year    Marital Status: Married   Family History  Problem Relation Age of Onset   Hypertension Mother    Hyperlipidemia Mother    Arthritis Father    Diabetes Paternal Grandmother 35    ROS: Currently denies lightheadedness, dizziness, Fever, chills, CP, SOB.   No personal history of DVT, MI, or CVA.Hx of PE. No loose teeth or dentures All other systems have been reviewed and were otherwise currently negative with the exception of those mentioned in the HPI and as above.  Objective: Vitals: HT: 5 feet, 9  inches; WT: 198.8; TEMP: 98.4; BP: 144/82; PULSE: 61; O2: 95% on room air. Physical Exam: General: Alert, NAD. Trendelenberg  Gait  HEENT: EOMI, Good Neck Extension  Pulm: No increased work of breathing.  Clear B/L A/P w/o crackle or wheeze.  CV: RRR, No m/g/r appreciated  GI: soft, NT, ND Neuro: Neuro without gross focal deficit.  Sensation intact distally Skin: No lesions in the area of chief complaint MSK/Surgical Site: left Hip pain with passive ROM.  Positive Stinchfield.  5/5 strength.  NVI.    Imaging Review Plain radiographs and MRI demonstrate severe degenerative joint disease of the left hip.   The bone quality appears to be adequate for age and reported activity level.  Preoperative templating of the joint replacement has been completed, documented, and submitted to the Operating Room personnel in order to optimize intra-operative equipment management.  Assessment: left hip OA    Plan: Plan for Procedure(s): TOTAL HIP ARTHROPLASTY  The patient history, physical exam, clinical judgement of the provider and imaging are consistent with end stage degenerative joint disease and total joint arthroplasty is deemed medically necessary. The treatment options including medical management, injection therapy, and arthroplasty were discussed at  length. The risks and benefits of Procedure(s): TOTAL HIP ARTHROPLASTY were presented and reviewed.  The risks of nonoperative treatment, versus surgical intervention including but not limited to continued pain, aseptic loosening, stiffness, dislocation/subluxation, infection, bleeding, nerve injury, blood clots, cardiopulmonary complications, morbidity, mortality, among others were discussed. The patient verbalizes understanding and wishes to proceed with the plan.  Patient is being admitted for surgery, pain control, PT, prophylactic antibiotics, VTE prophylaxis, progressive ambulation, ADL's and discharge planning.   The patient does not meet the criteria for TXA which will be used perioperatively.   Home Eliquis will be used postoperatively for DVT prophylaxis in addition to SCDs, and early ambulation. The patient is planning to be discharged home with OPPT in care of wife   Annita Brod 09/21/2022 12:13 PM

## 2022-09-22 ENCOUNTER — Other Ambulatory Visit: Payer: Self-pay

## 2022-09-22 ENCOUNTER — Encounter (HOSPITAL_COMMUNITY): Payer: Self-pay | Admitting: Orthopedic Surgery

## 2022-09-22 ENCOUNTER — Ambulatory Visit (HOSPITAL_COMMUNITY): Payer: Medicare Other

## 2022-09-22 ENCOUNTER — Ambulatory Visit (HOSPITAL_COMMUNITY): Payer: Medicare Other | Admitting: Physician Assistant

## 2022-09-22 ENCOUNTER — Observation Stay (HOSPITAL_COMMUNITY)
Admission: RE | Admit: 2022-09-22 | Discharge: 2022-09-23 | Disposition: A | Discharge status: Home / Self Care | Payer: Medicare Other | Source: Ambulatory Visit | Attending: Orthopedic Surgery | Admitting: Orthopedic Surgery

## 2022-09-22 ENCOUNTER — Ambulatory Visit (HOSPITAL_BASED_OUTPATIENT_CLINIC_OR_DEPARTMENT_OTHER): Payer: Medicare Other | Admitting: Anesthesiology

## 2022-09-22 ENCOUNTER — Encounter (HOSPITAL_COMMUNITY)
Admission: RE | Disposition: A | Discharge status: Home / Self Care | Payer: Self-pay | Source: Ambulatory Visit | Attending: Orthopedic Surgery

## 2022-09-22 DIAGNOSIS — Z96642 Presence of left artificial hip joint: Secondary | ICD-10-CM | POA: Diagnosis present

## 2022-09-22 DIAGNOSIS — M1612 Unilateral primary osteoarthritis, left hip: Principal | ICD-10-CM | POA: Insufficient documentation

## 2022-09-22 DIAGNOSIS — N182 Chronic kidney disease, stage 2 (mild): Secondary | ICD-10-CM | POA: Insufficient documentation

## 2022-09-22 DIAGNOSIS — Z86711 Personal history of pulmonary embolism: Secondary | ICD-10-CM | POA: Diagnosis not present

## 2022-09-22 DIAGNOSIS — E039 Hypothyroidism, unspecified: Secondary | ICD-10-CM | POA: Insufficient documentation

## 2022-09-22 DIAGNOSIS — N189 Chronic kidney disease, unspecified: Secondary | ICD-10-CM | POA: Diagnosis not present

## 2022-09-22 DIAGNOSIS — I129 Hypertensive chronic kidney disease with stage 1 through stage 4 chronic kidney disease, or unspecified chronic kidney disease: Secondary | ICD-10-CM | POA: Insufficient documentation

## 2022-09-22 DIAGNOSIS — Z7901 Long term (current) use of anticoagulants: Secondary | ICD-10-CM | POA: Insufficient documentation

## 2022-09-22 DIAGNOSIS — Z79899 Other long term (current) drug therapy: Secondary | ICD-10-CM | POA: Insufficient documentation

## 2022-09-22 DIAGNOSIS — Z471 Aftercare following joint replacement surgery: Secondary | ICD-10-CM | POA: Diagnosis not present

## 2022-09-22 DIAGNOSIS — E785 Hyperlipidemia, unspecified: Secondary | ICD-10-CM | POA: Diagnosis not present

## 2022-09-22 HISTORY — PX: TOTAL HIP ARTHROPLASTY: SHX124

## 2022-09-22 SURGERY — ARTHROPLASTY, HIP, TOTAL,POSTERIOR APPROACH
Anesthesia: Monitor Anesthesia Care | Site: Hip | Laterality: Left

## 2022-09-22 MED ORDER — ACETAMINOPHEN 500 MG PO TABS
1000.0000 mg | ORAL_TABLET | Freq: Once | ORAL | Status: AC
  Administered 2022-09-22: 1000 mg via ORAL
  Filled 2022-09-22: qty 2

## 2022-09-22 MED ORDER — EPHEDRINE SULFATE (PRESSORS) 50 MG/ML IJ SOLN
INTRAMUSCULAR | Status: DC | PRN
  Administered 2022-09-22: 7.5 mg via INTRAVENOUS

## 2022-09-22 MED ORDER — BUPIVACAINE IN DEXTROSE 0.75-8.25 % IT SOLN
INTRATHECAL | Status: DC | PRN
  Administered 2022-09-22: 1.8 mL via INTRATHECAL

## 2022-09-22 MED ORDER — ONDANSETRON HCL 4 MG PO TABS: 4.0000 mg | ORAL_TABLET | Freq: Four times a day (QID) | ORAL | Status: DC | PRN

## 2022-09-22 MED ORDER — CEFAZOLIN SODIUM-DEXTROSE 2-4 GM/100ML-% IV SOLN
2.0000 g | INTRAVENOUS | Status: AC
  Administered 2022-09-22: 2 g via INTRAVENOUS
  Filled 2022-09-22: qty 100

## 2022-09-22 MED ORDER — 0.9 % SODIUM CHLORIDE (POUR BTL) OPTIME
TOPICAL | Status: DC | PRN
  Administered 2022-09-22: 1000 mL

## 2022-09-22 MED ORDER — DOCUSATE SODIUM 100 MG PO CAPS
100.0000 mg | ORAL_CAPSULE | Freq: Two times a day (BID) | ORAL | Status: DC
  Administered 2022-09-22: 100 mg via ORAL
  Filled 2022-09-22 (×2): qty 1

## 2022-09-22 MED ORDER — STERILE WATER FOR IRRIGATION IR SOLN
Status: DC | PRN
  Administered 2022-09-22: 2000 mL

## 2022-09-22 MED ORDER — MORPHINE SULFATE (PF) 2 MG/ML IV SOLN: 0.5000 mg | INTRAVENOUS | Status: DC | PRN

## 2022-09-22 MED ORDER — PROPOFOL 500 MG/50ML IV EMUL
INTRAVENOUS | Status: DC | PRN
  Administered 2022-09-22: 100 ug/kg/min via INTRAVENOUS

## 2022-09-22 MED ORDER — POLYVINYL ALCOHOL 1.4 % OP SOLN: 1.0000 [drp] | OPHTHALMIC | Status: DC | PRN

## 2022-09-22 MED ORDER — ACETAMINOPHEN 500 MG PO TABS: 1000.0000 mg | ORAL_TABLET | Freq: Once | ORAL | Status: DC

## 2022-09-22 MED ORDER — POLYETHYLENE GLYCOL 3350 17 G PO PACK: 17.0000 g | PACK | Freq: Every day | ORAL | Status: DC | PRN

## 2022-09-22 MED ORDER — METHOCARBAMOL 1000 MG/10ML IJ SOLN: 500.0000 mg | Freq: Four times a day (QID) | INTRAVENOUS | Status: DC | PRN

## 2022-09-22 MED ORDER — AMISULPRIDE (ANTIEMETIC) 5 MG/2ML IV SOLN: 10.0000 mg | Freq: Once | INTRAVENOUS | Status: DC | PRN

## 2022-09-22 MED ORDER — PANTOPRAZOLE SODIUM 40 MG PO TBEC
80.0000 mg | DELAYED_RELEASE_TABLET | Freq: Every day | ORAL | Status: DC
  Administered 2022-09-22: 80 mg via ORAL
  Filled 2022-09-22 (×2): qty 2

## 2022-09-22 MED ORDER — POVIDONE-IODINE 10 % EX SWAB: 2.0000 | Freq: Once | CUTANEOUS | Status: DC

## 2022-09-22 MED ORDER — HYDROCODONE-ACETAMINOPHEN 5-325 MG PO TABS: 1.0000 | ORAL_TABLET | ORAL | Status: DC | PRN

## 2022-09-22 MED ORDER — LISINOPRIL 20 MG PO TABS
20.0000 mg | ORAL_TABLET | Freq: Every day | ORAL | Status: DC
  Administered 2022-09-23: 20 mg via ORAL
  Filled 2022-09-22: qty 1

## 2022-09-22 MED ORDER — PHENYLEPHRINE HCL (PRESSORS) 10 MG/ML IV SOLN
INTRAVENOUS | Status: DC | PRN
  Administered 2022-09-22: 160 ug via INTRAVENOUS
  Administered 2022-09-22 (×3): 80 ug via INTRAVENOUS

## 2022-09-22 MED ORDER — ONDANSETRON HCL 4 MG/2ML IJ SOLN: 4.0000 mg | Freq: Four times a day (QID) | INTRAMUSCULAR | Status: DC | PRN

## 2022-09-22 MED ORDER — HYDROCODONE-ACETAMINOPHEN 7.5-325 MG PO TABS: 1.0000 | ORAL_TABLET | ORAL | Status: DC | PRN

## 2022-09-22 MED ORDER — BUPIVACAINE HCL (PF) 0.25 % IJ SOLN
INTRAMUSCULAR | Status: AC
  Filled 2022-09-22: qty 30

## 2022-09-22 MED ORDER — POLYETHYL GLYCOL-PROPYL GLYCOL 0.4-0.3 % OP GEL: OPHTHALMIC | Status: DC | PRN

## 2022-09-22 MED ORDER — POTASSIUM CHLORIDE IN NACL 20-0.45 MEQ/L-% IV SOLN
INTRAVENOUS | Status: DC
  Filled 2022-09-22 (×2): qty 1000

## 2022-09-22 MED ORDER — METOCLOPRAMIDE HCL 5 MG/ML IJ SOLN: 5.0000 mg | Freq: Three times a day (TID) | INTRAMUSCULAR | Status: DC | PRN

## 2022-09-22 MED ORDER — MENTHOL 3 MG MT LOZG: 1.0000 | LOZENGE | OROMUCOSAL | Status: DC | PRN

## 2022-09-22 MED ORDER — FENTANYL CITRATE PF 50 MCG/ML IJ SOSY: 25.0000 ug | PREFILLED_SYRINGE | INTRAMUSCULAR | Status: DC | PRN

## 2022-09-22 MED ORDER — ALUM & MAG HYDROXIDE-SIMETH 200-200-20 MG/5ML PO SUSP: 30.0000 mL | ORAL | Status: DC | PRN

## 2022-09-22 MED ORDER — OXYCODONE HCL 5 MG PO TABS: 5.0000 mg | ORAL_TABLET | Freq: Once | ORAL | Status: DC | PRN

## 2022-09-22 MED ORDER — KETOROLAC TROMETHAMINE 30 MG/ML IJ SOLN
INTRAMUSCULAR | Status: AC
  Filled 2022-09-22: qty 1

## 2022-09-22 MED ORDER — CHLORHEXIDINE GLUCONATE 0.12 % MT SOLN
15.0000 mL | Freq: Once | OROMUCOSAL | Status: AC
  Administered 2022-09-22: 15 mL via OROMUCOSAL

## 2022-09-22 MED ORDER — OXYCODONE HCL 5 MG/5ML PO SOLN: 5.0000 mg | Freq: Once | ORAL | Status: DC | PRN

## 2022-09-22 MED ORDER — MAGNESIUM CITRATE PO SOLN: 1.0000 | Freq: Once | ORAL | Status: DC | PRN

## 2022-09-22 MED ORDER — BUPIVACAINE HCL (PF) 0.25 % IJ SOLN
INTRAMUSCULAR | Status: DC | PRN
  Administered 2022-09-22: 30 mL

## 2022-09-22 MED ORDER — DIPHENHYDRAMINE HCL 12.5 MG/5ML PO ELIX: 12.5000 mg | ORAL_SOLUTION | ORAL | Status: DC | PRN

## 2022-09-22 MED ORDER — ATORVASTATIN CALCIUM 10 MG PO TABS
10.0000 mg | ORAL_TABLET | Freq: Every day | ORAL | Status: DC
  Administered 2022-09-22 – 2022-09-23 (×2): 10 mg via ORAL
  Filled 2022-09-22 (×2): qty 1

## 2022-09-22 MED ORDER — ACETAMINOPHEN 500 MG PO TABS
500.0000 mg | ORAL_TABLET | Freq: Four times a day (QID) | ORAL | Status: DC
  Administered 2022-09-22: 500 mg via ORAL

## 2022-09-22 MED ORDER — HYDROCHLOROTHIAZIDE 12.5 MG PO TABS
12.5000 mg | ORAL_TABLET | Freq: Every day | ORAL | Status: DC
  Administered 2022-09-22 – 2022-09-23 (×2): 12.5 mg via ORAL
  Filled 2022-09-22 (×2): qty 1

## 2022-09-22 MED ORDER — DEXAMETHASONE SODIUM PHOSPHATE 10 MG/ML IJ SOLN
10.0000 mg | Freq: Once | INTRAMUSCULAR | Status: AC
  Administered 2022-09-23: 10 mg via INTRAVENOUS
  Filled 2022-09-22: qty 1

## 2022-09-22 MED ORDER — ACETAMINOPHEN 325 MG PO TABS: 325.0000 mg | ORAL_TABLET | Freq: Four times a day (QID) | ORAL | Status: DC | PRN

## 2022-09-22 MED ORDER — KETOROLAC TROMETHAMINE 30 MG/ML IJ SOLN
INTRAMUSCULAR | Status: DC | PRN
  Administered 2022-09-22: 30 mg

## 2022-09-22 MED ORDER — APIXABAN 2.5 MG PO TABS
2.5000 mg | ORAL_TABLET | Freq: Two times a day (BID) | ORAL | Status: DC
  Administered 2022-09-23: 2.5 mg via ORAL
  Filled 2022-09-22: qty 1

## 2022-09-22 MED ORDER — ACETAMINOPHEN 500 MG PO TABS
1000.0000 mg | ORAL_TABLET | Freq: Four times a day (QID) | ORAL | Status: AC
  Administered 2022-09-22 – 2022-09-23 (×3): 1000 mg via ORAL
  Filled 2022-09-22 (×3): qty 2

## 2022-09-22 MED ORDER — METOCLOPRAMIDE HCL 5 MG PO TABS: 5.0000 mg | ORAL_TABLET | Freq: Three times a day (TID) | ORAL | Status: DC | PRN

## 2022-09-22 MED ORDER — LACTATED RINGERS IV SOLN: INTRAVENOUS | Status: DC

## 2022-09-22 MED ORDER — METHOCARBAMOL 500 MG PO TABS: 500.0000 mg | ORAL_TABLET | Freq: Four times a day (QID) | ORAL | Status: DC | PRN

## 2022-09-22 MED ORDER — POVIDONE-IODINE 7.5 % EX SOLN: Freq: Once | CUTANEOUS | Status: DC

## 2022-09-22 MED ORDER — CEFAZOLIN SODIUM-DEXTROSE 2-4 GM/100ML-% IV SOLN
2.0000 g | Freq: Four times a day (QID) | INTRAVENOUS | Status: AC
  Administered 2022-09-22 (×2): 2 g via INTRAVENOUS
  Filled 2022-09-22 (×2): qty 100

## 2022-09-22 MED ORDER — PHENOL 1.4 % MT LIQD: 1.0000 | OROMUCOSAL | Status: DC | PRN

## 2022-09-22 MED ORDER — ORAL CARE MOUTH RINSE: 15.0000 mL | Freq: Once | OROMUCOSAL | Status: AC

## 2022-09-22 MED ORDER — BISACODYL 10 MG RE SUPP: 10.0000 mg | Freq: Every day | RECTAL | Status: DC | PRN

## 2022-09-22 MED ORDER — DEXAMETHASONE SODIUM PHOSPHATE 4 MG/ML IJ SOLN
INTRAMUSCULAR | Status: DC | PRN
  Administered 2022-09-22: 8 mg via INTRAVENOUS

## 2022-09-22 SURGICAL SUPPLY — 59 items
BAG COUNTER SPONGE SURGICOUNT (BAG) IMPLANT
BAG SPNG CNTER NS LX DISP (BAG) ×1
BIT DRILL 2.0X128 (BIT) ×1 IMPLANT
BLADE SAW SGTL 73X25 THK (BLADE) ×1 IMPLANT
CLSR STERI-STRIP ANTIMIC 1/2X4 (GAUZE/BANDAGES/DRESSINGS) ×2 IMPLANT
COVER SURGICAL LIGHT HANDLE (MISCELLANEOUS) ×1 IMPLANT
DRAPE INCISE IOBAN 66X45 STRL (DRAPES) ×1 IMPLANT
DRAPE ORTHO SPLIT 77X108 STRL (DRAPES) ×2
DRAPE POUCH INSTRU U-SHP 10X18 (DRAPES) ×1 IMPLANT
DRAPE SHEET LG 3/4 BI-LAMINATE (DRAPES) ×1 IMPLANT
DRAPE SURG 17X11 SM STRL (DRAPES) ×1 IMPLANT
DRAPE SURG ORHT 6 SPLT 77X108 (DRAPES) ×2 IMPLANT
DRAPE U-SHAPE 47X51 STRL (DRAPES) ×1 IMPLANT
DRSG AQUACEL AG ADV 3.5X10 (GAUZE/BANDAGES/DRESSINGS) IMPLANT
DRSG MEPILEX POST OP 4X8 (GAUZE/BANDAGES/DRESSINGS) ×1 IMPLANT
DURAPREP 26ML APPLICATOR (WOUND CARE) ×2 IMPLANT
ELECT BLADE TIP CTD 4 INCH (ELECTRODE) ×1 IMPLANT
ELECT REM PT RETURN 15FT ADLT (MISCELLANEOUS) ×1 IMPLANT
ELIMINATOR HOLE APEX DEPUY (Hips) IMPLANT
FACESHIELD WRAPAROUND (MASK) ×2 IMPLANT
FACESHIELD WRAPAROUND OR TEAM (MASK) ×2 IMPLANT
GLOVE BIO SURGEON STRL SZ 6.5 (GLOVE) ×1 IMPLANT
GLOVE BIO SURGEON STRL SZ7.5 (GLOVE) ×1 IMPLANT
GLOVE BIOGEL PI IND STRL 7.0 (GLOVE) ×1 IMPLANT
GLOVE BIOGEL PI IND STRL 8 (GLOVE) ×1 IMPLANT
GOWN STRL SURGICAL XL XLNG (GOWN DISPOSABLE) ×2 IMPLANT
HEAD M SROM 36MM PLUS 1.5 (Hips) IMPLANT
HOOD PEEL AWAY T7 (MISCELLANEOUS) ×3 IMPLANT
K-WIRE TROCAR PT 2.0 150MM (WIRE) ×1
KIT BASIN OR (CUSTOM PROCEDURE TRAY) ×1 IMPLANT
KIT TURNOVER KIT A (KITS) IMPLANT
KWIRE TROCAR PT 2.0 150 (WIRE) ×1 IMPLANT
KWIRE TROCAR PT 2.0 150MM (WIRE) ×1 IMPLANT
LINER NEUTRAL 52X36MM PLUS 4 (Liner) IMPLANT
MANIFOLD NEPTUNE II (INSTRUMENTS) ×1 IMPLANT
NDL MA TROC 1/2 (NEEDLE) IMPLANT
NDL SAFETY ECLIP 18X1.5 (MISCELLANEOUS) ×2 IMPLANT
NEEDLE ANCHOR KEITH 2 7/8 STR (NEEDLE) ×1 IMPLANT
NEEDLE MA TROC 1/2 (NEEDLE) IMPLANT
NS IRRIG 1000ML POUR BTL (IV SOLUTION) ×1 IMPLANT
PACK TOTAL JOINT (CUSTOM PROCEDURE TRAY) ×1 IMPLANT
PIN SECTOR W/GRIP ACE CUP 52MM (Hips) IMPLANT
PROTECTOR NERVE ULNAR (MISCELLANEOUS) ×1 IMPLANT
SCREW 6.5MMX25MM (Screw) IMPLANT
SROM M HEAD 36MM PLUS 1.5 (Hips) ×1 IMPLANT
SUCTION TUBE FRAZIER 12FR DISP (SUCTIONS) ×1 IMPLANT
SUT ETHIBOND NAB CT1 #1 30IN (SUTURE) ×3 IMPLANT
SUT VIC AB 0 CT1 36 (SUTURE) ×1 IMPLANT
SUT VIC AB 1 CT1 36 (SUTURE) ×3 IMPLANT
SUT VIC AB 2-0 CT1 27 (SUTURE) ×2
SUT VIC AB 2-0 CT1 TAPERPNT 27 (SUTURE) ×2 IMPLANT
SUT VIC AB 3-0 SH 27 (SUTURE) ×2
SUT VIC AB 3-0 SH 27X BRD (SUTURE) ×2 IMPLANT
SYR CONTROL 10ML LL (SYRINGE) ×2 IMPLANT
TAP DUOFIX SZ5 STD OFF (Hips) IMPLANT
TOWEL OR 17X26 10 PK STRL BLUE (TOWEL DISPOSABLE) ×1 IMPLANT
TRAY FOLEY MTR SLVR 16FR STAT (SET/KITS/TRAYS/PACK) ×1 IMPLANT
TUBE SUCTION HIGH CAP CLEAR NV (SUCTIONS) ×1 IMPLANT
WATER STERILE IRR 1000ML POUR (IV SOLUTION) ×2 IMPLANT

## 2022-09-22 NOTE — Discharge Instructions (Signed)
INSTRUCTIONS AFTER JOINT REPLACEMENT   Remove items at home which could result in a fall. This includes throw rugs or furniture in walking pathways ICE to the affected joint every three hours while awake for 30 minutes at a time, for at least the first 3-5 days, and then as needed for pain and swelling.  Continue to use ice for pain and swelling. You may notice swelling that will progress down to the foot and ankle.  This is normal after surgery.  Elevate your leg when you are not up walking on it.   Continue to use the breathing machine you got in the hospital (incentive spirometer) which will help keep your temperature down.  It is common for your temperature to cycle up and down following surgery, especially at night when you are not up moving around and exerting yourself.  The breathing machine keeps your lungs expanded and your temperature down.   DIET:  As you were doing prior to hospitalization, we recommend a well-balanced diet.  DRESSING / WOUND CARE / SHOWERING  You may shower 3 days after surgery, but keep the wounds dry during showering.  You may use an occlusive plastic wrap (Press'n Seal for example), NO SOAKING/SUBMERGING IN THE BATHTUB.  If the bandage gets wet, change with a clean dry gauze.  If the incision gets wet, pat the wound dry with a clean towel.  ACTIVITY  Increase activity slowly as tolerated, but follow the weight bearing instructions below.   No driving for 6 weeks or until further direction given by your physician.  You cannot drive while taking narcotics.  No lifting or carrying greater than 10 lbs. until further directed by your surgeon. Avoid periods of inactivity such as sitting longer than an hour when not asleep. This helps prevent blood clots.  You may return to work once you are authorized by your doctor.     WEIGHT BEARING   Weight bearing as tolerated with assist device (walker, cane, etc) as directed, use it as long as suggested by your surgeon or  therapist, typically at least 4-6 weeks.   EXERCISES  Results after joint replacement surgery are often greatly improved when you follow the exercise, range of motion and muscle strengthening exercises prescribed by your doctor. Safety measures are also important to protect the joint from further injury. Any time any of these exercises cause you to have increased pain or swelling, decrease what you are doing until you are comfortable again and then slowly increase them. If you have problems or questions, call your caregiver or physical therapist for advice.   Rehabilitation is important following a joint replacement. After just a few days of immobilization, the muscles of the leg can become weakened and shrink (atrophy).  These exercises are designed to build up the tone and strength of the thigh and leg muscles and to improve motion. Often times heat used for twenty to thirty minutes before working out will loosen up your tissues and help with improving the range of motion but do not use heat for the first two weeks following surgery (sometimes heat can increase post-operative swelling).   These exercises can be done on a training (exercise) mat, on the floor, on a table or on a bed. Use whatever works the best and is most comfortable for you.    Use music or television while you are exercising so that the exercises are a pleasant break in your day. This will make your life better with the exercises acting   as a break in your routine that you can look forward to.   Perform all exercises about fifteen times, three times per day or as directed.  You should exercise both the operative leg and the other leg as well.  Exercises include:   Quad Sets - Tighten up the muscle on the front of the thigh (Quad) and hold for 5-10 seconds.   Straight Leg Raises - With your knee straight (if you were given a brace, keep it on), lift the leg to 60 degrees, hold for 3 seconds, and slowly lower the leg.  Perform this  exercise against resistance later as your leg gets stronger.  Leg Slides: Lying on your back, slowly slide your foot toward your buttocks, bending your knee up off the floor (only go as far as is comfortable). Then slowly slide your foot back down until your leg is flat on the floor again.  Angel Wings: Lying on your back spread your legs to the side as far apart as you can without causing discomfort.  Hamstring Strength:  Lying on your back, push your heel against the floor with your leg straight by tightening up the muscles of your buttocks.  Repeat, but this time bend your knee to a comfortable angle, and push your heel against the floor.  You may put a pillow under the heel to make it more comfortable if necessary.   A rehabilitation program following joint replacement surgery can speed recovery and prevent re-injury in the future due to weakened muscles. Contact your doctor or a physical therapist for more information on knee rehabilitation.    CONSTIPATION  Constipation is defined medically as fewer than three stools per week and severe constipation as less than one stool per week.  Even if you have a regular bowel pattern at home, your normal regimen is likely to be disrupted due to multiple reasons following surgery.  Combination of anesthesia, postoperative narcotics, change in appetite and fluid intake all can affect your bowels.   YOU MUST use at least one of the following options; they are listed in order of increasing strength to get the job done.  They are all available over the counter, and you may need to use some, POSSIBLY even all of these options:    Drink plenty of fluids (prune juice may be helpful) and high fiber foods Colace 100 mg by mouth twice a day  Senokot for constipation as directed and as needed Dulcolax (bisacodyl), take with full glass of water  Miralax (polyethylene glycol) once or twice a day as needed.  If you have tried all these things and are unable to have a  bowel movement in the first 3-4 days after surgery call either your surgeon or your primary doctor.    If you experience loose stools or diarrhea, hold the medications until you stool forms back up.  If your symptoms do not get better within 1 week or if they get worse, check with your doctor.  If you experience "the worst abdominal pain ever" or develop nausea or vomiting, please contact the office immediately for further recommendations for treatment.   ITCHING:  If you experience itching with your medications, try taking only a single pain pill, or even half a pain pill at a time.  You can also use Benadryl over the counter for itching or also to help with sleep.   TED HOSE STOCKINGS:  Use stockings on both legs until for at least 2 weeks or as directed by  physician office. They may be removed at night for sleeping.  MEDICATIONS:  See your medication summary on the "After Visit Summary" that nursing will review with you.  You may have some home medications which will be placed on hold until you complete the course of blood thinner medication.  It is important for you to complete the blood thinner medication as prescribed.  PRECAUTIONS:  If you experience chest pain or shortness of breath - call 911 immediately for transfer to the hospital emergency department.   If you develop a fever greater that 101 F, purulent drainage from wound, increased redness or drainage from wound, foul odor from the wound/dressing, or calf pain - CONTACT YOUR SURGEON.                                                   FOLLOW-UP APPOINTMENTS:  If you do not already have a post-op appointment, please call the office for an appointment to be seen by your surgeon.  Guidelines for how soon to be seen are listed in your "After Visit Summary", but are typically between 1-4 weeks after surgery.   POST-OPERATIVE OPIOID TAPER INSTRUCTIONS: It is important to wean off of your opioid medication as soon as possible. If you do not  need pain medication after your surgery it is ok to stop day one. Opioids include: Codeine, Hydrocodone(Norco, Vicodin), Oxycodone(Percocet, oxycontin) and hydromorphone amongst others.  Long term and even short term use of opiods can cause: Increased pain response Dependence Constipation Depression Respiratory depression And more.  Withdrawal symptoms can include Flu like symptoms Nausea, vomiting And more Techniques to manage these symptoms Hydrate well Eat regular healthy meals Stay active Use relaxation techniques(deep breathing, meditating, yoga) Do Not substitute Alcohol to help with tapering If you have been on opioids for less than two weeks and do not have pain than it is ok to stop all together.  Plan to wean off of opioids This plan should start within one week post op of your joint replacement. Maintain the same interval or time between taking each dose and first decrease the dose.  Cut the total daily intake of opioids by one tablet each day Next start to increase the time between doses. The last dose that should be eliminated is the evening dose.   MAKE SURE YOU:  Understand these instructions.  Get help right away if you are not doing well or get worse.    Thank you for letting us be a part of your medical care team.  It is a privilege we respect greatly.  We hope these instructions will help you stay on track for a fast and full recovery!

## 2022-09-22 NOTE — Anesthesia Postprocedure Evaluation (Signed)
Anesthesia Post Note  Patient: Shawn [name]  Procedure(s) Performed: TOTAL HIP ARTHROPLASTY (Left: Hip)     Patient location during evaluation: PACU Anesthesia Type: MAC and Spinal Level of consciousness: awake Pain management: pain level controlled Vital Signs Assessment: post-procedure vital signs reviewed and stable Respiratory status: spontaneous breathing, respiratory function stable and nonlabored ventilation Cardiovascular status: blood pressure returned to baseline and stable Postop Assessment: no headache, no backache and no apparent nausea or vomiting Anesthetic complications: no  No notable events documented.  Last Vitals:  Vitals:   09/22/22 1737 09/22/22 2037  BP: (!) 141/79 131/88  Pulse: 66 68  Resp: 18 18  Temp: 36.9 C 36.6 C  SpO2: 96% 96%    Last Pain:  Vitals:   09/22/22 2037  TempSrc: Oral  PainSc:                  [name]

## 2022-09-22 NOTE — Interval H&P Note (Signed)
History and Physical Interval Note:  09/22/2022 9:19 AM  Shawn [name]  has presented today for surgery, with the diagnosis of left hip OA.  The various methods of treatment have been discussed with the patient and family. After consideration of risks, benefits and other options for treatment, the patient has consented to  Procedure(s): TOTAL HIP ARTHROPLASTY (Left) as a surgical intervention.  The patient's history has been reviewed, patient examined, no change in status, stable for surgery.  I have reviewed the patient's chart and labs.  Questions were answered to the patient's satisfaction.     Eulas Post

## 2022-09-22 NOTE — Evaluation (Signed)
Physical Therapy Evaluation Patient Details Name: Shawn [name] MRN: [id number] DOB: [birth year] Today's Date: 09/22/2022  History of Present Illness  77 yo male presents to therapy s/p L THA, posterior lateral approach on 09/22/2022 due to failure of conservative measures. Pt is currently LLE WBAT and no formal posterior hip precautions. Pt PMH includes but is not limited to BPH, HOH, CKD II, HDL, HTN, lumbar spondylosis, PE, and hypothyroidism.  Clinical Impression    Shawn [name] is a 77 y.o. male POD 0 s/p L THA. Patient reports mod I with mobility at baseline. Patient is now limited by functional impairments (see PT problem list below) and requires min guard for bed mobility and min guard and cues for transfers. Patient was able to ambulate 50 feet with RW and min guard level of assist. Patient instructed in exercise to facilitate ROM and circulation to manage edema. Provided incentive spirometer and with Vcs pt able to achieve 2500 mL. Pt ed provided on slowly returning to prior exercise program including elliptical and reinforced pt ed on use of RW in home setting. Patient will benefit from continued skilled PT interventions to address impairments and progress towards PLOF. Acute PT will follow to progress mobility and stair training in preparation for safe discharge home with family support and OPPT 6/14.     Recommendations for follow up therapy are one component of a multi-disciplinary discharge planning process, led by the attending physician.  Recommendations may be updated based on patient status, additional functional criteria and insurance authorization.  Follow Up Recommendations       Assistance Recommended at Discharge Intermittent Supervision/Assistance  Patient can return home with the following  A little help with walking and/or transfers;A little help with bathing/dressing/bathroom;Assistance with cooking/housework;Assist for transportation;Help with stairs or ramp for  entrance    Equipment Recommendations Rolling walker (2 wheels)  Recommendations for Other Services       Functional Status Assessment Patient has had a recent decline in their functional status and demonstrates the ability to make significant improvements in function in a reasonable and predictable amount of time.     Precautions / Restrictions Precautions Precautions: Fall Restrictions Weight Bearing Restrictions: No      Mobility  Bed Mobility Overal bed mobility: Needs Assistance Bed Mobility: Supine to Sit     Supine to sit: Min guard, HOB elevated (bed rails)     General bed mobility comments: min ceus    Transfers Overall transfer level: Needs assistance Equipment used: Rolling walker (2 wheels) Transfers: Sit to/from Stand Sit to Stand: Min guard           General transfer comment: cues for proper UE placement    Ambulation/Gait Ambulation/Gait assistance: Min guard Gait Distance (Feet): 50 Feet Assistive device: Rolling walker (2 wheels) Gait Pattern/deviations: Step-to pattern, Antalgic Gait velocity: decreased     General Gait Details: pt reports L knee instabiltiy PLOF no evidence at eval  Stairs            Wheelchair Mobility    Modified Rankin (Stroke Patients Only)       Balance Overall balance assessment: Needs assistance Sitting-balance support: Feet supported Sitting balance-Leahy Scale: Good     Standing balance support: Bilateral upper extremity supported, Reliant on assistive device for balance, During functional activity Standing balance-Leahy Scale: Poor  Pertinent Vitals/Pain Pain Assessment Pain Assessment: 0-10 Pain Score: 3  Pain Location: L hip Pain Descriptors / Indicators: Dull, Aching Pain Intervention(s): Limited activity within patient's tolerance, Monitored during session, Repositioned, Ice applied (nurse reports pt is declining pain medication other than  tylenol at time of eval)    Home Living Family/patient expects to be discharged to:: Private residence Living Arrangements: Spouse/significant other;Children Available Help at Discharge: Family Type of Home: House Home Access: Stairs to enter Entrance Stairs-Rails: Right Entrance Stairs-Number of Steps: 3   Home Layout: One level Home Equipment: Cane - single point      Prior Function Prior Level of Function : Independent/Modified Independent;Driving             Mobility Comments: mod I with use of SPC 50% of the time for mobiltiy, mod I for ADLs, self care tasks, IADLs       Hand Dominance        Extremity/Trunk Assessment        Lower Extremity Assessment Lower Extremity Assessment: LLE deficits/detail LLE Deficits / Details: ankle DF/PF 5/5 LLE Sensation: WNL    Cervical / Trunk Assessment Cervical / Trunk Assessment:  (wfl)  Communication      Cognition Arousal/Alertness: Awake/alert Behavior During Therapy: WFL for tasks assessed/performed Overall Cognitive Status: Within Functional Limits for tasks assessed                                          General Comments      Exercises Total Joint Exercises Ankle Circles/Pumps: AROM, Both, 20 reps   Assessment/Plan    PT Assessment Patient needs continued PT services  PT Problem List Decreased strength;Decreased range of motion;Decreased activity tolerance;Decreased balance;Decreased mobility;Decreased coordination;Pain       PT Treatment Interventions DME instruction;Gait training;Stair training;Functional mobility training;Therapeutic activities;Therapeutic exercise;Balance training;Neuromuscular re-education;Patient/family education;Modalities    PT Goals (Current goals can be found in the Care Plan section)  Acute Rehab PT Goals Patient Stated Goal: to return to normal activity including eliptical PT Goal Formulation: With patient Time For Goal Achievement: 10/06/22 Potential  to Achieve Goals: Good    Frequency 7X/week     Co-evaluation               AM-PAC PT "6 Clicks" Mobility  Outcome Measure Help needed turning from your back to your side while in a flat bed without using bedrails?: A Little Help needed moving from lying on your back to sitting on the side of a flat bed without using bedrails?: A Little Help needed moving to and from a bed to a chair (including a wheelchair)?: A Little Help needed standing up from a chair using your arms (e.g., wheelchair or bedside chair)?: A Little Help needed to walk in hospital room?: A Little Help needed climbing 3-5 steps with a railing? : Total 6 Click Score: 16    End of Session Equipment Utilized During Treatment: Gait belt Activity Tolerance: Patient tolerated treatment well Patient left: in chair;with call bell/phone within reach;with family/visitor present Nurse Communication: Mobility status PT Visit Diagnosis: Unsteadiness on feet (R26.81);Other abnormalities of gait and mobility (R26.89);Muscle weakness (generalized) (M62.81);Pain Pain - Right/Left: Left Pain - part of body: Hip    Time: 1610-9604 PT Time Calculation (min) (ACUTE ONLY): 39 min   Charges:     PT Treatments $Gait Training: 8-22 mins $Therapeutic Activity: 8-22 mins  Johnny Bridge, PT Acute Rehab   Jacqualyn Posey 09/22/2022, 5:09 PM

## 2022-09-22 NOTE — Op Note (Signed)
09/22/2022  12:21 PM  PATIENT:  Shawn [name]   MRN: [id number]  PRE-OPERATIVE DIAGNOSIS: Left hip primary localized osteoarthritis  POST-OPERATIVE DIAGNOSIS:  same  PROCEDURE:  Procedure(s): TOTAL HIP ARTHROPLASTY  PREOPERATIVE INDICATIONS:    DIARRA KOS is an 77 y.o. male who has a diagnosis of left hip primary localized osteoarthritis and elected for surgical management after failing conservative treatment.  The risks benefits and alternatives were discussed with the patient including but not limited to the risks of nonoperative treatment, versus surgical intervention including infection, bleeding, nerve injury, periprosthetic fracture, the need for revision surgery, dislocation, leg length discrepancy, blood clots, cardiopulmonary complications, morbidity, mortality, among others, and they were willing to proceed.     OPERATIVE REPORT     SURGEON:  Teryl Lucy, MD    ASSISTANT:  Janine Ores, PA-C, (Present throughout the entire procedure,  necessary for completion of procedure in a timely manner, assisting with retraction, instrumentation, and closure)     ANESTHESIA: Spinal  ESTIMATED BLOOD LOSS: 350 mL    COMPLICATIONS:  None.     UNIQUE ASPECTS OF THE CASE: The greater trochanter was fairly large, and overhanging posteriorly, making access somewhat challenging.  I cut the neck twice although the second cut was not much of a resection, and I elected to countersink a size 3 broach handle and then remove a little bit more neck in order to get access to the acetabulum.  The acetabular bony anatomy looked normal on plain films, but in situ I felt like he was fairly shallow, and it was difficult to find a good circumferential coverage, when I was matching his anterior wall he had a fair amount of beads showing both posteriorly and superiorly, but I felt like that was the most mechanically sound construct, so I felt like the cup was hanging out of the pelvis more than typical, but  had satisfactory fixation and good stability ultimately.  The augmentation screw had excellent purchase as well.  COMPONENTS:  [employer] fit femur size 5 with a 36 mm + 1.5 metallic head ball and a Gription Acetabular shell size 52, with a single cancellous screw for backup fixation, with an apex hole eliminator and a +4 neutral polyethylene liner.    PROCEDURE IN DETAIL:   The patient was met in the holding area and  identified.  The appropriate hip was identified and marked at the operative site.  The patient was then transported to the OR  and  placed under anesthesia.  At that point, the patient was  placed in the lateral decubitus position with the operative side up and  secured to the operating room table and all bony prominences padded.     The operative lower extremity was prepped from the iliac crest to the distal leg.  Sterile draping was performed.  Time out was performed prior to incision.      A routine posterolateral approach was utilized via sharp dissection  carried down to the subcutaneous tissue.  Gross bleeders were Bovie coagulated.  The iliotibial band was identified and incised along the length of the skin incision.  Self-retaining retractors were  inserted.  With the hip internally rotated, the short external rotators  were identified. The piriformis and capsule was tagged with FiberWire, and the hip capsule released in a T-type fashion.  The femoral neck was exposed, and I resected the femoral neck using the appropriate jig. This was performed at approximately a thumb's breadth above the lesser  trochanter.    I then exposed the deep acetabulum, at first it was difficult to access, so I removed a little bit more bone, and then elect to countersink a broach in order to calcar plane to remove a little bit more neck.  I then prepared the proximal femur using the cookie-cutter, the lateralizing reamer, and then sequentially reamed and broached.  I calcar planed  using the 3 broach slightly countersunk.  I then turned my attention back to the acetabulum, and cleared out any tissue including the ligamentum teres.  A wing retractor was placed, although I did not hold the tissue very well so I ultimately abandoned it.  After adequate visualization, I excised the labrum, and then sequentially reamed.  I placed the trial acetabulum, which seated nicely, although the coverage posteriorly and superiorly was not great, I had a fair amount of beads showing, and then impacted the real cup into place.  I wondered whether or not he was externally rotated on the table, but I did not want to put the cup into retroversion.  Appropriate version and inclination was confirmed clinically matching their bony anatomy, and also with the use of the jig.  I placed a cancellous screw to augment fixation.  A trial polyethylene liner was placed and the wing retractor removed.    A trial broach, neck, and head was utilized, and I reduced the hip and it was found to have excellent stability with functional range of motion. The trial components were then removed, and the real polyethylene liner was placed.  I then impacted the real femoral prosthesis into place into the appropriate version, slightly anteverted to the normal anatomy, and I impacted the real head ball into place. The hip was then reduced and taken through functional range of motion and found to have excellent stability. Leg lengths were restored.  I then used a 2 mm drill bits to pass the FiberWire suture from the capsule and piriformis through the greater trochanter, and secured this. Excellent posterior capsular repair was achieved. I also closed the T in the capsule.  I then irrigated the hip copiously again with pulse lavage, and repaired the fascia with Vicryl, followed by Vicryl for the subcutaneous tissue, Monocryl for the skin, Steri-Strips and sterile gauze. The wounds were injected. The patient was then awakened and  returned to PACU in stable and satisfactory condition. There were no complications.  Teryl Lucy, MD Orthopedic Surgeon [phone]   09/22/2022 12:21 PM

## 2022-09-22 NOTE — Anesthesia Procedure Notes (Signed)
Spinal  Patient location during procedure: OR Start time: 09/22/2022 10:22 AM End time: 09/22/2022 10:30 AM Reason for block: surgical anesthesia Staffing Performed: anesthesiologist  Anesthesiologist: [name], MD Performed by: [name], MD Authorized by: [name], MD   Preanesthetic Checklist Completed: patient identified, IV checked, site marked, risks and benefits discussed, surgical consent, monitors and equipment checked, pre-op evaluation and timeout performed Spinal Block Patient position: sitting Prep: DuraPrep Patient monitoring: blood pressure and continuous pulse ox Approach: midline Location: L3-4 Injection technique: single-shot Needle Needle type: Pencan  Needle gauge: 24 G Needle length: 9 cm Additional Notes Risks and benefits of neuraxial anesthesia including, but not limited to, infection, bleeding, local anesthetic toxicity, headache, hypotension, back pain, block failure, etc. were discussed with the patient. The patient expressed understanding and consented to the procedure. I confirmed that the patient has no bleeding disorders and is not taking blood thinners. I confirmed the patient's last platelet count with the nurse. Monitors were applied. A time-out was performed immediately prior to the procedure. Sterile technique was used throughout the whole procedure.

## 2022-09-22 NOTE — Transfer of Care (Signed)
Immediate Anesthesia Transfer of Care Note  Patient: Shawn [name]  Procedure(s) Performed: TOTAL HIP ARTHROPLASTY (Left: Hip)  Patient Location: PACU  Anesthesia Type:Regional  Level of Consciousness: drowsy and patient cooperative  Airway & Oxygen Therapy: Patient Spontanous Breathing and Patient connected to face mask oxygen  Post-op Assessment: Report given to RN and Post -op Vital signs reviewed and stable  Post vital signs: Reviewed and stable  Last Vitals:  Vitals Value Taken Time  BP 120/75 09/22/22 1300  Temp    Pulse 55 09/22/22 1302  Resp 17 09/22/22 1302  SpO2 100 % 09/22/22 1302  Vitals shown include unvalidated device data.  Last Pain:  Vitals:   09/22/22 0757  TempSrc:   PainSc: 4       Patients Stated Pain Goal: 3 (09/22/22 0757)  Complications: No notable events documented.

## 2022-09-22 NOTE — Plan of Care (Signed)
  Problem: Education: Goal: Knowledge of the prescribed therapeutic regimen will improve Outcome: Progressing Goal: Understanding of discharge needs will improve Outcome: Progressing Goal: Individualized Educational Video(s) Outcome: Completed/Met   Problem: Activity: Goal: Ability to avoid complications of mobility impairment will improve Outcome: Adequate for Discharge Goal: Ability to tolerate increased activity will improve Outcome: Adequate for Discharge   Problem: Clinical Measurements: Goal: Postoperative complications will be avoided or minimized Outcome: Progressing   Problem: Pain Management: Goal: Pain level will decrease with appropriate interventions Outcome: Adequate for Discharge   Problem: Skin Integrity: Goal: Will show signs of wound healing Outcome: Progressing   Problem: Education: Goal: Knowledge of General Education information will improve Description: Including pain rating scale, medication(s)/side effects and non-pharmacologic comfort measures Outcome: Progressing   Problem: Health Behavior/Discharge Planning: Goal: Ability to manage health-related needs will improve Outcome: Adequate for Discharge   Problem: Clinical Measurements: Goal: Ability to maintain clinical measurements within normal limits will improve Outcome: Progressing Goal: Will remain free from infection Outcome: Progressing Goal: Diagnostic test results will improve Outcome: Progressing Goal: Respiratory complications will improve Outcome: Progressing Goal: Cardiovascular complication will be avoided Outcome: Progressing   Problem: Activity: Goal: Risk for activity intolerance will decrease Outcome: Completed/Met   Problem: Nutrition: Goal: Adequate nutrition will be maintained Outcome: Completed/Met   Problem: Coping: Goal: Level of anxiety will decrease Outcome: Progressing

## 2022-09-23 ENCOUNTER — Encounter (HOSPITAL_COMMUNITY): Payer: Self-pay | Admitting: Orthopedic Surgery

## 2022-09-23 DIAGNOSIS — Z7901 Long term (current) use of anticoagulants: Secondary | ICD-10-CM | POA: Diagnosis not present

## 2022-09-23 DIAGNOSIS — I129 Hypertensive chronic kidney disease with stage 1 through stage 4 chronic kidney disease, or unspecified chronic kidney disease: Secondary | ICD-10-CM | POA: Diagnosis not present

## 2022-09-23 DIAGNOSIS — Z79899 Other long term (current) drug therapy: Secondary | ICD-10-CM | POA: Diagnosis not present

## 2022-09-23 DIAGNOSIS — M1612 Unilateral primary osteoarthritis, left hip: Secondary | ICD-10-CM | POA: Diagnosis not present

## 2022-09-23 DIAGNOSIS — E039 Hypothyroidism, unspecified: Secondary | ICD-10-CM | POA: Diagnosis not present

## 2022-09-23 DIAGNOSIS — Z86711 Personal history of pulmonary embolism: Secondary | ICD-10-CM | POA: Diagnosis not present

## 2022-09-23 LAB — CBC
HCT: 34 % — ABNORMAL LOW (ref 39.0–52.0)
Hemoglobin: 11.3 g/dL — ABNORMAL LOW (ref 13.0–17.0)
MCH: 27.5 pg (ref 26.0–34.0)
MCHC: 33.2 g/dL (ref 30.0–36.0)
MCV: 82.7 fL (ref 80.0–100.0)
Platelets: 160 10*3/uL (ref 150–400)
RBC: 4.11 MIL/uL — ABNORMAL LOW (ref 4.22–5.81)
RDW: 14.8 % (ref 11.5–15.5)
WBC: 10 10*3/uL (ref 4.0–10.5)
nRBC: 0 % (ref 0.0–0.2)

## 2022-09-23 LAB — BASIC METABOLIC PANEL
Anion gap: 8 (ref 5–15)
BUN: 18 mg/dL (ref 8–23)
CO2: 23 mmol/L (ref 22–32)
Calcium: 8.6 mg/dL — ABNORMAL LOW (ref 8.9–10.3)
Chloride: 99 mmol/L (ref 98–111)
Creatinine, Ser: 0.81 mg/dL (ref 0.61–1.24)
GFR, Estimated: >60 mL/min (ref >60)
Glucose, Bld: 133 mg/dL — ABNORMAL HIGH (ref 70–99)
Potassium: 3.9 mmol/L (ref 3.5–5.1)
Sodium: 130 mmol/L — ABNORMAL LOW (ref 135–145)

## 2022-09-23 MED ORDER — TAMSULOSIN HCL 0.4 MG PO CAPS
0.4000 mg | ORAL_CAPSULE | Freq: Once | ORAL | Status: AC
  Administered 2022-09-23: 0.4 mg via ORAL
  Filled 2022-09-23: qty 1

## 2022-09-23 MED ORDER — ONDANSETRON HCL 4 MG PO TABS: 4.0000 mg | ORAL_TABLET | Freq: Three times a day (TID) | ORAL | 0 refills | Status: DC | PRN

## 2022-09-23 MED ORDER — SENNA-DOCUSATE SODIUM 8.6-50 MG PO TABS: 2.0000 | ORAL_TABLET | Freq: Every day | ORAL | 0 refills | Status: DC | PRN

## 2022-09-23 MED ORDER — TRAMADOL HCL 50 MG PO TABS: 50.0000 mg | ORAL_TABLET | Freq: Four times a day (QID) | ORAL | Status: DC | PRN

## 2022-09-23 MED ORDER — METHOCARBAMOL 750 MG PO TABS: 750.0000 mg | ORAL_TABLET | Freq: Three times a day (TID) | ORAL | 0 refills | Status: DC | PRN

## 2022-09-23 NOTE — Progress Notes (Signed)
Subjective: 1 Day Post-Op s/p Procedure(s): TOTAL HIP ARTHROPLASTY   Patient is alert, oriented. Reports pain as well controlled with tylenol. Passing flatus. Foley has been removed, has not voided on own yet. Denies chest pain, SOB, Calf pain. No nausea/vomiting. No other complaints.    Objective:  PE: VITALS:   Vitals:   09/22/22 1737 09/22/22 2037 09/23/22 0139 09/23/22 0541  BP: (!) 141/79 131/88 127/79 114/78  Pulse: 66 68 64 65  Resp: 18 18 18 18   Temp: 98.5 F (36.9 C) 97.9 F (36.6 C) 97.7 F (36.5 C) 98.2 F (36.8 C)  TempSrc:  Oral Oral Oral  SpO2: 96% 96% 96% 96%  Weight:      Height:        ABD soft Sensation intact distally Intact pulses distally Dorsiflexion/Plantar flexion intact Incision: moderate drainage, dressing replaced  LABS  Results for orders placed or performed during the hospital encounter of 09/22/22 (from the past 24 hour(s))  CBC     Status: Abnormal   Collection Time: 09/23/22  3:24 AM  Result Value Ref Range   WBC 10.0 4.0 - 10.5 K/uL   RBC 4.11 (L) 4.22 - 5.81 MIL/uL   Hemoglobin 11.3 (L) 13.0 - 17.0 g/dL   HCT 29.5 (L) 62.1 - 30.8 %   MCV 82.7 80.0 - 100.0 fL   MCH 27.5 26.0 - 34.0 pg   MCHC 33.2 30.0 - 36.0 g/dL   RDW 65.7 84.6 - 96.2 %   Platelets 160 150 - 400 K/uL   nRBC 0.0 0.0 - 0.2 %  Basic metabolic panel     Status: Abnormal   Collection Time: 09/23/22  3:24 AM  Result Value Ref Range   Sodium 130 (L) 135 - 145 mmol/L   Potassium 3.9 3.5 - 5.1 mmol/L   Chloride 99 98 - 111 mmol/L   CO2 23 22 - 32 mmol/L   Glucose, Bld 133 (H) 70 - 99 mg/dL   BUN 18 8 - 23 mg/dL   Creatinine, Ser 9.52 0.61 - 1.24 mg/dL   Calcium 8.6 (L) 8.9 - 10.3 mg/dL   GFR, Estimated >84 >13 mL/min   Anion gap 8 5 - 15    DG Pelvis Portable  Result Date: 09/22/2022 CLINICAL DATA:  Status post left hip arthroplasty. EXAM: DG HIP (WITH OR WITHOUT PELVIS) 1V PORT LEFT; PORTABLE PELVIS 1-2 VIEWS COMPARISON:  None Available. FINDINGS:  Left hip arthroplasty in expected alignment. No periprosthetic lucency or fracture. Recent postsurgical change includes air and edema in the soft tissues. IMPRESSION: Left hip arthroplasty without immediate postoperative complication. Electronically Signed   By: Narda Rutherford M.D.   On: 09/22/2022 13:53   DG Hip Port Unilat With Pelvis 1V Left  Result Date: 09/22/2022 CLINICAL DATA:  Status post left hip arthroplasty. EXAM: DG HIP (WITH OR WITHOUT PELVIS) 1V PORT LEFT; PORTABLE PELVIS 1-2 VIEWS COMPARISON:  None Available. FINDINGS: Left hip arthroplasty in expected alignment. No periprosthetic lucency or fracture. Recent postsurgical change includes air and edema in the soft tissues. IMPRESSION: Left hip arthroplasty without immediate postoperative complication. Electronically Signed   By: Narda Rutherford M.D.   On: 09/22/2022 13:53    Assessment/Plan: Principal Problem:   S/P total left hip arthroplasty  1 Day Post-Op s/p Procedure(s): TOTAL HIP ARTHROPLASTY  Weightbearing: WBAT LLE Insicional and dressing care: Reinforce dressings as needed VTE prophylaxis: Home Eliquis restarted today Pain control: does not want narcotics, tylenol only Follow - up plan: 2 weeks  with Dr. Dion Saucier Dispo: ideally later today, needs to void on own and pass PT  Contact information:   Janine Ores, PA-C Weekdays 8-5  After hours and holidays please check [email or web address] for group call information for [facility]  Armida Sans 09/23/2022, 8:47 AM

## 2022-09-23 NOTE — Progress Notes (Signed)
Physical Therapy Treatment Patient Details Name: Shawn [name] MRN: [id number] DOB: [birth year] Today's Date: 09/23/2022   History of Present Illness 77 yo male presents to therapy s/p L THA, posterior lateral approach on 09/22/2022 due to failure of conservative measures. Pt is currently LLE WBAT and no formal posterior hip precautions. Pt PMH includes but is not limited to BPH, HOH, CKD II, HDL, HTN, lumbar spondylosis, PE, and hypothyroidism.    PT Comments    Pt is POD # 1 and is progressing well.  He ambulated 150' and performed stairs similar to home set up.  Pt with good pain control with minimal pain meds.  Pt demonstrates safe gait & transfers in order to return home from PT perspective once discharged by MD.  While in hospital, will continue to benefit from PT for skilled therapy to advance mobility and exercises.      Recommendations for follow up therapy are one component of a multi-disciplinary discharge planning process, led by the attending physician.  Recommendations may be updated based on patient status, additional functional criteria and insurance authorization.  Follow Up Recommendations       Assistance Recommended at Discharge Intermittent Supervision/Assistance  Patient can return home with the following A little help with walking and/or transfers;A little help with bathing/dressing/bathroom;Assistance with cooking/housework;Assist for transportation;Help with stairs or ramp for entrance   Equipment Recommendations  Rolling walker (2 wheels)    Recommendations for Other Services       Precautions / Restrictions Precautions Precautions: Fall Precaution Comments: NO posterior precautions Restrictions Weight Bearing Restrictions: No     Mobility  Bed Mobility               General bed mobility comments: in chair    Transfers Overall transfer level: Needs assistance Equipment used: Rolling walker (2 wheels) Transfers: Sit to/from Stand Sit to Stand:  Supervision           General transfer comment: good hand placement    Ambulation/Gait Ambulation/Gait assistance: Supervision Gait Distance (Feet): 150 Feet Assistive device: Rolling walker (2 wheels) Gait Pattern/deviations: Step-through pattern Gait velocity: decreased but functional     General Gait Details: Min decrease in weight shift to L; steady gait; good RW proximity   Stairs Stairs: Yes Stairs assistance: Min guard Stair Management: One rail Right, With cane, Step to pattern, Forwards, Alternating pattern Number of Stairs: 4 General stair comments: Performed safely; did alternating pattern up but step to down -educated on step to both if needed but pt comfortable and safe in this Musician    Modified Rankin (Stroke Patients Only)       Balance Overall balance assessment: Needs assistance Sitting-balance support: Feet supported Sitting balance-Leahy Scale: Good     Standing balance support: Reliant on assistive device for balance, During functional activity, No upper extremity supported Standing balance-Leahy Scale: Good Standing balance comment: RW to ambulate but able to stand for ADLs without AD and safely switching UE support when transition to stairs or toilet                            Cognition Arousal/Alertness: Awake/alert Behavior During Therapy: WFL for tasks assessed/performed Overall Cognitive Status: Within Functional Limits for tasks assessed                                 General Comments: Pt  very analytical and safe with all movements        Exercises Total Joint Exercises Ankle Circles/Pumps: AROM, Both, 10 reps, Supine Quad Sets: AROM, Both, 10 reps, Supine Heel Slides: AROM, Both, 10 reps, Supine Hip ABduction/ADduction: AROM, Right, AAROM, Left, 10 reps, Supine Long Arc Quad: AROM, Both, 10 reps, Seated Other Exercises Other Exercises: pt tolerating well ; educated on exercises  to tolerance and AAROM with belt if needed    General Comments  Educated on safe ice use, no pivots, car transfers, and TED hose during day. Also, encouraged walking every 1-2 hours during day. Educated on HEP with focus on mobility the first weeks. Discussed doing exercises within pain control and if pain increasing could decreased ROM, reps, and stop exercises as needed. Encouraged to perform  ankle pumps frequently for blood flow. Educated pt on gradual return to normal activities and having clearance from MD prior to resuming yard work, elliptical, strenuous activities.      Pertinent Vitals/Pain Pain Assessment Pain Assessment: 0-10 Pain Score: 2  (2/10 rest; 4/10 walk) Pain Location: L hip Pain Descriptors / Indicators: Dull, Aching Pain Intervention(s): Limited activity within patient's tolerance, Monitored during session, Ice applied (declines further meds; tolerating pain well)    Home Living                          Prior Function            PT Goals (current goals can now be found in the care plan section) Progress towards PT goals: Progressing toward goals    Frequency    7X/week      PT Plan Current plan remains appropriate    Co-evaluation              AM-PAC PT "6 Clicks" Mobility   Outcome Measure  Help needed turning from your back to your side while in a flat bed without using bedrails?: None Help needed moving from lying on your back to sitting on the side of a flat bed without using bedrails?: A Little Help needed moving to and from a bed to a chair (including a wheelchair)?: A Little Help needed standing up from a chair using your arms (e.g., wheelchair or bedside chair)?: A Little Help needed to walk in hospital room?: A Little Help needed climbing 3-5 steps with a railing? : A Little 6 Click Score: 19    End of Session Equipment Utilized During Treatment: Gait belt Activity Tolerance: Patient tolerated treatment well Patient  left: in chair;with call bell/phone within reach Nurse Communication: Mobility status PT Visit Diagnosis: Unsteadiness on feet (R26.81);Other abnormalities of gait and mobility (R26.89);Muscle weakness (generalized) (M62.81);Pain Pain - Right/Left: Left Pain - part of body: Hip     Time: 0933-1009 PT Time Calculation (min) (ACUTE ONLY): 36 min  Charges:  $Gait Training: 8-22 mins $Therapeutic Exercise: 8-22 mins                     Anise Salvo, PT Acute Rehab [facility] [phone]    Rayetta Humphrey 09/23/2022, 10:25 AM

## 2022-09-23 NOTE — TOC Transition Note (Signed)
Transition of Care [facility]) - CM/SW Discharge Note   Patient Details  Name: Shawn [name] MRN: [id number] Date of Birth: 09-May-1945  [facility] of [facility] CM/SW Contact:  Amada Jupiter, LCSW Phone Number: 09/23/2022, 10:25 AM   Clinical Narrative:     Met with pt and confirming he has received RW to room via [employer].  OPPT already arranged with [facility] PT.  No further TOC needs.  Final next level of care: OP Rehab Barriers to Discharge: No Barriers Identified   Patient Goals and CMS Choice      Discharge Placement                         Discharge Plan and Services Additional resources added to the After Visit Summary for                  DME Arranged: Walker rolling DME Agency: [employer]                  Social Determinants of Health (SDOH) Interventions SDOH Screenings   Food Insecurity: No Food Insecurity (09/22/2022)  Housing: Low Risk  (09/22/2022)  Transportation Needs: No Transportation Needs (09/22/2022)  Utilities: Not At Risk (09/22/2022)  Alcohol Screen: Low Risk  (01/09/2021)  Depression (PHQ2-9): Low Risk  (07/13/2022)  Financial Resource Strain: Low Risk  (07/09/2022)  Physical Activity: Insufficiently Active (07/09/2022)  Social Connections: Socially Integrated (07/09/2022)  Stress: No Stress Concern Present (07/09/2022)  Tobacco Use: Low Risk  (09/23/2022)     Readmission Risk Interventions     No data to display

## 2022-09-23 NOTE — Discharge Summary (Signed)
Discharge Summary  Patient ID: Shawn [name] MRN: [id number] DOB/AGE: [birth year] 75 y.o.  Admit date: 09/22/2022 Discharge date: 09/23/2022  Admission Diagnoses:  S/P total left hip arthroplasty  Discharge Diagnoses:  Principal Problem:   S/P total left hip arthroplasty   Past Medical History:  Diagnosis Date   Acute urinary retention 12/2018   ?BPH   Asymmetrical sensorineural hearing loss 2021   Likely from noise exposure.  [facility] [phone] ENT referral for this dx being relatively new.   Barrett's esophagus    Dr. Loreta Ave   Cataract    Chronic renal insufficiency, stage 2 (mild) 2017   Stage II/III (GFR 60 ml/min)   GERD (gastroesophageal reflux disease)    History of adenomatous polyp of colon 09/2007   Polyps at 1st screening colonoscopy; 2014 no polyps.  Rpt 12/29/17: adenomatous polyp-->  Recall 5 yrs.   Hyperlipemia, mixed    Pt intol of welchol; fenofibrate helpful.  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  Unprovoked->hosp ref to hem/onc.  Pulm recommended anticoag indefinitely.   Subclinical hypothyroidism    per old PCP records.  TPO ab's elevated mildly 12/2017, with TSH 6.6 and normal T4 and T3==>developing Hashimoto's.    Thrombocytopenia (HCC)    Hx of, mild/stable with monitoring per old PCP records    Surgeries: Procedure(s): TOTAL HIP ARTHROPLASTY on 09/22/2022   Consultants (if any):   Discharged Condition: Improved  Hospital Course: Shawn [name] is an 77 y.o. male who was admitted 09/22/2022 with a diagnosis of S/P total left hip arthroplasty and went to the operating room on 09/22/2022 and underwent the above named procedures.    He was given perioperative antibiotics:  Anti-infectives (From admission,  onward)    Start     Dose/Rate Route Frequency Ordered Stop   09/22/22 1700  ceFAZolin (ANCEF) IVPB 2g/100 mL premix        2 g 200 mL/hr over 30 Minutes Intravenous Every 6 hours 09/22/22 1526 09/22/22 2343   09/22/22 0745  ceFAZolin (ANCEF) IVPB 2g/100 mL premix        2 g 200 mL/hr over 30 Minutes Intravenous On call to O.R. 09/22/22 5621 09/22/22 1054     .  He was given sequential compression devices, early ambulation, and baseline Eliquis for DVT prophylaxis.  He benefited maximally from the hospital stay and there were no complications.    Recent vital signs:  Vitals:   09/23/22 0541 09/23/22 0928  BP: 114/78 135/75  Pulse: 65 68  Resp: 18 17  Temp: 98.2 F (36.8 C) 97.8 F (36.6 C)  SpO2: 96% 95%    Recent laboratory studies:  Lab Results  Component Value Date   HGB 11.3 (L) 09/23/2022   HGB 14.3 09/09/2022   HGB 15.0 07/07/2022   Lab Results  Component Value Date   WBC 10.0 09/23/2022   PLT 160 09/23/2022   No results found for: "INR" Lab Results  Component Value Date   NA 130 (L) 09/23/2022   K 3.9 09/23/2022   CL 99 09/23/2022   CO2 23 09/23/2022   BUN 18 09/23/2022   CREATININE 0.81 09/23/2022   GLUCOSE 133 (H) 09/23/2022    Discharge Medications:  Allergies as of 09/23/2022   No Known Allergies      Medication List     TAKE these medications    atorvastatin 20 MG tablet Commonly known as: LIPITOR Take 1 tablet (20 mg total) by mouth daily. What changed: how much to take   Eliquis 2.5 MG Tabs tablet Generic drug: apixaban TAKE 1 TABLET TWICE A DAY (OFFICE VISIT NEEDED FOR FURTHER REFILLS)   EPINEPHrine 0.3 mg/0.3 mL Soaj injection Commonly known as: EPI-PEN Inject 0.3 mg into the muscle as needed for anaphylaxis.   fosinopril 20 MG tablet Commonly known as: MONOPRIL TAKE 1 TABLET EVERY MORNING   hydrochlorothiazide 25 MG tablet Commonly known as: HYDRODIURIL Take 0.5 tablets (12.5 mg total) by mouth daily.   omeprazole  40 MG capsule Commonly known as: PRILOSEC Take 40 mg by mouth daily.   ondansetron 4 MG tablet Commonly known as: Zofran Take 1 tablet (4 mg total) by mouth every 8 (eight) hours as needed for nausea or vomiting.   sennosides-docusate sodium 8.6-50 MG tablet Commonly known as: SENOKOT-S Take 2 tablets by mouth daily as needed for constipation.   SYSTANE OP Place 1 drop into both eyes as needed (dry eyes).        Diagnostic Studies: DG Pelvis Portable  Result Date: 09/22/2022 CLINICAL DATA:  Status post left hip arthroplasty. EXAM: DG HIP (WITH OR WITHOUT PELVIS) 1V PORT LEFT; PORTABLE PELVIS 1-2 VIEWS COMPARISON:  None Available. FINDINGS: Left hip arthroplasty in expected alignment. No periprosthetic lucency or fracture. Recent postsurgical change includes air and edema in the soft tissues. IMPRESSION: Left hip arthroplasty without immediate postoperative complication. Electronically Signed   By: Narda Rutherford M.D.   On: 09/22/2022 13:53   DG Hip Port Unilat With Pelvis 1V Left  Result Date: 09/22/2022 CLINICAL DATA:  Status post left hip arthroplasty. EXAM: DG HIP (WITH OR WITHOUT PELVIS) 1V PORT LEFT; PORTABLE PELVIS 1-2 VIEWS COMPARISON:  None Available. FINDINGS: Left hip arthroplasty in expected alignment. No periprosthetic lucency or fracture. Recent postsurgical change includes air and edema in the soft tissues. IMPRESSION: Left hip arthroplasty without immediate postoperative complication. Electronically Signed   By: Narda Rutherford M.D.   On: 09/22/2022 13:53    Disposition: Discharge disposition: 01-Home or Self Care          Follow-up Information     Teryl Lucy, MD. Go on 10/05/2022.   Specialty: Orthopedic Surgery Why: Your appointment is scheduled for 11:15. Contact information: [address] [place] [address] [phone]         [facility] Go on 09/25/2022.   Specialty: Physical Therapy Why: Your  outpatient physical therapy is scheduled for 11:00 Contact information: [facility] [address] [place] [address] [phone]                  Signed: Armida Sans PA-C 09/23/2022, 11:01 AM

## 2022-09-25 DIAGNOSIS — R269 Unspecified abnormalities of gait and mobility: Secondary | ICD-10-CM | POA: Diagnosis not present

## 2022-09-25 DIAGNOSIS — Z96642 Presence of left artificial hip joint: Secondary | ICD-10-CM | POA: Diagnosis not present

## 2022-09-25 DIAGNOSIS — Z4732 Aftercare following explantation of hip joint prosthesis: Secondary | ICD-10-CM | POA: Diagnosis not present

## 2022-09-28 DIAGNOSIS — M1612 Unilateral primary osteoarthritis, left hip: Secondary | ICD-10-CM | POA: Diagnosis not present

## 2022-09-29 DIAGNOSIS — Z96642 Presence of left artificial hip joint: Secondary | ICD-10-CM | POA: Diagnosis not present

## 2022-09-29 DIAGNOSIS — R269 Unspecified abnormalities of gait and mobility: Secondary | ICD-10-CM | POA: Diagnosis not present

## 2022-09-29 DIAGNOSIS — Z4732 Aftercare following explantation of hip joint prosthesis: Secondary | ICD-10-CM | POA: Diagnosis not present

## 2022-10-01 DIAGNOSIS — Z96642 Presence of left artificial hip joint: Secondary | ICD-10-CM | POA: Diagnosis not present

## 2022-10-01 DIAGNOSIS — Z4732 Aftercare following explantation of hip joint prosthesis: Secondary | ICD-10-CM | POA: Diagnosis not present

## 2022-10-01 DIAGNOSIS — R269 Unspecified abnormalities of gait and mobility: Secondary | ICD-10-CM | POA: Diagnosis not present

## 2022-10-05 DIAGNOSIS — M1612 Unilateral primary osteoarthritis, left hip: Secondary | ICD-10-CM | POA: Diagnosis not present

## 2022-10-06 DIAGNOSIS — Z4732 Aftercare following explantation of hip joint prosthesis: Secondary | ICD-10-CM | POA: Diagnosis not present

## 2022-10-06 DIAGNOSIS — R269 Unspecified abnormalities of gait and mobility: Secondary | ICD-10-CM | POA: Diagnosis not present

## 2022-10-06 DIAGNOSIS — Z96642 Presence of left artificial hip joint: Secondary | ICD-10-CM | POA: Diagnosis not present

## 2022-10-08 DIAGNOSIS — R269 Unspecified abnormalities of gait and mobility: Secondary | ICD-10-CM | POA: Diagnosis not present

## 2022-10-08 DIAGNOSIS — Z4732 Aftercare following explantation of hip joint prosthesis: Secondary | ICD-10-CM | POA: Diagnosis not present

## 2022-10-08 DIAGNOSIS — Z96642 Presence of left artificial hip joint: Secondary | ICD-10-CM | POA: Diagnosis not present

## 2022-10-13 DIAGNOSIS — Z4732 Aftercare following explantation of hip joint prosthesis: Secondary | ICD-10-CM | POA: Diagnosis not present

## 2022-10-13 DIAGNOSIS — R269 Unspecified abnormalities of gait and mobility: Secondary | ICD-10-CM | POA: Diagnosis not present

## 2022-10-13 DIAGNOSIS — Z96642 Presence of left artificial hip joint: Secondary | ICD-10-CM | POA: Diagnosis not present

## 2022-10-16 DIAGNOSIS — Z4732 Aftercare following explantation of hip joint prosthesis: Secondary | ICD-10-CM | POA: Diagnosis not present

## 2022-10-16 DIAGNOSIS — R269 Unspecified abnormalities of gait and mobility: Secondary | ICD-10-CM | POA: Diagnosis not present

## 2022-10-16 DIAGNOSIS — Z96642 Presence of left artificial hip joint: Secondary | ICD-10-CM | POA: Diagnosis not present

## 2022-10-20 DIAGNOSIS — R269 Unspecified abnormalities of gait and mobility: Secondary | ICD-10-CM | POA: Diagnosis not present

## 2022-10-20 DIAGNOSIS — Z4732 Aftercare following explantation of hip joint prosthesis: Secondary | ICD-10-CM | POA: Diagnosis not present

## 2022-10-20 DIAGNOSIS — Z96642 Presence of left artificial hip joint: Secondary | ICD-10-CM | POA: Diagnosis not present

## 2022-10-23 DIAGNOSIS — Z4732 Aftercare following explantation of hip joint prosthesis: Secondary | ICD-10-CM | POA: Diagnosis not present

## 2022-10-23 DIAGNOSIS — R269 Unspecified abnormalities of gait and mobility: Secondary | ICD-10-CM | POA: Diagnosis not present

## 2022-10-23 DIAGNOSIS — Z96642 Presence of left artificial hip joint: Secondary | ICD-10-CM | POA: Diagnosis not present

## 2022-10-26 DIAGNOSIS — Z4732 Aftercare following explantation of hip joint prosthesis: Secondary | ICD-10-CM | POA: Diagnosis not present

## 2022-10-26 DIAGNOSIS — R269 Unspecified abnormalities of gait and mobility: Secondary | ICD-10-CM | POA: Diagnosis not present

## 2022-10-26 DIAGNOSIS — Z96642 Presence of left artificial hip joint: Secondary | ICD-10-CM | POA: Diagnosis not present

## 2022-10-28 DIAGNOSIS — Z4732 Aftercare following explantation of hip joint prosthesis: Secondary | ICD-10-CM | POA: Diagnosis not present

## 2022-10-28 DIAGNOSIS — R269 Unspecified abnormalities of gait and mobility: Secondary | ICD-10-CM | POA: Diagnosis not present

## 2022-10-28 DIAGNOSIS — Z96642 Presence of left artificial hip joint: Secondary | ICD-10-CM | POA: Diagnosis not present

## 2022-11-04 DIAGNOSIS — Z96642 Presence of left artificial hip joint: Secondary | ICD-10-CM | POA: Diagnosis not present

## 2022-11-04 DIAGNOSIS — Z4732 Aftercare following explantation of hip joint prosthesis: Secondary | ICD-10-CM | POA: Diagnosis not present

## 2022-11-04 DIAGNOSIS — R269 Unspecified abnormalities of gait and mobility: Secondary | ICD-10-CM | POA: Diagnosis not present

## 2022-11-04 DIAGNOSIS — M1612 Unilateral primary osteoarthritis, left hip: Secondary | ICD-10-CM | POA: Diagnosis not present

## 2022-11-06 DIAGNOSIS — R269 Unspecified abnormalities of gait and mobility: Secondary | ICD-10-CM | POA: Diagnosis not present

## 2022-11-06 DIAGNOSIS — Z96642 Presence of left artificial hip joint: Secondary | ICD-10-CM | POA: Diagnosis not present

## 2022-11-06 DIAGNOSIS — Z4732 Aftercare following explantation of hip joint prosthesis: Secondary | ICD-10-CM | POA: Diagnosis not present

## 2022-11-09 DIAGNOSIS — R269 Unspecified abnormalities of gait and mobility: Secondary | ICD-10-CM | POA: Diagnosis not present

## 2022-11-09 DIAGNOSIS — Z4732 Aftercare following explantation of hip joint prosthesis: Secondary | ICD-10-CM | POA: Diagnosis not present

## 2022-11-09 DIAGNOSIS — Z96642 Presence of left artificial hip joint: Secondary | ICD-10-CM | POA: Diagnosis not present

## 2022-11-11 DIAGNOSIS — Z4732 Aftercare following explantation of hip joint prosthesis: Secondary | ICD-10-CM | POA: Diagnosis not present

## 2022-11-11 DIAGNOSIS — Z96642 Presence of left artificial hip joint: Secondary | ICD-10-CM | POA: Diagnosis not present

## 2022-11-11 DIAGNOSIS — R269 Unspecified abnormalities of gait and mobility: Secondary | ICD-10-CM | POA: Diagnosis not present

## 2022-11-16 DIAGNOSIS — Z4732 Aftercare following explantation of hip joint prosthesis: Secondary | ICD-10-CM | POA: Diagnosis not present

## 2022-11-16 DIAGNOSIS — R269 Unspecified abnormalities of gait and mobility: Secondary | ICD-10-CM | POA: Diagnosis not present

## 2022-11-16 DIAGNOSIS — Z96642 Presence of left artificial hip joint: Secondary | ICD-10-CM | POA: Diagnosis not present

## 2022-11-18 DIAGNOSIS — R269 Unspecified abnormalities of gait and mobility: Secondary | ICD-10-CM | POA: Diagnosis not present

## 2022-11-18 DIAGNOSIS — Z96642 Presence of left artificial hip joint: Secondary | ICD-10-CM | POA: Diagnosis not present

## 2022-11-18 DIAGNOSIS — Z4732 Aftercare following explantation of hip joint prosthesis: Secondary | ICD-10-CM | POA: Diagnosis not present

## 2022-11-23 DIAGNOSIS — R269 Unspecified abnormalities of gait and mobility: Secondary | ICD-10-CM | POA: Diagnosis not present

## 2022-11-23 DIAGNOSIS — Z4732 Aftercare following explantation of hip joint prosthesis: Secondary | ICD-10-CM | POA: Diagnosis not present

## 2022-11-23 DIAGNOSIS — Z96642 Presence of left artificial hip joint: Secondary | ICD-10-CM | POA: Diagnosis not present

## 2022-11-25 DIAGNOSIS — R269 Unspecified abnormalities of gait and mobility: Secondary | ICD-10-CM | POA: Diagnosis not present

## 2022-11-25 DIAGNOSIS — Z4732 Aftercare following explantation of hip joint prosthesis: Secondary | ICD-10-CM | POA: Diagnosis not present

## 2022-11-25 DIAGNOSIS — Z96642 Presence of left artificial hip joint: Secondary | ICD-10-CM | POA: Diagnosis not present

## 2022-11-30 ENCOUNTER — Other Ambulatory Visit: Payer: Self-pay

## 2022-11-30 ENCOUNTER — Other Ambulatory Visit: Payer: Self-pay | Admitting: Family Medicine

## 2022-11-30 DIAGNOSIS — R269 Unspecified abnormalities of gait and mobility: Secondary | ICD-10-CM | POA: Diagnosis not present

## 2022-11-30 DIAGNOSIS — Z96642 Presence of left artificial hip joint: Secondary | ICD-10-CM | POA: Diagnosis not present

## 2022-11-30 DIAGNOSIS — Z4732 Aftercare following explantation of hip joint prosthesis: Secondary | ICD-10-CM | POA: Diagnosis not present

## 2022-12-02 DIAGNOSIS — M1612 Unilateral primary osteoarthritis, left hip: Secondary | ICD-10-CM | POA: Diagnosis not present

## 2022-12-04 DIAGNOSIS — Z96642 Presence of left artificial hip joint: Secondary | ICD-10-CM | POA: Diagnosis not present

## 2022-12-04 DIAGNOSIS — R269 Unspecified abnormalities of gait and mobility: Secondary | ICD-10-CM | POA: Diagnosis not present

## 2022-12-04 DIAGNOSIS — Z4732 Aftercare following explantation of hip joint prosthesis: Secondary | ICD-10-CM | POA: Diagnosis not present

## 2022-12-11 DIAGNOSIS — Z96642 Presence of left artificial hip joint: Secondary | ICD-10-CM | POA: Diagnosis not present

## 2022-12-11 DIAGNOSIS — Z4732 Aftercare following explantation of hip joint prosthesis: Secondary | ICD-10-CM | POA: Diagnosis not present

## 2022-12-11 DIAGNOSIS — R269 Unspecified abnormalities of gait and mobility: Secondary | ICD-10-CM | POA: Diagnosis not present

## 2022-12-18 DIAGNOSIS — Z96642 Presence of left artificial hip joint: Secondary | ICD-10-CM | POA: Diagnosis not present

## 2022-12-18 DIAGNOSIS — Z4732 Aftercare following explantation of hip joint prosthesis: Secondary | ICD-10-CM | POA: Diagnosis not present

## 2022-12-18 DIAGNOSIS — R269 Unspecified abnormalities of gait and mobility: Secondary | ICD-10-CM | POA: Diagnosis not present

## 2022-12-25 DIAGNOSIS — Z4732 Aftercare following explantation of hip joint prosthesis: Secondary | ICD-10-CM | POA: Diagnosis not present

## 2022-12-25 DIAGNOSIS — R269 Unspecified abnormalities of gait and mobility: Secondary | ICD-10-CM | POA: Diagnosis not present

## 2022-12-25 DIAGNOSIS — Z96642 Presence of left artificial hip joint: Secondary | ICD-10-CM | POA: Diagnosis not present

## 2022-12-30 ENCOUNTER — Encounter: Payer: Self-pay | Admitting: Family Medicine

## 2022-12-30 ENCOUNTER — Ambulatory Visit: Payer: Medicare Other | Admitting: Family Medicine

## 2022-12-30 VITALS — BP 138/73 | HR 59 | Temp 97.5°F | Wt 203.0 lb

## 2022-12-30 DIAGNOSIS — Z7901 Long term (current) use of anticoagulants: Secondary | ICD-10-CM | POA: Diagnosis not present

## 2022-12-30 DIAGNOSIS — I10 Essential (primary) hypertension: Secondary | ICD-10-CM | POA: Diagnosis not present

## 2022-12-30 DIAGNOSIS — N182 Chronic kidney disease, stage 2 (mild): Secondary | ICD-10-CM

## 2022-12-30 DIAGNOSIS — E7800 Pure hypercholesterolemia, unspecified: Secondary | ICD-10-CM | POA: Diagnosis not present

## 2022-12-30 DIAGNOSIS — N2889 Other specified disorders of kidney and ureter: Secondary | ICD-10-CM

## 2022-12-30 DIAGNOSIS — Z86711 Personal history of pulmonary embolism: Secondary | ICD-10-CM | POA: Diagnosis not present

## 2022-12-30 LAB — COMPREHENSIVE METABOLIC PANEL WITH GFR
ALT: 17 U/L (ref 0–53)
AST: 23 U/L (ref 0–37)
Albumin: 4.4 g/dL (ref 3.5–5.2)
Alkaline Phosphatase: 94 U/L (ref 39–117)
BUN: 15 mg/dL (ref 6–23)
CO2: 28 meq/L (ref 19–32)
Calcium: 9.6 mg/dL (ref 8.4–10.5)
Chloride: 102 meq/L (ref 96–112)
Creatinine, Ser: 0.9 mg/dL (ref 0.40–1.50)
GFR: 83.46 mL/min (ref >60.00)
Glucose, Bld: 86 mg/dL (ref 70–99)
Potassium: 4 meq/L (ref 3.5–5.1)
Sodium: 136 meq/L (ref 135–145)
Total Bilirubin: 0.5 mg/dL (ref 0.2–1.2)
Total Protein: 7 g/dL (ref 6.0–8.3)

## 2022-12-30 LAB — CBC
HCT: 44.2 % (ref 39.0–52.0)
Hemoglobin: 14.2 g/dL (ref 13.0–17.0)
MCHC: 32.2 g/dL (ref 30.0–36.0)
MCV: 82.2 fl (ref 78.0–100.0)
Platelets: 171 10*3/uL (ref 150.0–400.0)
RBC: 5.38 Mil/uL (ref 4.22–5.81)
RDW: 15.1 % (ref 11.5–15.5)
WBC: 5.9 10*3/uL (ref 4.0–10.5)

## 2022-12-30 LAB — LIPID PANEL
Cholesterol: 170 mg/dL (ref 0–200)
HDL: 47.6 mg/dL (ref >39.00)
LDL Cholesterol: 98 mg/dL (ref 0–99)
NonHDL: 122.48
Total CHOL/HDL Ratio: 4
Triglycerides: 121 mg/dL (ref 0.0–149.0)
VLDL: 24.2 mg/dL (ref 0.0–40.0)

## 2022-12-30 NOTE — Progress Notes (Signed)
OFFICE VISIT  12/30/2022  CC:  Chief Complaint  Patient presents with   Medical Management of Chronic Issues    Follow up on meds    Patient is a 77 y.o. male who presents for follow-up hypertension, history of PE, chronic renal insufficiency stage II/III, and hypercholesterolemia.  INTERIM HX: He underwent left total hip arthroplasty on 09/22/2022. He feels like his rehab is coming along appropriately. He currently feels well and is without acute complaint.  No home blood pressure monitoring. He is currently taking one half of the 20 mg atorvastatin tablet daily.  ROS as above, plus--> no fevers, no CP, no SOB, no wheezing, no cough, no dizziness, no HAs, no rashes, no melena/hematochezia.  No polyuria or polydipsia.  No myalgias or arthralgias other than ongoing left hip pain that is improving with rehab..  No focal weakness, paresthesias, or tremors.  No acute vision or hearing abnormalities.  No dysuria or unusual/new urinary urgency or frequency.  No recent changes in lower legs. No n/v/d or abd pain.  No palpitations.     Past Medical History:  Diagnosis Date   Acute urinary retention 12/2018   ?BPH   Asymmetrical sensorineural hearing loss 2021   Likely from noise exposure.  [facility] [phone] ENT referral for this dx being relatively new.   Barrett's esophagus    Dr. Loreta Ave   Cataract    Chronic renal insufficiency, stage 2 (mild) 2017   Stage II/III (GFR 60 ml/min)   GERD (gastroesophageal reflux disease)    History of adenomatous polyp of colon 09/2007   Polyps at 1st screening colonoscopy; 2014 no polyps.  Rpt 12/29/17: adenomatous polyp-->  Recall 5 yrs.   Hyperlipemia, mixed    Pt intol of welchol; fenofibrate helpful.  Fenofib inc to 145 and atorva 10mg  started 08/23/18-->great lipid panel 11/2018 (his max tolerable dose of atorva is 10mg )   Hypertension    Lumbar spondylosis    2023 mild   Osteoarthritis of left hip    2023 moderate   Pulmonary  embolism (HCC) 09/2020   with pulm infarct.  Unprovoked->hosp ref to hem/onc.  Pulm recommended anticoag indefinitely.   Subclinical hypothyroidism    per old PCP records.  TPO ab's elevated mildly 12/2017, with TSH 6.6 and normal T4 and T3==>developing Hashimoto's.    Thrombocytopenia (HCC)    Hx of, mild/stable with monitoring per old PCP records    Past Surgical History:  Procedure Laterality Date   APPENDECTOMY     CATARACT EXTRACTION Bilateral 04/13/2018   05/14/2018   COLONOSCOPY  12/29/2017   polypectomy --path - adenoma,  Repeat in 5 years   COLONOSCOPY W/ POLYPECTOMY  2014   per Dr. Loreta Ave, recall 02/2023   ESOPHAGOGASTRODUODENOSCOPY  2014; 2016; 11/16/16   2018--Barrett's esoph reconfirmed   TOTAL HIP ARTHROPLASTY Left 09/22/2022   Procedure: TOTAL HIP ARTHROPLASTY;  Surgeon: Teryl Lucy, MD;  Location: [facility];  Service: Orthopedics;  Laterality: Left;   TRANSTHORACIC ECHOCARDIOGRAM  09/19/2020   (in the setting of acute PE)->EF 70-75%, RV fxn mildly reduced, hyperdynamic LV fxn, normal PA pressure, no valve probs.   UPPER GI ENDOSCOPY  03/03/2019   2020.  03/03/21->small patch Barretts, multiple sessile gastric polyps, normal duodenum. Rpt 3 yrs.    Outpatient Medications Prior to Visit  Medication Sig Dispense Refill   atorvastatin (LIPITOR) 20 MG tablet Take 1 tablet (20 mg total) by mouth daily. (Patient taking differently: Take 10 mg by mouth daily.) 90 tablet 1  ELIQUIS 2.5 MG TABS tablet TAKE 1 TABLET TWICE A DAY (OFFICE VISIT NEEDED FOR FURTHER REFILLS) 180 tablet 3   EPINEPHrine 0.3 mg/0.3 mL IJ SOAJ injection Inject 0.3 mg into the muscle as needed for anaphylaxis. 2 each 1   fosinopril (MONOPRIL) 20 MG tablet TAKE 1 TABLET EVERY MORNING 90 tablet 1   hydrochlorothiazide (HYDRODIURIL) 25 MG tablet TAKE ONE-HALF (1/2) TABLET DAILY 45 tablet 0   omeprazole (PRILOSEC) 40 MG capsule Take 40 mg by mouth daily.     Polyethyl Glycol-Propyl Glycol (SYSTANE OP) Place 1  drop into both eyes as needed (dry eyes).     methocarbamol (ROBAXIN-750) 750 MG tablet Take 1 tablet (750 mg total) by mouth every 8 (eight) hours as needed for muscle spasms. 20 tablet 0   ondansetron (ZOFRAN) 4 MG tablet Take 1 tablet (4 mg total) by mouth every 8 (eight) hours as needed for nausea or vomiting. 10 tablet 0   sennosides-docusate sodium (SENOKOT-S) 8.6-50 MG tablet Take 2 tablets by mouth daily as needed for constipation. 30 tablet 0   No facility-administered medications prior to visit.    No Known Allergies  Review of Systems As per HPI  PE:    12/30/2022    9:10 AM 09/23/2022    9:28 AM 09/23/2022    5:41 AM  Vitals with BMI  Weight 203 lbs    Systolic 138 135 782  Diastolic 73 75 78  Pulse 59 68 65     Physical Exam  Gen: Alert, well appearing.  Patient is oriented to person, place, time, and situation. AFFECT: pleasant, lucid thought and speech. No further exam today.  LABS:  Last CBC Lab Results  Component Value Date   WBC 10.0 09/23/2022   HGB 11.3 (L) 09/23/2022   HCT 34.0 (L) 09/23/2022   MCV 82.7 09/23/2022   MCH 27.5 09/23/2022   RDW 14.8 09/23/2022   PLT 160 09/23/2022   Last metabolic panel Lab Results  Component Value Date   GLUCOSE 133 (H) 09/23/2022   NA 130 (L) 09/23/2022   K 3.9 09/23/2022   CL 99 09/23/2022   CO2 23 09/23/2022   BUN 18 09/23/2022   CREATININE 0.81 09/23/2022   GFRNONAA >60 09/23/2022   CALCIUM 8.6 (L) 09/23/2022   PROT 7.6 07/07/2022   ALBUMIN 4.6 07/07/2022   BILITOT 0.7 07/07/2022   ALKPHOS 81 07/07/2022   AST 23 07/07/2022   ALT 19 07/07/2022   ANIONGAP 8 09/23/2022   Last lipids Lab Results  Component Value Date   CHOL 183 07/13/2022   HDL 47.40 07/13/2022   LDLCALC 115 (H) 07/13/2022   TRIG 103.0 07/13/2022   CHOLHDL 4 07/13/2022   Last thyroid functions Lab Results  Component Value Date   TSH 5.68 (H) 07/13/2022   T3TOTAL 94 07/13/2022   IMPRESSION AND PLAN:  #1 hypertension,  well-controlled on Monopril 20 mg a day and one half of a 25 mg HCTZ tab daily. Electrolytes and creatinine monitoring today.  2.  Hypercholesterolemia, doing well on one half of the 20 mg atorvastatin tab daily. If lipids are at goal today then we will send in prescription for a atorvastatin 10 mg tabs to take 1 daily.  If not at goal we will have him go up to a whole 20 mg tab daily.  3.  History of pulmonary embolus. On Eliquis 2.5 mg twice daily indefinitely. His postop hemoglobin in June was down a little bit to 11.3. Monitor CBC today.  #  4 chronic renal insufficiency stage II/III. Avoiding NSAIDs. Monitor renal function today.  #5 preventative health care: Vaccines: He declines shingrix and Tdap.  Flu->declined. Colon ca screening: needs is scheduled for his 5-year repeat colonoscopy in 5 days. Prostate ca screening: he declines any further PSA screening.  An After Visit Summary was printed and given to the patient.  FOLLOW UP: No follow-ups on file.  Signed:  Santiago Bumpers, MD           12/30/2022

## 2023-01-01 DIAGNOSIS — R269 Unspecified abnormalities of gait and mobility: Secondary | ICD-10-CM | POA: Diagnosis not present

## 2023-01-01 DIAGNOSIS — Z96642 Presence of left artificial hip joint: Secondary | ICD-10-CM | POA: Diagnosis not present

## 2023-01-01 DIAGNOSIS — Z4732 Aftercare following explantation of hip joint prosthesis: Secondary | ICD-10-CM | POA: Diagnosis not present

## 2023-01-04 ENCOUNTER — Encounter: Payer: Self-pay | Admitting: Family Medicine

## 2023-01-04 DIAGNOSIS — Z86010 Personal history of colonic polyps: Secondary | ICD-10-CM | POA: Diagnosis not present

## 2023-01-04 DIAGNOSIS — K621 Rectal polyp: Secondary | ICD-10-CM | POA: Diagnosis not present

## 2023-01-04 DIAGNOSIS — K648 Other hemorrhoids: Secondary | ICD-10-CM | POA: Diagnosis not present

## 2023-01-04 DIAGNOSIS — Z1211 Encounter for screening for malignant neoplasm of colon: Secondary | ICD-10-CM | POA: Diagnosis not present

## 2023-01-04 DIAGNOSIS — Q438 Other specified congenital malformations of intestine: Secondary | ICD-10-CM | POA: Diagnosis not present

## 2023-01-04 DIAGNOSIS — D128 Benign neoplasm of rectum: Secondary | ICD-10-CM | POA: Diagnosis not present

## 2023-01-04 LAB — HM COLONOSCOPY

## 2023-01-04 MED ORDER — ATORVASTATIN CALCIUM 10 MG PO TABS: 10.0000 mg | ORAL_TABLET | Freq: Every day | ORAL | 3 refills | Status: DC

## 2023-01-04 NOTE — Telephone Encounter (Signed)
Ok, atorvastatin 10mg  sent to [facility] [place]

## 2023-01-05 ENCOUNTER — Encounter: Payer: Self-pay | Admitting: Family Medicine

## 2023-01-05 ENCOUNTER — Other Ambulatory Visit: Payer: Self-pay

## 2023-01-05 MED ORDER — ATORVASTATIN CALCIUM 10 MG PO TABS: 10.0000 mg | ORAL_TABLET | Freq: Every day | ORAL | 3 refills | Status: DC

## 2023-01-07 ENCOUNTER — Other Ambulatory Visit: Payer: Self-pay | Admitting: Hematology and Oncology

## 2023-01-07 ENCOUNTER — Inpatient Hospital Stay: Payer: Medicare Other | Attending: Hematology and Oncology

## 2023-01-07 ENCOUNTER — Inpatient Hospital Stay (HOSPITAL_BASED_OUTPATIENT_CLINIC_OR_DEPARTMENT_OTHER): Payer: Medicare Other | Admitting: Hematology and Oncology

## 2023-01-07 VITALS — BP 113/63 | HR 67 | Temp 98.0°F | Resp 13

## 2023-01-07 DIAGNOSIS — I129 Hypertensive chronic kidney disease with stage 1 through stage 4 chronic kidney disease, or unspecified chronic kidney disease: Secondary | ICD-10-CM | POA: Insufficient documentation

## 2023-01-07 DIAGNOSIS — I2699 Other pulmonary embolism without acute cor pulmonale: Secondary | ICD-10-CM

## 2023-01-07 DIAGNOSIS — Z8261 Family history of arthritis: Secondary | ICD-10-CM | POA: Diagnosis not present

## 2023-01-07 DIAGNOSIS — Z86711 Personal history of pulmonary embolism: Secondary | ICD-10-CM

## 2023-01-07 DIAGNOSIS — R1010 Upper abdominal pain, unspecified: Secondary | ICD-10-CM | POA: Insufficient documentation

## 2023-01-07 DIAGNOSIS — Z833 Family history of diabetes mellitus: Secondary | ICD-10-CM | POA: Diagnosis not present

## 2023-01-07 DIAGNOSIS — Z8719 Personal history of other diseases of the digestive system: Secondary | ICD-10-CM | POA: Insufficient documentation

## 2023-01-07 DIAGNOSIS — Z86010 Personal history of colonic polyps: Secondary | ICD-10-CM | POA: Diagnosis not present

## 2023-01-07 DIAGNOSIS — E063 Autoimmune thyroiditis: Secondary | ICD-10-CM | POA: Diagnosis not present

## 2023-01-07 DIAGNOSIS — Z23 Encounter for immunization: Secondary | ICD-10-CM | POA: Diagnosis not present

## 2023-01-07 DIAGNOSIS — R072 Precordial pain: Secondary | ICD-10-CM | POA: Insufficient documentation

## 2023-01-07 DIAGNOSIS — Z79899 Other long term (current) drug therapy: Secondary | ICD-10-CM | POA: Diagnosis not present

## 2023-01-07 DIAGNOSIS — Z8249 Family history of ischemic heart disease and other diseases of the circulatory system: Secondary | ICD-10-CM | POA: Diagnosis not present

## 2023-01-07 DIAGNOSIS — N182 Chronic kidney disease, stage 2 (mild): Secondary | ICD-10-CM | POA: Diagnosis not present

## 2023-01-07 DIAGNOSIS — Z83438 Family history of other disorder of lipoprotein metabolism and other lipidemia: Secondary | ICD-10-CM | POA: Insufficient documentation

## 2023-01-07 DIAGNOSIS — R768 Other specified abnormal immunological findings in serum: Secondary | ICD-10-CM | POA: Diagnosis not present

## 2023-01-07 DIAGNOSIS — Z7901 Long term (current) use of anticoagulants: Secondary | ICD-10-CM | POA: Diagnosis not present

## 2023-01-07 DIAGNOSIS — Z9049 Acquired absence of other specified parts of digestive tract: Secondary | ICD-10-CM | POA: Insufficient documentation

## 2023-01-07 LAB — CBC WITH DIFFERENTIAL (CANCER CENTER ONLY)
Abs Immature Granulocytes: 0.01 10*3/uL (ref 0.00–0.07)
Basophils Absolute: 0 10*3/uL (ref 0.0–0.1)
Basophils Relative: 1 %
Eosinophils Absolute: 0.1 10*3/uL (ref 0.0–0.5)
Eosinophils Relative: 2 %
HCT: 42.8 % (ref 39.0–52.0)
Hemoglobin: 14.3 g/dL (ref 13.0–17.0)
Immature Granulocytes: 0 %
Lymphocytes Relative: 30 %
Lymphs Abs: 1.9 10*3/uL (ref 0.7–4.0)
MCH: 26.4 pg (ref 26.0–34.0)
MCHC: 33.4 g/dL (ref 30.0–36.0)
MCV: 79 fL — ABNORMAL LOW (ref 80.0–100.0)
Monocytes Absolute: 0.8 10*3/uL (ref 0.1–1.0)
Monocytes Relative: 12 %
Neutro Abs: 3.5 10*3/uL (ref 1.7–7.7)
Neutrophils Relative %: 55 %
Platelet Count: 171 10*3/uL (ref 150–400)
RBC: 5.42 MIL/uL (ref 4.22–5.81)
RDW: 15 % (ref 11.5–15.5)
WBC Count: 6.3 10*3/uL (ref 4.0–10.5)
nRBC: 0 % (ref 0.0–0.2)

## 2023-01-07 LAB — CMP (CANCER CENTER ONLY)
ALT: 19 U/L (ref 0–44)
AST: 24 U/L (ref 15–41)
Albumin: 4.3 g/dL (ref 3.5–5.0)
Alkaline Phosphatase: 94 U/L (ref 38–126)
Anion gap: 7 (ref 5–15)
BUN: 14 mg/dL (ref 8–23)
CO2: 25 mmol/L (ref 22–32)
Calcium: 9.8 mg/dL (ref 8.9–10.3)
Chloride: 103 mmol/L (ref 98–111)
Creatinine: 0.95 mg/dL (ref 0.61–1.24)
GFR, Estimated: >60 mL/min (ref >60)
Glucose, Bld: 87 mg/dL (ref 70–99)
Potassium: 4.2 mmol/L (ref 3.5–5.1)
Sodium: 135 mmol/L (ref 135–145)
Total Bilirubin: 0.5 mg/dL (ref 0.3–1.2)
Total Protein: 7.6 g/dL (ref 6.5–8.1)

## 2023-01-07 NOTE — Progress Notes (Signed)
[facility] [phone]   [phone]  PROGRESS NOTE  Patient Care Team: Shawn Massed, MD as PCP - General (Family Medicine) Shawn Elizabeth, MD as Consulting Physician (Gastroenterology) Shawn [name], DC as Referring Physician (Chiropractic Medicine) Shawn Box, MD as Referring Physician (Dermatology) Shawn Martinez, MD as Consulting Physician (Urology)  Hematological/Oncological History # Hx of unprovoked pulmonary emboli  1) 09/18/2020-09/21/2020: Admitted for acute pulmonary emboli after presenting to the ED shortness of breath, sternal chest pain and upper abdominal pain. CTA chest confirmed multi segment pulmonary emboli in the right lower lobe with pulmonary infarct/ischemia. No evidence of heart strain. Patient was started on heparin and transitioned to Eliquis.   2) 10/16/2020: Establish care with Shawn Kaufmann PA-C. Recommended indefinite anticoagulation due to unprovoked PE.   3) 04/18/2021: Recommend to transition to maintenance dose of Eliquis 2.5 mg twice daily.   HISTORY OF PRESENTING ILLNESS:  Shawn [name] 77 y.o. male returns for follow-up for history of unprovoked pulmonary emboli currently on indefinite anticoagulation with Eliquis. He is unaccompanied for this visit.  At today's visit, Shawn [name] reports he has been well overall in the interim since our last visit.  He reports that he did have his hip surgery in June 2024 but developed a large hematoma running from his hip down to his toes.  He reports that he was told by his surgeon that it is "quite common".  He notes that he feels like he is 100% healed since that time.  He notes that he has been tolerating his Eliquis well otherwise with no bleeding, bruising, or dark stools.  He notes he is not having any signs or symptoms concerning for recurrent VTE such as chest pain, leg swelling, shortness of breath or chest pain.  He notes that the medication does not cost him more than  $25-$30 per month.  Overall he is willing and able to continue on Eliquis therapy at this time and has no questions, concerns, or complaints.  He denies any fevers, chills, shortness of breath, chest pain, cough, nausea, vomiting, diarrhea or constipation. Rest of the 10 point ROS is below.  MEDICAL HISTORY:  Past Medical History:  Diagnosis Date   Acute urinary retention 12/2018   ?BPH   Asymmetrical sensorineural hearing loss 2021   Likely from noise exposure.  [facility] [phone] ENT referral for this dx being relatively new.   Barrett's esophagus    Dr. Loreta Ave   Cataract    Chronic renal insufficiency, stage 2 (mild) 2017   Stage II/III (GFR 60 ml/min)   GERD (gastroesophageal reflux disease)    History of adenomatous polyp of colon 09/2007   Polyps at 1st screening colonoscopy; 2014 no polyps.  Rpt 12/29/17: adenomatous polyp-->  Recall 5 yrs.   Hyperlipemia, mixed    Pt intol of welchol; fenofibrate helpful.  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  Unprovoked->hosp ref to hem/onc.  Pulm recommended anticoag indefinitely.   Subclinical hypothyroidism    per old PCP records.  TPO ab's elevated mildly 12/2017, with TSH 6.6 and normal T4 and T3==>developing Hashimoto's.    Thrombocytopenia (HCC)    Hx of, mild/stable with monitoring per old PCP records  SURGICAL HISTORY: Past Surgical History:  Procedure Laterality Date   APPENDECTOMY     CATARACT EXTRACTION Bilateral 04/13/2018   05/14/2018   COLONOSCOPY  12/29/2017   2019 adenoma,  12/2022 polyp x 1   COLONOSCOPY W/ POLYPECTOMY  2014   per Dr. Loreta Ave, recall 02/2023   ESOPHAGOGASTRODUODENOSCOPY  2014; 2016; 11/16/16   2018--Barrett's esoph reconfirmed   TOTAL HIP ARTHROPLASTY Left  09/22/2022   Procedure: TOTAL HIP ARTHROPLASTY;  Surgeon: Shawn Lucy, MD;  Location: [facility];  Service: Orthopedics;  Laterality: Left;   TRANSTHORACIC ECHOCARDIOGRAM  09/19/2020   (in the setting of acute PE)->EF 70-75%, RV fxn mildly reduced, hyperdynamic LV fxn, normal PA pressure, no valve probs.   UPPER GI ENDOSCOPY  03/03/2019   2020.  03/03/21->small patch Barretts, multiple sessile gastric polyps, normal duodenum. Rpt 3 yrs.    SOCIAL HISTORY: Social History   Socioeconomic History   Marital status: Married    Spouse name: Not on file   Number of children: Not on file   Years of education: Not on file   Highest education level: Master's degree (e.g., MA, MS, MEng, MEd, MSW, MBA)  Occupational History   Not on file  Tobacco Use   Smoking status: Never   Smokeless tobacco: Never  Vaping Use   Vaping status: Never Used  Substance and Sexual Activity   Alcohol use: No   Drug use: No   Sexual activity: Not on file  Other Topics Concern   Not on file  Social History Narrative   Married, grand-daughter lives with him.   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   Orig from [place] and [place].   No T/A/Ds.   Exercises 3 X/week--walking 2-3 miles.   Social Determinants of Health   Financial Resource Strain: Low Risk  (08/19/2022)   Received from [facility], [facility]   Overall Financial Resource Strain (CARDIA)    Difficulty of Paying Living Expenses: Not hard at all  Food Insecurity: No Food Insecurity (09/22/2022)   Hunger Vital Sign    Worried About Running Out of Food in the Last Year: Never true    Ran Out of Food in the Last Year: Never true  Transportation Needs: No Transportation Needs (09/22/2022)   PRAPARE - Administrator, Civil Service (Medical): No    Lack of Transportation (Non-Medical): No  Physical Activity: Insufficiently Active (08/19/2022)   Received from [facility], [facility]   Exercise Vital  Sign    Days of Exercise per Week: 7 days    Minutes of Exercise per Session: 20 min  Stress: No Stress Concern Present (08/19/2022)   Received from [facility], [facility] of Occupational Health - Occupational Stress Questionnaire    Feeling of Stress : Not at all  Social Connections: Socially Integrated (08/19/2022)   Received from [facility], [facility]   Social Network    How would you rate your social network (family, work, friends)?: Good participation with social networks  Intimate Partner Violence: Not At Risk (09/22/2022)   Humiliation, Afraid, Rape, and Kick questionnaire    Fear of Current or Ex-Partner: No    Emotionally Abused: No    Physically Abused: No    Sexually Abused: No    FAMILY HISTORY: Family History  Problem Relation Age of Onset   Hypertension Mother    Hyperlipidemia Mother    Arthritis Father    Diabetes Paternal Grandmother 76  ALLERGIES:  has No Known Allergies.  MEDICATIONS:  Current Outpatient Medications  Medication Sig Dispense Refill   atorvastatin (LIPITOR) 10 MG tablet Take 1 tablet (10 mg total) by mouth daily. 90 tablet 3   ELIQUIS 2.5 MG TABS tablet TAKE 1 TABLET TWICE A DAY (OFFICE VISIT NEEDED FOR FURTHER REFILLS) 180 tablet 3   EPINEPHrine 0.3 mg/0.3 mL IJ SOAJ injection Inject 0.3 mg into the muscle as needed for anaphylaxis. 2 each 1   fosinopril (MONOPRIL) 20 MG tablet TAKE 1 TABLET EVERY MORNING 90 tablet 1   hydrochlorothiazide (HYDRODIURIL) 25 MG tablet TAKE ONE-HALF (1/2) TABLET DAILY 45 tablet 0   omeprazole (PRILOSEC) 40 MG capsule Take 40 mg by mouth daily.     Polyethyl Glycol-Propyl Glycol (SYSTANE OP) Place 1 drop into both eyes as needed (dry eyes).     No current facility-administered medications for this visit.    REVIEW OF SYSTEMS:   Constitutional: ( - ) fevers, ( - )  chills , ( - ) night sweats Eyes: ( - ) blurriness of vision, ( - ) double vision, ( - ) watery eyes Ears, nose,  mouth, throat, and face: ( - ) mucositis, ( - ) sore throat Respiratory: ( - ) cough, ( - ) dyspnea, ( - ) wheezes Cardiovascular: ( - ) palpitation, ( - ) chest discomfort, ( - ) lower extremity swelling Gastrointestinal:  ( - ) nausea, ( - ) heartburn, ( - ) change in bowel habits Skin: ( + ) abnormal skin rashes Lymphatics: ( - ) new lymphadenopathy, ( - ) easy bruising Neurological: ( - ) numbness, ( - ) tingling, ( - ) new weaknesses Behavioral/Psych: ( - ) mood change, ( - ) new changes  All other systems were reviewed with the patient and are negative.  PHYSICAL EXAMINATION: ECOG PERFORMANCE STATUS: 0 - Asymptomatic  Vitals:   01/07/23 1014  BP: 113/63  Pulse: 67  Resp: 13  Temp: 98 F (36.7 C)  SpO2: 97%     There were no vitals filed for this visit.    GENERAL: well appearing male in NAD  SKIN: skin color, texture, turgor are normal, no rashes or significant lesions EYES: conjunctiva are pink and non-injected, sclera clear OROPHARYNX: no exudate, no erythema; lips, buccal mucosa, and tongue normal  LUNGS: clear to auscultation and percussion with normal breathing effort HEART: regular rate & rhythm and no murmurs and no lower extremity edema Musculoskeletal: no cyanosis of digits and no clubbing  PSYCH: alert & oriented x 3, fluent speech NEURO: no focal motor/sensory deficits  LABORATORY DATA:  I have reviewed the data as listed    Latest Ref Rng & Units 01/07/2023    9:51 AM 12/30/2022    9:36 AM 09/23/2022    3:24 AM  CBC  WBC 4.0 - 10.5 K/uL 6.3  5.9  10.0   Hemoglobin 13.0 - 17.0 g/dL 91.4  78.2  95.6   Hematocrit 39.0 - 52.0 % 42.8  44.2  34.0   Platelets 150 - 400 K/uL 171  171.0  160        Latest Ref Rng & Units 01/07/2023    9:51 AM 12/30/2022    9:36 AM 09/23/2022    3:24 AM  CMP  Glucose 70 - 99 mg/dL 87  86  213   BUN 8 - 23 mg/dL 14  15  18    Creatinine 0.61 - 1.24 mg/dL 0.86  5.78  4.69   Sodium 135 -  145 mmol/L 135  136  130   Potassium  3.5 - 5.1 mmol/L 4.2  4.0  3.9   Chloride 98 - 111 mmol/L 103  102  99   CO2 22 - 32 mmol/L 25  28  23    Calcium 8.9 - 10.3 mg/dL 9.8  9.6  8.6   Total Protein 6.5 - 8.1 g/dL 7.6  7.0    Total Bilirubin 0.3 - 1.2 mg/dL 0.5  0.5    Alkaline Phos 38 - 126 U/L 94  94    AST 15 - 41 U/L 24  23    ALT 0 - 44 U/L 19  17      RADIOGRAPHIC STUDIES: I have personally reviewed the radiological images as listed and agreed with the findings in the report. No results found.  ASSESSMENT & PLAN LELAND POUCH is a 77 y.o. male returns for routine follow-up for history of pulmonary emboli.  # H/O pulmonary emboli involving the right lower lobe: -Unprovoked VTE so recommendation is indefinite anticoagulation. -Labs from 10/16/2020 showed mild elevation of anticardiolipin IgG and IgM antibodies and normal beta-2 glycoprotein antibodies.Does not meet criteria for antiphospholipid syndrome.  -Currently on Eliquis maintenance 2.5 mg twice daily. Denies easy bruising or signs of active bleeding.  -Labs today show white blood cell count 6.3, hemoglobin 14.3, MCV 79, and platelets of 171.  Creatinine and LFTs normal.  Labs adequate for continued Eliquis treatment. -Recommend to hold Eliquis 2 days before planned surgery/dental work and resume after 24 hours once hemostasis is achieved.  -RTC in 6 months with labs to monitor.   No orders of the defined types were placed in this encounter.   All questions were answered. The patient knows to call the clinic with any problems, questions or concerns.  I have spent a total of 25 minutes minutes of face-to-face and non-face-to-face time, preparing to see the patient, performing a medically appropriate examination, counseling and educating the patient, documenting clinical information in the electronic health record, and care coordination.   Ulysees Barns, MD Department of Hematology/Oncology [facility] at [facility] Phone:  [phone] Pager: [phone] Email: [email or web address]

## 2023-01-18 ENCOUNTER — Other Ambulatory Visit: Payer: Self-pay | Admitting: Family Medicine

## 2023-01-24 ENCOUNTER — Other Ambulatory Visit: Payer: Self-pay | Admitting: Family Medicine

## 2023-01-26 ENCOUNTER — Encounter: Payer: Self-pay | Admitting: Family Medicine

## 2023-01-26 MED ORDER — BENAZEPRIL HCL 20 MG PO TABS: 20.0000 mg | ORAL_TABLET | Freq: Every day | ORAL | 3 refills | Status: DC

## 2023-01-26 NOTE — Telephone Encounter (Signed)
I do not know of any problems with supply for fosinopril. However, I sent an alternative today called benazepril.

## 2023-03-01 ENCOUNTER — Other Ambulatory Visit: Payer: Self-pay | Admitting: Family Medicine

## 2023-03-03 ENCOUNTER — Encounter: Payer: Self-pay | Admitting: Family Medicine

## 2023-03-04 NOTE — Telephone Encounter (Signed)
No further action needed at this time.

## 2023-05-11 IMAGING — CT CT ANGIO CHEST
3 of 7 series · 18 of 36 positions shown · IV contrast (omnipaque)
Comparison: Chest CT from yesterday

CLINICAL DATA: Epigastric and right upper quadrant pain abnormal
chest x-ray

EXAM:
CT ANGIOGRAPHY CHEST WITH CONTRAST
TECHNIQUE: Multidetector CT imaging of the chest was performed using the
standard protocol during bolus administration of intravenous
contrast. Multiplanar CT image reconstructions and MIPs were
obtained to evaluate the vascular anatomy.
CONTRAST:  100mL OMNIPAQUE IOHEXOL 350 MG/ML SOLN

[Series 5: thins · axial · 0.72mm/px · z∈[+1364,+1574]mm · 12 of 250 slices shown]
[im 20/250  lung]
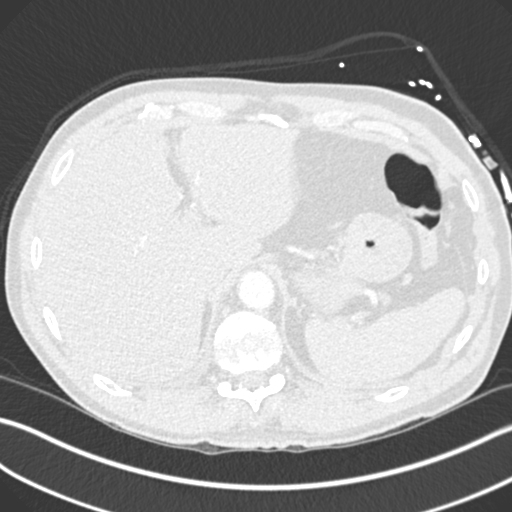
[im 39/250  mediastinal]
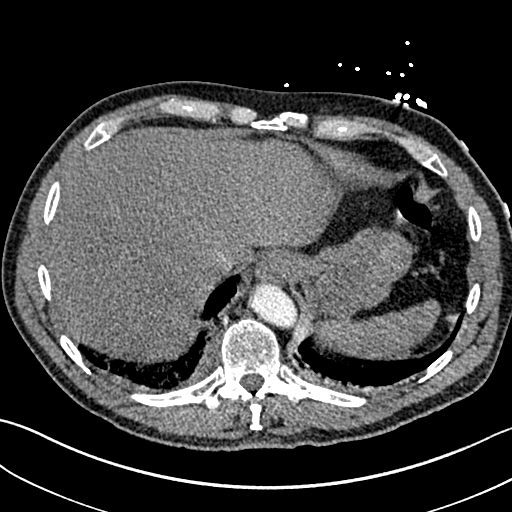
[im 58/250  lung]
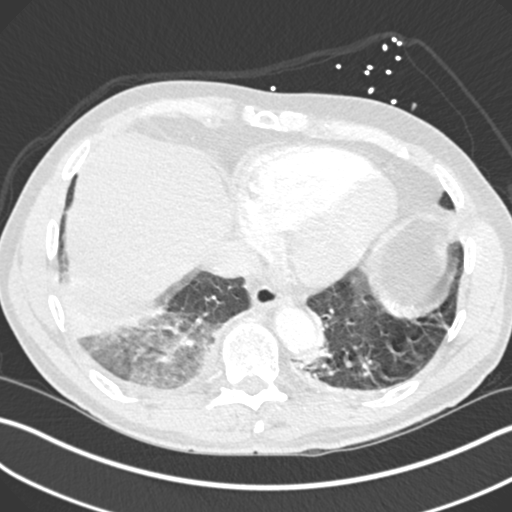
[im 77/250  mediastinal]
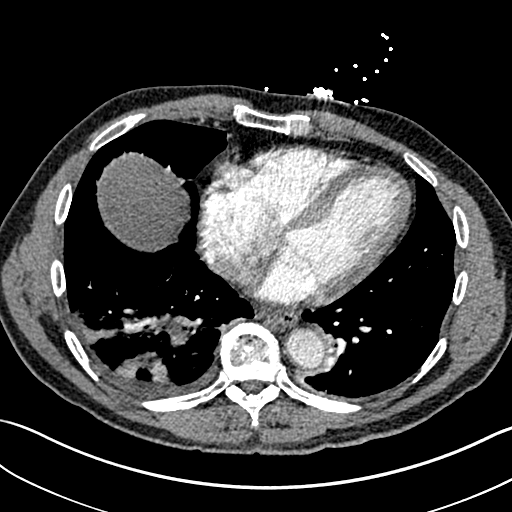
[im 96/250  lung]
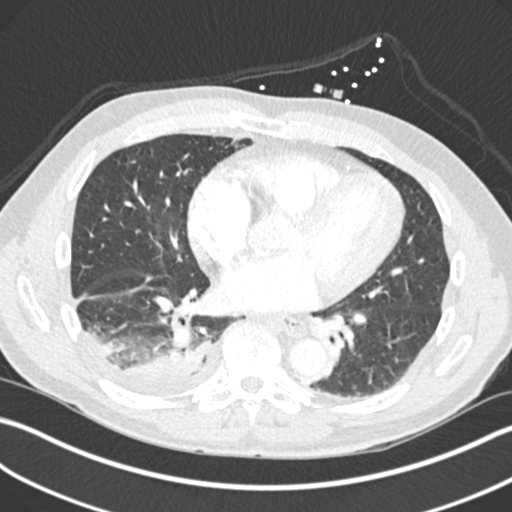
[im 115/250  mediastinal]
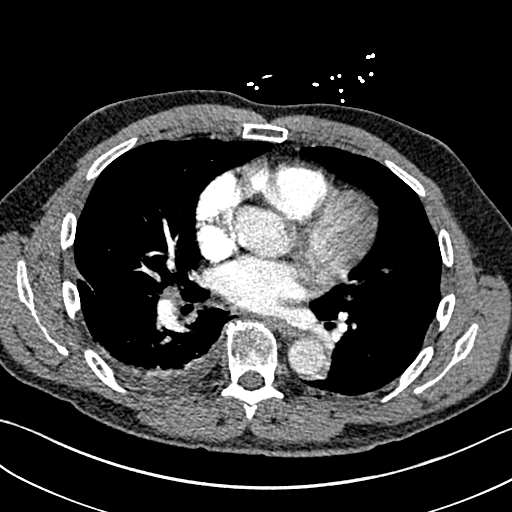
[im 135/250  lung]
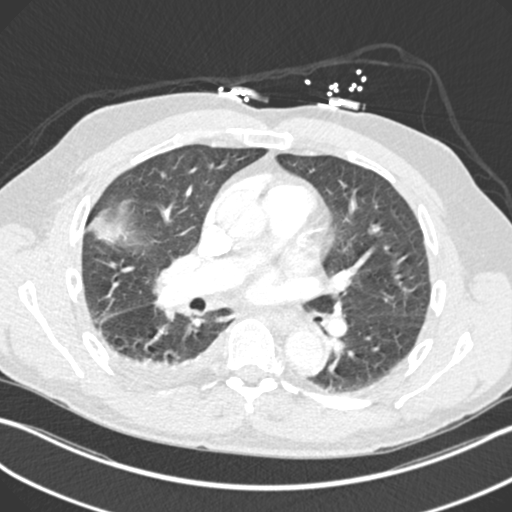
[im 154/250  mediastinal]
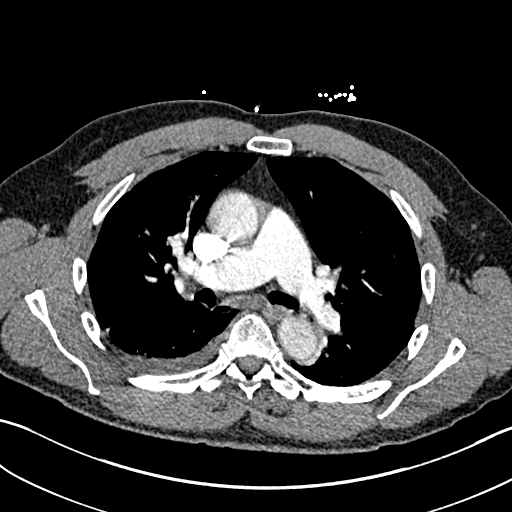
[im 173/250  lung]
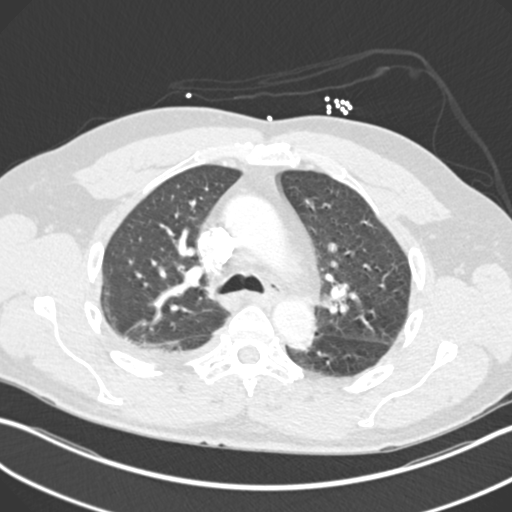
[im 192/250  mediastinal]
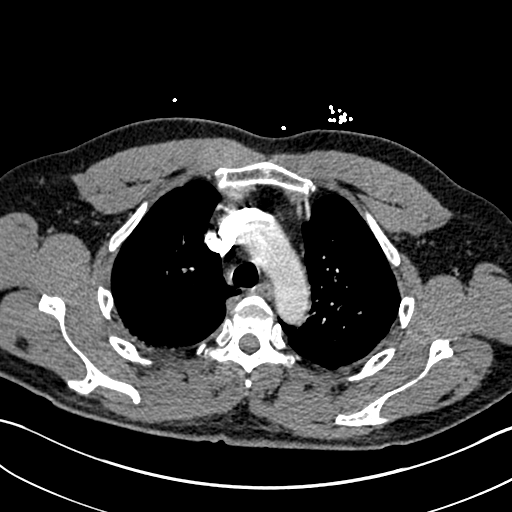
[im 211/250  lung]
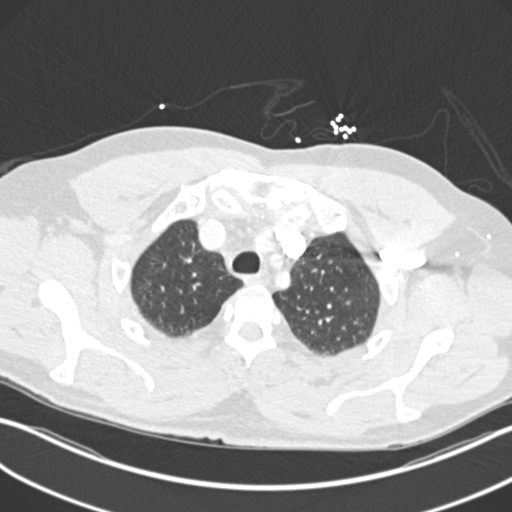
[im 230/250  mediastinal]
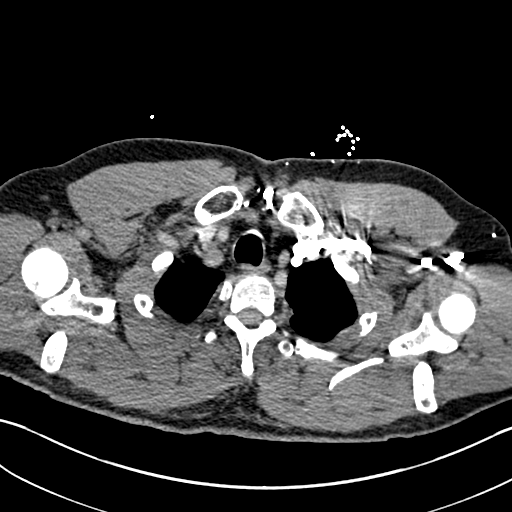

[Series 6: lung · axial · 0.72mm/px · z∈[+1398,+1554]mm · 5 of 118 slices shown]
[im 20/118  mediastinal]
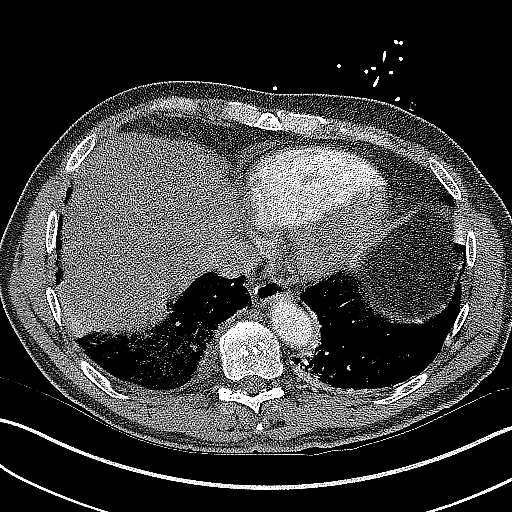
[im 40/118  mediastinal]
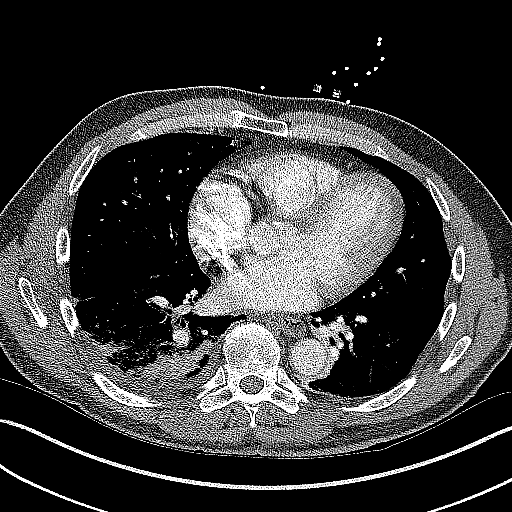
[im 59/118  mediastinal]
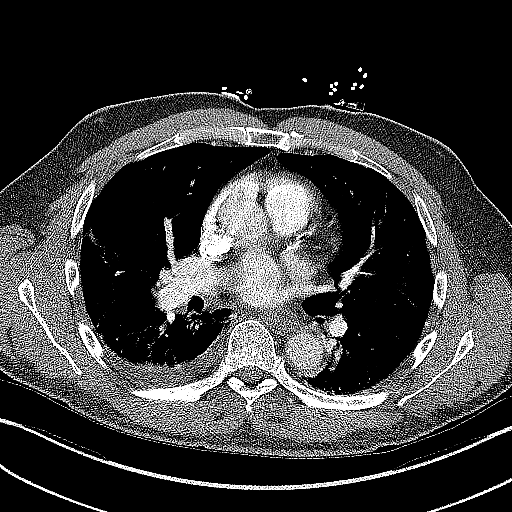
[im 79/118  mediastinal]
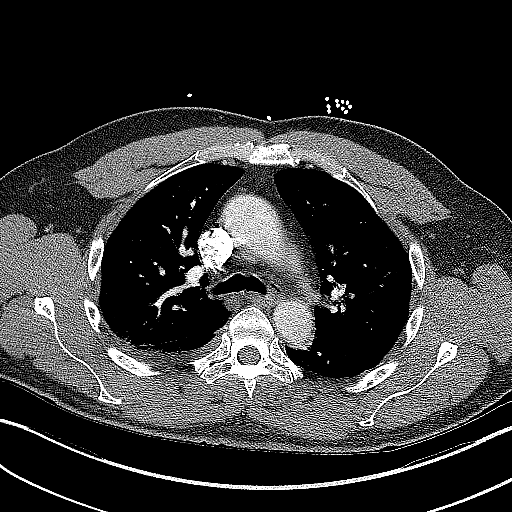
[im 98/118  mediastinal]
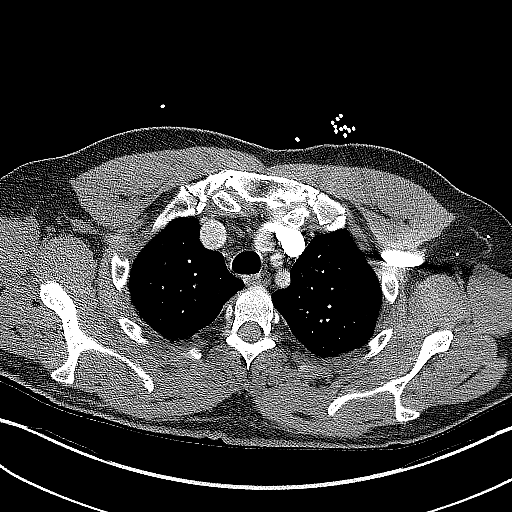

[Series 7: coronal mpr · coronal · 0.49mm/px · 1 of 130 slices shown]
[im 65/130  mediastinal]
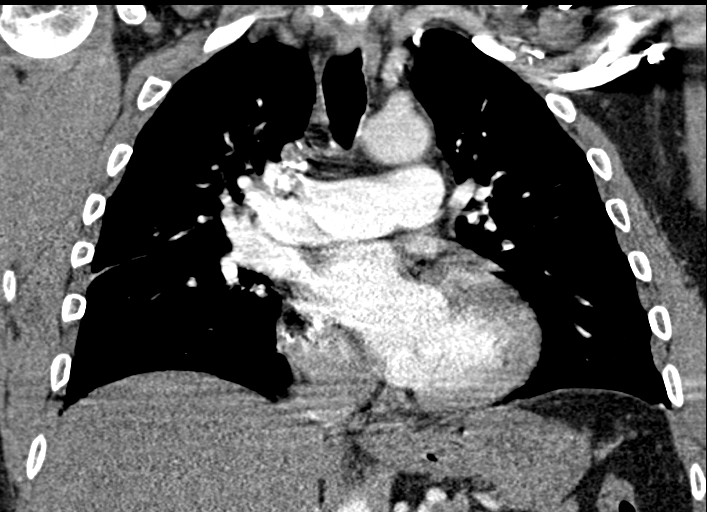

[18 of 36 positions shown; findings below may reference images not displayed]

FINDINGS: Cardiovascular: Multiple branching segmental size pulmonary artery
filling defects in the right lower lobe. No right heart strain by RV
to LV ratio. Normal heart size. No pericardial effusion.

Mediastinum/Nodes: Negative for adenopathy or mass.

Lungs/Pleura: Ground-glass opacity and streaky density with volume
loss in the right lower lobe which is considered ischemic. There is
a small right pleural effusion considered sympathetic. Mild left
lower lobe atelectasis. No pulmonary edema.

Upper Abdomen: Negative

Musculoskeletal: Negative

Review of the MIP images confirms the above findings.

Critical Value/emergent results were called by telephone at the time
of interpretation on 09/19/2020 at [DATE] to provider Dr Biggers, who
verbally acknowledged these results.
IMPRESSION: Multi segment pulmonary emboli in the right lower lobe with
pulmonary infarct/ischemia.

## 2023-06-04 ENCOUNTER — Encounter: Payer: Self-pay | Admitting: Family Medicine

## 2023-06-14 ENCOUNTER — Other Ambulatory Visit: Payer: Self-pay | Admitting: Family Medicine

## 2023-06-14 ENCOUNTER — Other Ambulatory Visit: Payer: Self-pay

## 2023-06-14 MED ORDER — APIXABAN 2.5 MG PO TABS: 2.5000 mg | ORAL_TABLET | Freq: Two times a day (BID) | ORAL | 3 refills | Status: AC

## 2023-07-05 ENCOUNTER — Other Ambulatory Visit: Payer: Self-pay | Admitting: Physician Assistant

## 2023-07-05 DIAGNOSIS — Z86711 Personal history of pulmonary embolism: Secondary | ICD-10-CM

## 2023-07-07 ENCOUNTER — Inpatient Hospital Stay: Payer: TRICARE For Life (TFL) | Attending: Physician Assistant

## 2023-07-07 ENCOUNTER — Inpatient Hospital Stay (HOSPITAL_BASED_OUTPATIENT_CLINIC_OR_DEPARTMENT_OTHER): Payer: TRICARE For Life (TFL) | Admitting: Physician Assistant

## 2023-07-07 VITALS — BP 142/75 | HR 62 | Temp 97.5°F | Resp 18 | Ht 70.0 in | Wt 208.5 lb

## 2023-07-07 DIAGNOSIS — Z860101 Personal history of adenomatous and serrated colon polyps: Secondary | ICD-10-CM | POA: Insufficient documentation

## 2023-07-07 DIAGNOSIS — Z833 Family history of diabetes mellitus: Secondary | ICD-10-CM | POA: Diagnosis not present

## 2023-07-07 DIAGNOSIS — R768 Other specified abnormal immunological findings in serum: Secondary | ICD-10-CM | POA: Diagnosis not present

## 2023-07-07 DIAGNOSIS — Z83438 Family history of other disorder of lipoprotein metabolism and other lipidemia: Secondary | ICD-10-CM | POA: Insufficient documentation

## 2023-07-07 DIAGNOSIS — E038 Other specified hypothyroidism: Secondary | ICD-10-CM | POA: Insufficient documentation

## 2023-07-07 DIAGNOSIS — Z86711 Personal history of pulmonary embolism: Secondary | ICD-10-CM

## 2023-07-07 DIAGNOSIS — Z8261 Family history of arthritis: Secondary | ICD-10-CM | POA: Diagnosis not present

## 2023-07-07 DIAGNOSIS — Z79899 Other long term (current) drug therapy: Secondary | ICD-10-CM | POA: Diagnosis not present

## 2023-07-07 DIAGNOSIS — Z8719 Personal history of other diseases of the digestive system: Secondary | ICD-10-CM | POA: Diagnosis not present

## 2023-07-07 DIAGNOSIS — Z9049 Acquired absence of other specified parts of digestive tract: Secondary | ICD-10-CM | POA: Diagnosis not present

## 2023-07-07 DIAGNOSIS — Z8249 Family history of ischemic heart disease and other diseases of the circulatory system: Secondary | ICD-10-CM | POA: Insufficient documentation

## 2023-07-07 DIAGNOSIS — Z7901 Long term (current) use of anticoagulants: Secondary | ICD-10-CM | POA: Insufficient documentation

## 2023-07-07 LAB — CBC WITH DIFFERENTIAL (CANCER CENTER ONLY)
Abs Immature Granulocytes: 0.02 10*3/uL (ref 0.00–0.07)
Basophils Absolute: 0 10*3/uL (ref 0.0–0.1)
Basophils Relative: 1 %
Eosinophils Absolute: 0.1 10*3/uL (ref 0.0–0.5)
Eosinophils Relative: 2 %
HCT: 44.2 % (ref 39.0–52.0)
Hemoglobin: 14.5 g/dL (ref 13.0–17.0)
Immature Granulocytes: 0 %
Lymphocytes Relative: 30 %
Lymphs Abs: 1.7 10*3/uL (ref 0.7–4.0)
MCH: 27.3 pg (ref 26.0–34.0)
MCHC: 32.8 g/dL (ref 30.0–36.0)
MCV: 83.2 fL (ref 80.0–100.0)
Monocytes Absolute: 0.6 10*3/uL (ref 0.1–1.0)
Monocytes Relative: 10 %
Neutro Abs: 3.4 10*3/uL (ref 1.7–7.7)
Neutrophils Relative %: 57 %
Platelet Count: 170 10*3/uL (ref 150–400)
RBC: 5.31 MIL/uL (ref 4.22–5.81)
RDW: 15.4 % (ref 11.5–15.5)
WBC Count: 5.9 10*3/uL (ref 4.0–10.5)
nRBC: 0 % (ref 0.0–0.2)

## 2023-07-07 LAB — CMP (CANCER CENTER ONLY)
ALT: 19 U/L (ref 0–44)
AST: 30 U/L (ref 15–41)
Albumin: 4.5 g/dL (ref 3.5–5.0)
Alkaline Phosphatase: 89 U/L (ref 38–126)
Anion gap: 6 (ref 5–15)
BUN: 16 mg/dL (ref 8–23)
CO2: 29 mmol/L (ref 22–32)
Calcium: 9.8 mg/dL (ref 8.9–10.3)
Chloride: 102 mmol/L (ref 98–111)
Creatinine: 0.99 mg/dL (ref 0.61–1.24)
GFR, Estimated: >60 mL/min (ref >60)
Glucose, Bld: 143 mg/dL — ABNORMAL HIGH (ref 70–99)
Potassium: 4.3 mmol/L (ref 3.5–5.1)
Sodium: 137 mmol/L (ref 135–145)
Total Bilirubin: 0.6 mg/dL (ref 0.0–1.2)
Total Protein: 7.5 g/dL (ref 6.5–8.1)

## 2023-07-07 NOTE — Progress Notes (Signed)
 [facility] Telephone:([phone]) [phone]   [phone]  PROGRESS NOTE  Patient Care Team: Jeoffrey Massed, MD as PCP - General (Family Medicine) Charna Elizabeth, MD as Consulting Physician (Gastroenterology) Ronney Asters, DC as Referring Physician (Chiropractic Medicine) Rozanna Box, MD as Referring Physician (Dermatology) Alfredo Martinez, MD as Consulting Physician (Urology)  Hematological/Oncological History # Hx of unprovoked pulmonary emboli  1) 09/18/2020-09/21/2020: Admitted for acute pulmonary emboli after presenting to the ED shortness of breath, sternal chest pain and upper abdominal pain. CTA chest confirmed multi segment pulmonary emboli in the right lower lobe with pulmonary infarct/ischemia. No evidence of heart strain. Patient was started on heparin and transitioned to Eliquis.   2) 10/16/2020: Establish care with [name] PA-C. Recommended indefinite anticoagulation due to unprovoked PE.   3) 04/18/2021: Recommend to transition to maintenance dose of Eliquis 2.5 mg twice daily.   HISTORY OF PRESENTING ILLNESS:  Shawn [name] 78 y.o. male returns for follow-up for history of unprovoked pulmonary emboli currently on indefinite anticoagulation with Eliquis. He is unaccompanied for this visit.  At today's visit, Shawn [name] reports he is doing well since the last visit. He is active and tries to walk 3 times a day, up to a mile. He has a good appetite without significant weight changes. He denies nausea, vomiting or bowel habit changes. He does bruise easily but no overt signs of bleeding. He notes he is not having any signs or symptoms concerning for recurrent VTE such as chest pain, leg swelling, shortness of breath or chest pain.  Overall he is willing and able to continue on Eliquis therapy at this time and has no questions, concerns, or complaints.  He denies any fevers, chills, shortness of breath, chest pain, cough, nausea, vomiting, diarrhea or  constipation. Rest of the 10 point ROS is below.  MEDICAL HISTORY:  Past Medical History:  Diagnosis Date   Acute urinary retention 12/2018   ?BPH   Asymmetrical sensorineural hearing loss 2021   Likely from noise exposure.  [facility] [phone] ENT referral for this dx being relatively new.   Barrett's esophagus    Dr. Loreta Ave   Cataract    Chronic renal insufficiency, stage 2 (mild) 2017   Stage II/III (GFR 60 ml/min)   GERD (gastroesophageal reflux disease)    History of adenomatous polyp of colon 09/2007   Polyps at 1st screening colonoscopy; 2014 no polyps.  Rpt 12/29/17: adenomatous polyp-->  Recall 5 yrs.   Hyperlipemia, mixed    Pt intol of welchol; fenofibrate helpful.  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  Unprovoked->hosp ref to hem/onc.  Pulm recommended anticoag indefinitely.   Subclinical hypothyroidism    per old PCP records.  TPO ab's elevated mildly 12/2017, with TSH 6.6 and normal T4 and T3==>developing Hashimoto's.    Thrombocytopenia (HCC)    Hx of, mild/stable with monitoring per old PCP records    SURGICAL HISTORY: Past Surgical History:  Procedure Laterality Date   APPENDECTOMY     CATARACT EXTRACTION Bilateral 04/13/2018   05/14/2018   COLONOSCOPY  12/29/2017   2019 adenoma,  12/2022 polyp x 1   COLONOSCOPY W/ POLYPECTOMY  2014   per Dr. 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   UPPER GI ENDOSCOPY  03/03/2019   2020.  03/03/21->small patch Barretts, multiple sessile gastric polyps, normal duodenum. Rpt 3 yrs.    SOCIAL HISTORY: Social History   Socioeconomic History   Marital status: Married    Spouse name: Not on file   Number of children: Not on file   Years of education: Not on file   Highest education level: Master's degree (e.g., MA, MS, MEng, MEd, MSW, MBA)  Occupational History   Not on file  Tobacco Use   Smoking status: Never   Smokeless tobacco: Never  Vaping Use   Vaping status: Never Used  Substance and Sexual Activity   Alcohol use: No   Drug use: No   Sexual activity: Not on file  Other Topics Concern   Not on file  Social History Narrative   Married, grand-daughter lives with him.   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   Orig from [place] and [place].   No T/A/Ds.   Exercises 3 X/week--walking 2-3 miles.   Social Drivers of Corporate investment banker Strain: Low Risk  (08/19/2022)   Received from [facility], [facility]   Overall Financial Resource Strain (CARDIA)    Difficulty of Paying Living Expenses: Not hard at all  Food Insecurity: No Food Insecurity (09/22/2022)   Hunger Vital Sign    Worried About Running Out of Food in the Last Year: Never true    Ran Out of Food in the Last Year: Never true  Transportation Needs: No Transportation Needs (09/22/2022)   PRAPARE - Administrator, Civil Service (Medical): No    Lack of Transportation (Non-Medical): No  Physical Activity: Insufficiently Active (08/19/2022)   Received from [facility], [facility]   Exercise Vital Sign    Days of Exercise per Week: 7 days    Minutes of Exercise per Session: 20 min  Stress: No Stress Concern Present (08/19/2022)   Received from [facility], [facility] - Occupational Stress  Questionnaire    Feeling of Stress : Not at all  Social Connections: Socially Integrated (08/19/2022)   Received from [facility], [facility]   Social Network    How would you rate your social network (family, work, friends)?: Good participation with social networks  Intimate Partner Violence: Not At Risk (09/22/2022)   Humiliation, Afraid, Rape, and Kick questionnaire    Fear of Current or Ex-Partner: No    Emotionally Abused: No    Physically Abused: No    Sexually Abused: No    FAMILY HISTORY: Family History  Problem Relation Age of Onset   Hypertension Mother    Hyperlipidemia Mother    Arthritis Father    Diabetes Paternal Grandmother 70    ALLERGIES:  has no known allergies.  MEDICATIONS:  Current Outpatient Medications  Medication Sig Dispense Refill   apixaban (ELIQUIS) 2.5 MG TABS tablet Take 1 tablet (2.5 mg total) by mouth 2 (two) times daily. 180 tablet 3   atorvastatin (LIPITOR) 10 MG tablet Take 1 tablet (10 mg  total) by mouth daily. 90 tablet 3   benazepril (LOTENSIN) 20 MG tablet Take 1 tablet (20 mg total) by mouth daily. 90 tablet 3   EPINEPHrine 0.3 mg/0.3 mL IJ SOAJ injection Inject 0.3 mg into the muscle as needed for anaphylaxis. 2 each 1   hydrochlorothiazide (HYDRODIURIL) 25 MG tablet TAKE ONE-HALF (1/2) TABLET DAILY (APPOINTMENT DUE IN OCTOBER) 45 tablet 1   omeprazole (PRILOSEC) 40 MG capsule Take 40 mg by mouth daily.     Polyethyl Glycol-Propyl Glycol (SYSTANE OP) Place 1 drop into both eyes as needed (dry eyes).     No current facility-administered medications for this visit.    REVIEW OF SYSTEMS:   Constitutional: ( - ) fevers, ( - )  chills , ( - ) night sweats Eyes: ( - ) blurriness of vision, ( - ) double vision, ( - ) watery eyes Ears, nose, mouth, throat, and face: ( - ) mucositis, ( - ) sore throat Respiratory: ( - ) cough, ( - ) dyspnea, ( - ) wheezes Cardiovascular: ( - ) palpitation, ( - ) chest discomfort, ( - ) lower extremity  swelling Gastrointestinal:  ( - ) nausea, ( - ) heartburn, ( - ) change in bowel habits Skin: ( + ) abnormal skin rashes Lymphatics: ( - ) new lymphadenopathy, ( - ) easy bruising Neurological: ( - ) numbness, ( - ) tingling, ( - ) new weaknesses Behavioral/Psych: ( - ) mood change, ( - ) new changes  All other systems were reviewed with the patient and are negative.  PHYSICAL EXAMINATION: ECOG PERFORMANCE STATUS: 0 - Asymptomatic  Vitals:   07/07/23 1048  BP: (!) 142/75  Pulse: 62  Resp: 18  Temp: (!) 97.5 F (36.4 C)  SpO2: 99%     Filed Weights   07/07/23 1048  Weight: 208 lb 8 oz (94.6 kg)      GENERAL: well appearing male in NAD  SKIN: skin color, texture, turgor are normal, no rashes or significant lesions EYES: conjunctiva are pink and non-injected, sclera clear OROPHARYNX: no exudate, no erythema; lips, buccal mucosa, and tongue normal  LUNGS: clear to auscultation and percussion with normal breathing effort HEART: regular rate & rhythm and no murmurs and no lower extremity edema Musculoskeletal: no cyanosis of digits and no clubbing  PSYCH: alert & oriented x 3, fluent speech NEURO: no focal motor/sensory deficits  LABORATORY DATA:  I have reviewed the data as listed    Latest Ref Rng & Units 07/07/2023   10:29 AM 01/07/2023    9:51 AM 12/30/2022    9:36 AM  CBC  WBC 4.0 - 10.5 K/uL 5.9  6.3  5.9   Hemoglobin 13.0 - 17.0 g/dL 16.1  09.6  04.5   Hematocrit 39.0 - 52.0 % 44.2  42.8  44.2   Platelets 150 - 400 K/uL 170  171  171.0        Latest Ref Rng & Units 07/07/2023   10:29 AM 01/07/2023    9:51 AM 12/30/2022    9:36 AM  CMP  Glucose 70 - 99 mg/dL 409  87  86   BUN 8 - 23 mg/dL 16  14  15    Creatinine 0.61 - 1.24 mg/dL 8.11  9.14  7.82   Sodium 135 - 145 mmol/L 137  135  136   Potassium 3.5 - 5.1 mmol/L 4.3  4.2  4.0   Chloride 98 - 111 mmol/L 102  103  102   CO2  22 - 32 mmol/L 29  25  28    Calcium 8.9 - 10.3 mg/dL 9.8  9.8  9.6   Total Protein  6.5 - 8.1 g/dL 7.5  7.6  7.0   Total Bilirubin 0.0 - 1.2 mg/dL 0.6  0.5  0.5   Alkaline Phos 38 - 126 U/L 89  94  94   AST 15 - 41 U/L 30  24  23    ALT 0 - 44 U/L 19  19  17      RADIOGRAPHIC STUDIES: I have personally reviewed the radiological images as listed and agreed with the findings in the report. No results found.  ASSESSMENT & PLAN Shawn [name] is a 78 y.o. male returns for routine follow-up for history of pulmonary emboli.  # H/O pulmonary emboli involving the right lower lobe: -Unprovoked VTE so recommendation is indefinite anticoagulation. -Labs from 10/16/2020 showed mild elevation of anticardiolipin IgG and IgM antibodies and normal beta-2 glycoprotein antibodies.Does not meet criteria for antiphospholipid syndrome.  -Currently on Eliquis maintenance 2.5 mg twice daily. Denies easy bruising or signs of active bleeding.  -Labs today show white blood cell count 5.9, hemoglobin 14.5, MCV 83.2, and platelets of 170.  Creatinine and LFTs normal.  Labs adequate for continued Eliquis treatment. -RTC in 12 months with labs to monitor.   No orders of the defined types were placed in this encounter.   All questions were answered. The patient knows to call the clinic with any problems, questions or concerns.  I have spent a total of 25 minutes minutes of face-to-face and non-face-to-face time, preparing to see the patient, performing a medically appropriate examination, counseling and educating the patient, documenting clinical information in the electronic health record, and care coordination.   [name] PA-C Dept of Hematology and Oncology [facility] Phone: [phone]

## 2023-07-11 ENCOUNTER — Encounter: Payer: Self-pay | Admitting: Family Medicine

## 2023-07-29 ENCOUNTER — Encounter: Payer: Self-pay | Admitting: Family Medicine

## 2023-07-29 ENCOUNTER — Ambulatory Visit (INDEPENDENT_AMBULATORY_CARE_PROVIDER_SITE_OTHER): Admitting: Family Medicine

## 2023-07-29 VITALS — BP 108/64 | HR 64 | Temp 98.5°F | Ht 70.0 in | Wt 210.8 lb

## 2023-07-29 DIAGNOSIS — I10 Essential (primary) hypertension: Secondary | ICD-10-CM | POA: Diagnosis not present

## 2023-07-29 NOTE — Progress Notes (Signed)
 OFFICE VISIT  07/29/2023  CC:  Chief Complaint  Patient presents with   Medical Management of Chronic Issues    Pt would like to discuss benazepril dose; alternative     Patient is a 78 y.o. male who presents for 105-month follow-up hypertension.  INTERIM HX: Shawn [name] is feeling well. Last month his blood pressure was mildly elevated during his routine follow-up visit with his hematologist (142/75). He does not monitor blood pressure at home. No dizziness, chest pain, shortness of breath, or palpitations.  He takes 3 walks per day and does 2 stretching/strengthening regimens each day.  Since we recently had to change him from fosinopril to benazepril due to supply issues, he was concerned that possibly the benazepril was not adequately treating his blood pressure.  He is taking a 20 mg tab daily.  Past Medical History:  Diagnosis Date   Acute urinary retention 12/2018   ?BPH   Asymmetrical sensorineural hearing loss 2021   Likely from noise exposure.  [facility] [phone] ENT referral for this dx being relatively new.   Barrett's esophagus    Dr. Loreta Ave   Cataract    Chronic renal insufficiency, stage 2 (mild) 2017   Stage II/III (GFR 60 ml/min)   GERD (gastroesophageal reflux disease)    History of adenomatous polyp of colon 09/2007   Polyps at 1st screening colonoscopy; 2014 no polyps.  Rpt 12/29/17: adenomatous polyp-->  Recall 5 yrs.   Hyperlipemia, mixed    Pt intol of welchol; fenofibrate helpful.  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  Unprovoked->hosp ref to hem/onc.  Pulm recommended anticoag indefinitely.   Subclinical hypothyroidism    per old PCP records.  TPO ab's elevated mildly 12/2017, with TSH 6.6 and normal T4 and  T3==>developing Hashimoto's.    Thrombocytopenia (HCC)    Hx of, mild/stable with monitoring per old PCP records    Past Surgical History:  Procedure Laterality Date   APPENDECTOMY     CATARACT EXTRACTION Bilateral 04/13/2018   05/14/2018   COLONOSCOPY  12/29/2017   2019 adenoma,  12/2022 polyp x 1   COLONOSCOPY W/ POLYPECTOMY  2014   per Dr. Loreta Ave, recall 02/2023   ESOPHAGOGASTRODUODENOSCOPY  2014; 2016; 11/16/16   2018--Barrett's esoph reconfirmed   TOTAL HIP ARTHROPLASTY Left 09/22/2022   Procedure: TOTAL HIP ARTHROPLASTY;  Surgeon: Teryl Lucy, MD;  Location: [facility];  Service: Orthopedics;  Laterality: Left;   TRANSTHORACIC ECHOCARDIOGRAM  09/19/2020   (in the setting of acute PE)->EF 70-75%, RV fxn mildly reduced, hyperdynamic LV fxn, normal PA pressure, no valve probs.   UPPER GI ENDOSCOPY  03/03/2019   2020.  03/03/21->small patch Barretts, multiple sessile gastric polyps, normal duodenum. Rpt 3 yrs.    Outpatient Medications Prior to Visit  Medication Sig Dispense Refill   apixaban (ELIQUIS) 2.5 MG TABS tablet Take 1 tablet (2.5 mg total) by mouth 2 (two) times daily. 180 tablet 3   atorvastatin (LIPITOR) 10 MG tablet Take 1 tablet (10 mg total) by mouth daily. 90 tablet 3   benazepril (LOTENSIN) 20 MG tablet Take 1 tablet (20 mg total) by mouth daily. 90 tablet 3   hydrochlorothiazide (HYDRODIURIL) 25 MG tablet TAKE ONE-HALF (1/2) TABLET DAILY (APPOINTMENT DUE  IN OCTOBER) 45 tablet 1   omeprazole (PRILOSEC) 40 MG capsule Take 40 mg by mouth daily.     Polyethyl Glycol-Propyl Glycol (SYSTANE OP) Place 1 drop into both eyes as needed (dry eyes).     EPINEPHrine 0.3 mg/0.3 mL IJ SOAJ injection Inject 0.3 mg into the muscle as needed for anaphylaxis. (Patient not taking: Reported on 07/29/2023) 2 each 1   No facility-administered medications prior to visit.    No Known Allergies  Review of Systems As per HPI  PE:    07/29/2023    1:23 PM 07/07/2023   10:48 AM 01/07/2023    10:14 AM  Vitals with BMI  Height 5\' 10"  5\' 10"    Weight 210 lbs 13 oz 208 lbs 8 oz   BMI 30.25 29.92   Systolic 108 142 756  Diastolic 64 75 63  Pulse 64 62 67     Physical Exam  Gen: Alert, well appearing.  Patient is oriented to person, place, time, and situation. AFFECT: pleasant, lucid thought and speech. Cardiovascular: Regular rhythm and rate without murmur  LABS:  Last metabolic panel Lab Results  Component Value Date   GLUCOSE 143 (H) 07/07/2023   NA 137 07/07/2023   K 4.3 07/07/2023   CL 102 07/07/2023   CO2 29 07/07/2023   BUN 16 07/07/2023   CREATININE 0.99 07/07/2023   GFRNONAA >60 07/07/2023   CALCIUM 9.8 07/07/2023   PROT 7.5 07/07/2023   ALBUMIN 4.5 07/07/2023   BILITOT 0.6 07/07/2023   ALKPHOS 89 07/07/2023   AST 30 07/07/2023   ALT 19 07/07/2023   ANIONGAP 6 07/07/2023   Lab Results  Component Value Date   CHOL 170 12/30/2022   HDL 47.60 12/30/2022   LDLCALC 98 12/30/2022   TRIG 121.0 12/30/2022   CHOLHDL 4 12/30/2022   Lab Results  Component Value Date   WBC 5.9 07/07/2023   HGB 14.5 07/07/2023   HCT 44.2 07/07/2023   MCV 83.2 07/07/2023   PLT 170 07/07/2023   IMPRESSION AND PLAN:  #1 hypertension, well-controlled. Reassured.  Discussed possible home blood pressure monitoring but he states that he does not really trust the over-the-counter automated blood pressure monitoring devices. Continue benazepril 20 mg a day and HCTZ, 1/2  of the 25 mg tab daily. His renal function and electrolytes were normal when checked by hematology about 3 weeks ago.  An After Visit Summary was printed and given to the patient.  FOLLOW UP: Return in about 6 months (around 01/28/2024) for routine chronic illness f/u.  Signed:  Arletha Lady, MD           07/29/2023

## 2023-08-23 ENCOUNTER — Ambulatory Visit (INDEPENDENT_AMBULATORY_CARE_PROVIDER_SITE_OTHER): Admitting: Family Medicine

## 2023-08-23 ENCOUNTER — Encounter: Payer: Self-pay | Admitting: Family Medicine

## 2023-08-23 VITALS — BP 132/69 | HR 65 | Temp 98.1°F | Ht 70.0 in | Wt 207.8 lb

## 2023-08-23 DIAGNOSIS — K4090 Unilateral inguinal hernia, without obstruction or gangrene, not specified as recurrent: Secondary | ICD-10-CM

## 2023-08-23 NOTE — Progress Notes (Signed)
 OFFICE VISIT  08/23/2023  CC:  Chief Complaint  Patient presents with   Hernia    Pt believes he has sub inguinal hernia    Patient is a 78 y.o. male who presents for concern of hernia.  HPI: Last week he was lifting heavy boxes frequently, most of them 20 to 30 pounds weight. He felt increased in size of his right inguinal hernia.  Hurts some squishy noises. Denies groin pain, nausea, abdominal pain, constipation, or diarrhea.   Past Medical History:  Diagnosis Date   Acute urinary retention 12/2018   ?BPH   Asymmetrical sensorineural hearing loss 2021   Likely from noise exposure.  [facility] [phone] ENT referral for this dx being relatively new.   Barrett's esophagus    Dr. Tova Fresh   Cataract    Chronic renal insufficiency, stage 2 (mild) 2017   Stage II/III (GFR 60 ml/min)   GERD (gastroesophageal reflux disease)    Hepatic steatosis    on CT abd 2022   History of adenomatous polyp of colon 09/2007   Polyps at 1st screening colonoscopy; 2014 no polyps.  Rpt 12/29/17: adenomatous polyp-->  Recall 5 yrs.   Hyperlipemia, mixed    Pt intol of welchol; fenofibrate  helpful.  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  Unprovoked->hosp ref to hem/onc.  Pulm recommended anticoag indefinitely.   Subclinical hypothyroidism    per old PCP records.  TPO ab's elevated mildly 12/2017, with TSH 6.6 and normal T4 and T3==>developing Hashimoto's.    Thrombocytopenia (HCC)    Hx of, mild/stable with monitoring per old PCP records    Past Surgical History:  Procedure Laterality Date   APPENDECTOMY     CATARACT EXTRACTION Bilateral 04/13/2018   05/14/2018   COLONOSCOPY  12/29/2017   2019 adenoma,  12/2022 polyp x 1   COLONOSCOPY W/ POLYPECTOMY   2014   per Dr. Tova Fresh, recall 02/2023   ESOPHAGOGASTRODUODENOSCOPY  2014; 2016; 11/16/16   2018--Barrett's esoph reconfirmed   TOTAL HIP ARTHROPLASTY Left 09/22/2022   Procedure: TOTAL HIP ARTHROPLASTY;  Surgeon: Osa Blase, MD;  Location: [facility];  Service: Orthopedics;  Laterality: Left;   TRANSTHORACIC ECHOCARDIOGRAM  09/19/2020   (in the setting of acute PE)->EF 70-75%, RV fxn mildly reduced, hyperdynamic LV fxn, normal PA pressure, no valve probs.   UPPER GI ENDOSCOPY  03/03/2019   2020.  03/03/21->small patch Barretts, multiple sessile gastric polyps, normal duodenum. Rpt 3 yrs.    Outpatient Medications Prior to Visit  Medication Sig Dispense Refill   apixaban  (ELIQUIS ) 2.5 MG TABS tablet Take 1 tablet (2.5 mg total) by mouth 2 (two) times daily. 180 tablet 3   atorvastatin  (LIPITOR) 10 MG tablet Take 1 tablet (10 mg total) by mouth daily. 90 tablet 3   benazepril  (LOTENSIN ) 20 MG tablet Take 1 tablet (20 mg total) by mouth daily. 90 tablet 3   hydrochlorothiazide  (HYDRODIURIL ) 25 MG tablet TAKE ONE-HALF (1/2) TABLET DAILY (APPOINTMENT DUE IN OCTOBER) 45 tablet 1   omeprazole (PRILOSEC) 40 MG capsule Take 40 mg by mouth daily.     Polyethyl Glycol-Propyl Glycol (SYSTANE OP) Place 1 drop into both eyes as needed (dry eyes).     EPINEPHrine  0.3 mg/0.3 mL IJ  SOAJ injection Inject 0.3 mg into the muscle as needed for anaphylaxis. (Patient not taking: Reported on 07/29/2023) 2 each 1   No facility-administered medications prior to visit.    No Known Allergies  Review of Systems  As per HPI  PE:    08/23/2023    4:05 PM 07/29/2023    1:23 PM 07/07/2023   10:48 AM  Vitals with BMI  Height 5\' 10"  5\' 10"  5\' 10"   Weight 207 lbs 13 oz 210 lbs 13 oz 208 lbs 8 oz  BMI 29.82 30.25 29.92  Systolic 132 108 161  Diastolic 69 64 75  Pulse 65 64 62     Physical Exam  Gen: Alert, well appearing.  Patient is oriented to person, place, time, and situation. AFFECT: pleasant, lucid thought  and speech. Abdomen is soft and nontender and nondistended.  Bowel sounds are normal. He has right inguinal soft tissue bulging that is easily reducible when standing and reduces spontaneously when supine.  No tenderness to palpation in the groin.  LABS:    IMPRESSION AND PLAN:  Right inguinal hernia, recently aggravated with heavy lifting. Reassured. Signs/symptoms to call or return for were reviewed and pt expressed understanding.  An After Visit Summary was printed and given to the patient.  FOLLOW UP: Return for as needed.  Signed:  Arletha Lady, MD           08/23/2023

## 2023-08-27 ENCOUNTER — Other Ambulatory Visit: Payer: Self-pay | Admitting: Family Medicine

## 2023-08-27 DIAGNOSIS — D1801 Hemangioma of skin and subcutaneous tissue: Secondary | ICD-10-CM | POA: Diagnosis not present

## 2023-08-27 DIAGNOSIS — D234 Other benign neoplasm of skin of scalp and neck: Secondary | ICD-10-CM | POA: Diagnosis not present

## 2023-08-27 DIAGNOSIS — L821 Other seborrheic keratosis: Secondary | ICD-10-CM | POA: Diagnosis not present

## 2023-08-27 DIAGNOSIS — D229 Melanocytic nevi, unspecified: Secondary | ICD-10-CM | POA: Diagnosis not present

## 2023-08-27 DIAGNOSIS — D224 Melanocytic nevi of scalp and neck: Secondary | ICD-10-CM | POA: Diagnosis not present

## 2023-08-27 DIAGNOSIS — Z872 Personal history of diseases of the skin and subcutaneous tissue: Secondary | ICD-10-CM | POA: Diagnosis not present

## 2023-08-27 DIAGNOSIS — L570 Actinic keratosis: Secondary | ICD-10-CM | POA: Diagnosis not present

## 2023-08-27 DIAGNOSIS — D492 Neoplasm of unspecified behavior of bone, soft tissue, and skin: Secondary | ICD-10-CM | POA: Diagnosis not present

## 2023-08-27 DIAGNOSIS — L814 Other melanin hyperpigmentation: Secondary | ICD-10-CM | POA: Diagnosis not present

## 2023-08-27 DIAGNOSIS — L578 Other skin changes due to chronic exposure to nonionizing radiation: Secondary | ICD-10-CM | POA: Diagnosis not present

## 2023-09-12 ENCOUNTER — Encounter: Payer: Self-pay | Admitting: Family Medicine

## 2023-09-12 DIAGNOSIS — K4090 Unilateral inguinal hernia, without obstruction or gangrene, not specified as recurrent: Secondary | ICD-10-CM

## 2023-09-13 NOTE — Telephone Encounter (Signed)
 Definitely. General surgery referral has been ordered.

## 2023-09-24 ENCOUNTER — Ambulatory Visit: Payer: Self-pay | Admitting: General Surgery

## 2023-09-24 DIAGNOSIS — K4090 Unilateral inguinal hernia, without obstruction or gangrene, not specified as recurrent: Secondary | ICD-10-CM | POA: Diagnosis not present

## 2023-09-24 NOTE — H&P (Signed)
 Chief Complaint: New Consultation (hernia)       History of Present Illness: Shawn [name] is a 78 y.o. male who is seen today as an office consultation at the request of Dr. McGowen for evaluation of New Consultation (hernia) .   History of Present Illness Shawn [name] is a 78 year old male with an inguinal hernia who presents with pressure and discomfort in the groin area. He was referred by Dr. Ledon Pry for evaluation of his inguinal hernia.   An inguinal hernia was initially identified during an MRI for hip replacement. The hernia became more apparent following a 30-pound weight loss after a pulmonary embolism three years ago. Initially asymptomatic, he now experiences pressure in the groin, resembling fluid collection.   He manages anticoagulation with Eliquis , stopping it three days prior to procedures. No generalized abdominal pain, nausea, or vomiting.       Review of Systems: A complete review of systems was obtained from the patient.  I have reviewed this information and discussed as appropriate with the patient.  See HPI as well for other ROS.   Review of Systems  Constitutional:  Negative for fever.  HENT:  Negative for congestion.   Eyes:  Negative for blurred vision.  Respiratory:  Negative for cough, shortness of breath and wheezing.   Cardiovascular:  Negative for chest pain and palpitations.  Gastrointestinal:  Negative for heartburn.  Genitourinary:  Negative for dysuria.  Musculoskeletal:  Negative for myalgias.  Skin:  Negative for rash.  Neurological:  Negative for dizziness and headaches.  Psychiatric/Behavioral:  Negative for depression and suicidal ideas.   All other systems reviewed and are negative.       Medical History: Past Medical History Past Medical History: Diagnosis Date  GERD (gastroesophageal reflux disease)    Hyperlipidemia    Hypertension    Pulmonary embolism (CMS/HHS-HCC)    Thyroid  disease         Problem List There is no  problem list on file for this patient.     Past Surgical History Past Surgical History: Procedure Laterality Date  APPENDECTOMY      CATARACT EXTRACTION      JOINT REPLACEMENT          Allergies No Known Allergies    Medications Ordered Prior to Encounter Current Outpatient Medications on File Prior to Visit Medication Sig Dispense Refill  apixaban  (ELIQUIS ) 2.5 mg tablet Take 2.5 mg by mouth 2 (two) times daily      atorvastatin  (LIPITOR) 10 MG tablet Take 10 mg by mouth once daily      benazepriL  (LOTENSIN ) 20 MG tablet Take 20 mg by mouth once daily      hydroCHLOROthiazide  (HYDRODIURIL ) 25 MG tablet TAKE ONE-HALF (1/2) TABLET DAILY (APPOINTMENT DUE IN OCTOBER)      omeprazole (PRILOSEC) 40 MG DR capsule          No current facility-administered medications on file prior to visit.      Family History Family History Problem Relation Age of Onset  Coronary Artery Disease (Blocked arteries around heart) Mother    High blood pressure (Hypertension) Mother    Hyperlipidemia (Elevated cholesterol) Mother    Coronary Artery Disease (Blocked arteries around heart) Father        Tobacco Use History Social History    Tobacco Use Smoking Status Never Smokeless Tobacco Never      Social History Social History    Socioeconomic History  Marital status: Married Tobacco Use  Smoking status:  Never  Smokeless tobacco: Never Vaping Use  Vaping status: Never Used Substance and Sexual Activity  Drug use: Never    Social Drivers of Health    Financial Resource Strain: Patient Declined (08/27/2023)   Received from [facility]   Overall Financial Resource Strain (CARDIA)    Difficulty of Paying Living Expenses: Patient declined Food Insecurity: Patient Declined (08/27/2023)   Received from [facility]   Hunger Vital Sign    Within the past 12 months, you worried that your food would run out before you got the money to buy more.: Patient declined    Within the  past 12 months, the food you bought just didn't last and you didn't have money to get more.: Patient declined Transportation Needs: Patient Declined (08/27/2023)   Received from [facility] - Transportation    Lack of Transportation (Medical): Patient declined    Lack of Transportation (Non-Medical): Patient declined Physical Activity: Sufficiently Active (08/22/2023)   Received from [facility]   Exercise Vital Sign    On average, how many days per week do you engage in moderate to strenuous exercise (like a brisk walk)?: 7 days    On average, how many minutes do you engage in exercise at this level?: 60 min Stress: No Stress Concern Present (08/22/2023)   Received from [facility] - Occupational Stress Questionnaire    Feeling of Stress : Not at all Social Connections: Socially Integrated (08/22/2023)   Received from [facility]   Social Connection and Isolation Panel    In a typical week, how many times do you talk on the phone with family, friends, or neighbors?: More than three times a week    How often do you get together with friends or relatives?: More than three times a week    How often do you attend church or religious services?: More than 4 times per year    Do you belong to any clubs or organizations such as church groups, unions, fraternal or athletic groups, or school groups?: Yes    How often do you attend meetings of the clubs or organizations you belong to?: More than 4 times per year    Are you married, widowed, divorced, separated, never married, or living with a partner?: Married Housing Stability: Patient Declined (08/27/2023)   Received from [employer] Stability Vital Sign    In the last 12 months, was there a time when you were not able to pay the mortgage or rent on time?: Patient declined    In the past 12 months, how many times have you moved where you were living?: 1    At any time in the past 12  months, were you homeless or living in a shelter (including now)?: Patient declined      Objective:     Vitals:   09/24/23 0956 BP: 138/84 Pulse: 69 Temp: 36.7 C (98 F) SpO2: 98% Weight: 94.8 kg (209 lb) Height: 179.1 cm (5' 10.5) PainSc: 0-No pain   Body mass index is 29.56 kg/m. Physical Exam Constitutional:      Appearance: Normal appearance.  HENT:     Head: Normocephalic and atraumatic.     Nose: Nose normal. No congestion.     Mouth/Throat:     Mouth: Mucous membranes are moist.     Pharynx: Oropharynx is clear.    Eyes:     Pupils: Pupils are equal, round, and reactive to  light.      Cardiovascular:     Rate and Rhythm: Normal rate and regular rhythm.     Pulses: Normal pulses.     Heart sounds: Normal heart sounds. No murmur heard.    No friction rub. No gallop.  Pulmonary:     Effort: Pulmonary effort is normal. No respiratory distress.     Breath sounds: Normal breath sounds. No stridor. No wheezing, rhonchi or rales.  Abdominal:     General: Abdomen is flat.     Hernia: A hernia is present. Hernia is present in the left inguinal area and right inguinal area.    Musculoskeletal:        General: Normal range of motion.     Cervical back: Normal range of motion.    Skin:    General: Skin is warm and dry.    Neurological:     General: No focal deficit present.     Mental Status: He is alert and oriented to person, place, and time.    Psychiatric:        Mood and Affect: Mood normal.        Thought Content: Thought content normal.          Assessment and Plan: Diagnoses and all orders for this visit:   Unilateral inguinal hernia without obstruction or gangrene, recurrence not specified     BOWYN MERCIER is a 78 y.o. male    1.  We will proceed to the OR for a lap right inguinal hernia repair with mesh. 2. All risks and benefits were discussed with the patient, to generally include infection, bleeding, damage to surrounding structures,  acute and chronic nerve pain, and recurrence. Alternatives were offered and described.  All questions were answered and the patient voiced understanding of the procedure and wishes to proceed at this point.             No follow-ups on file.   Shela Derby, MD, [facility], [place] [facility]

## 2023-09-29 ENCOUNTER — Encounter: Payer: Self-pay | Admitting: *Deleted

## 2023-09-30 ENCOUNTER — Encounter: Payer: Self-pay | Admitting: Family Medicine

## 2023-09-30 DIAGNOSIS — K219 Gastro-esophageal reflux disease without esophagitis: Secondary | ICD-10-CM | POA: Diagnosis not present

## 2023-09-30 DIAGNOSIS — E782 Mixed hyperlipidemia: Secondary | ICD-10-CM | POA: Diagnosis not present

## 2023-09-30 DIAGNOSIS — I10 Essential (primary) hypertension: Secondary | ICD-10-CM | POA: Diagnosis not present

## 2023-09-30 DIAGNOSIS — Z860100 Personal history of colon polyps, unspecified: Secondary | ICD-10-CM | POA: Diagnosis not present

## 2023-09-30 DIAGNOSIS — Z1211 Encounter for screening for malignant neoplasm of colon: Secondary | ICD-10-CM | POA: Diagnosis not present

## 2023-10-29 NOTE — Pre-Procedure Instructions (Signed)
 Surgical Instructions   Your procedure is scheduled on November 08, 2023. Report to [name] Main Entrance A at 12:45 P.M., then check in with the Admitting office. Any questions or running late day of surgery: call [phone]  Questions prior to your surgery date: call [phone], Monday-Friday, 8am-4pm. If you experience any cold or flu symptoms such as cough, fever, chills, shortness of breath, etc. between now and your scheduled surgery, please notify us  at the above number.     Remember:  Do not eat after midnight the night before your surgery   You may drink clear liquids until 11:45 AM the morning of your surgery.   Clear liquids allowed are: Water , Non-Citrus Juices (without pulp), Carbonated Beverages, Clear Tea (no milk, honey, etc.), Black Coffee Only (NO MILK, CREAM OR POWDERED CREAMER of any kind), and Gatorade.  Patient Instructions  The night before surgery:  No food after midnight. ONLY clear liquids after midnight  The day of surgery (if you do NOT have diabetes):  Drink ONE (1) Pre-Surgery Clear Ensure by 11:45 AM the morning of surgery. Drink in one sitting. Do not sip.  This drink was given to you during your hospital  pre-op appointment visit.  Nothing else to drink after completing the  Pre-Surgery Clear Ensure.         If you have questions, please contact your surgeon's office.    Take these medicines the morning of surgery with A SIP OF WATER : atorvastatin  (LIPITOR)  omeprazole (PRILOSEC)    May take these medicines IF NEEDED: acetaminophen  (TYLENOL )  Polyethyl Glycol-Propyl Glycol (SYSTANE OP) eye drops   STOP taking your apixaban  (ELIQUIS ) two days prior to surgery. Your last dose will be July 25th.   One week prior to surgery, STOP taking any Aspirin  (unless otherwise instructed by your surgeon) Aleve, Naproxen, Ibuprofen, Motrin, Advil, Goody's, BC's, all herbal medications, fish oil, and non-prescription vitamins.                      Do NOT Smoke (Tobacco/Vaping) for 24 hours prior to your procedure.  If you use a CPAP at night, you may bring your mask/headgear for your overnight stay.   You will be asked to remove any contacts, glasses, piercing's, hearing aid's, dentures/partials prior to surgery. Please bring cases for these items if needed.    Patients discharged the day of surgery will not be allowed to drive home, and someone needs to stay with them for 24 hours.  SURGICAL WAITING ROOM VISITATION Patients may have no more than 2 support people in the waiting area - these visitors may rotate.   Pre-op nurse will coordinate an appropriate time for 1 ADULT support person, who may not rotate, to accompany patient in pre-op.  Children under the age of 46 must have an adult with them who is not the patient and must remain in the main waiting area with an adult.  If the patient needs to stay at the hospital during part of their recovery, the visitor guidelines for inpatient rooms apply.  Please refer to the [facility] website for the visitor guidelines for any additional information.   If you received a COVID test during your pre-op visit  it is requested that you wear a mask when out in public, stay away from anyone that may not be feeling well and notify your surgeon if you develop symptoms. If you have been in contact with anyone that has tested positive in the last 10  days please notify you surgeon.      Pre-operative CHG Bathing Instructions   You can play a key role in reducing the risk of infection after surgery. Your skin needs to be as free of germs as possible. You can reduce the number of germs on your skin by washing with CHG (chlorhexidine  gluconate) soap before surgery. CHG is an antiseptic soap that kills germs and continues to kill germs even after washing.   DO NOT use if you have an allergy to chlorhexidine /CHG or antibacterial soaps. If your skin becomes reddened or irritated, stop using the CHG  and notify one of our RNs at [phone].              TAKE A SHOWER THE NIGHT BEFORE SURGERY AND THE DAY OF SURGERY    Please keep in mind the following:  DO NOT shave, including legs and underarms, 48 hours prior to surgery.   You may shave your face before/day of surgery.  Place clean sheets on your bed the night before surgery Use a clean washcloth (not used since being washed) for each shower. DO NOT sleep with pet's night before surgery.  CHG Shower Instructions:  Wash your face and private area with normal soap. If you choose to wash your hair, wash first with your normal shampoo.  After you use shampoo/soap, rinse your hair and body thoroughly to remove shampoo/soap residue.  Turn the water  OFF and apply half the bottle of CHG soap to a CLEAN washcloth.  Apply CHG soap ONLY FROM YOUR NECK DOWN TO YOUR TOES (washing for 3-5 minutes)  DO NOT use CHG soap on face, private areas, open wounds, or sores.  Pay special attention to the area where your surgery is being performed.  If you are having back surgery, having someone wash your back for you may be helpful. Wait 2 minutes after CHG soap is applied, then you may rinse off the CHG soap.  Pat dry with a clean towel  Put on clean pajamas    Additional instructions for the day of surgery: DO NOT APPLY any lotions, deodorants, cologne, or perfumes.   Do not wear jewelry or makeup Do not wear nail polish, gel polish, artificial nails, or any other type of covering on natural nails (fingers and toes) Do not bring valuables to the hospital. [facility] is not responsible for valuables/personal belongings. Put on clean/comfortable clothes.  Please brush your teeth.  Ask your nurse before applying any prescription medications to the skin.

## 2023-11-01 ENCOUNTER — Other Ambulatory Visit: Payer: Self-pay

## 2023-11-01 ENCOUNTER — Encounter (HOSPITAL_COMMUNITY): Payer: Self-pay

## 2023-11-01 ENCOUNTER — Encounter (HOSPITAL_COMMUNITY)
Admission: RE | Admit: 2023-11-01 | Discharge: 2023-11-01 | Disposition: A | Discharge status: Home / Self Care | Source: Ambulatory Visit | Attending: General Surgery | Admitting: General Surgery

## 2023-11-01 VITALS — BP 142/82 | HR 57 | Temp 98.0°F | Resp 18 | Ht 70.0 in | Wt 208.9 lb

## 2023-11-01 DIAGNOSIS — Z01818 Encounter for other preprocedural examination: Secondary | ICD-10-CM | POA: Diagnosis not present

## 2023-11-01 DIAGNOSIS — I2510 Atherosclerotic heart disease of native coronary artery without angina pectoris: Secondary | ICD-10-CM | POA: Insufficient documentation

## 2023-11-01 DIAGNOSIS — R001 Bradycardia, unspecified: Secondary | ICD-10-CM | POA: Diagnosis not present

## 2023-11-01 LAB — CBC
HCT: 43.3 % (ref 39.0–52.0)
Hemoglobin: 14.3 g/dL (ref 13.0–17.0)
MCH: 27.7 pg (ref 26.0–34.0)
MCHC: 33 g/dL (ref 30.0–36.0)
MCV: 83.8 fL (ref 80.0–100.0)
Platelets: 175 K/uL (ref 150–400)
RBC: 5.17 MIL/uL (ref 4.22–5.81)
RDW: 15.3 % (ref 11.5–15.5)
WBC: 6.9 K/uL (ref 4.0–10.5)
nRBC: 0 % (ref 0.0–0.2)

## 2023-11-01 LAB — BASIC METABOLIC PANEL WITH GFR
Anion gap: 9 (ref 5–15)
BUN: 11 mg/dL (ref 8–23)
CO2: 26 mmol/L (ref 22–32)
Calcium: 9.8 mg/dL (ref 8.9–10.3)
Chloride: 100 mmol/L (ref 98–111)
Creatinine, Ser: 0.89 mg/dL (ref 0.61–1.24)
GFR, Estimated: >60 mL/min (ref >60)
Glucose, Bld: 97 mg/dL (ref 70–99)
Potassium: 4.2 mmol/L (ref 3.5–5.1)
Sodium: 135 mmol/L (ref 135–145)

## 2023-11-01 NOTE — Progress Notes (Signed)
 PCP - Dr. Aleene Hockey Cardiologist - Denies Hematologist/Oncologist - Dr. Norleen Kidney - last office visit 07/07/2023 with PA  PPM/ICD - Denies Device Orders - n/a Rep Notified - n/a  Chest x-ray - n/a EKG - 11/01/2023 Stress Test - Denies ECHO - 09/19/2020 Cardiac Cath - Denies  Sleep Study - Denies CPAP - n/a  No DM  Last dose of GLP1 agonist- n/a GLP1 instructions: n/a  Blood Thinner Instructions: Pt instructed to stop taking Eliquis  2 days prior to surgery. Last dose will be July 25th. Aspirin  Instructions: n/a  ERAS Protcol - Clear liquids until 1145 morning of surgery PRE-SURGERY Ensure or G2- Ensure given to pt with instructions  COVID TEST- n/a   Anesthesia review: Yes. Borderline EKG review. 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   Patient denies shortness of breath, fever, cough and chest pain at PAT appointment. Pt denies any respiratory illness/infection in the last two months.    All instructions explained to the patient, with a verbal understanding of the material. Patient agrees to go over the instructions while at home for a better understanding. Patient also instructed to self quarantine after being tested for COVID-19. The opportunity to ask questions was provided.

## 2023-11-07 NOTE — Progress Notes (Signed)
 Unable to reach pt or pt's wife. LM for both with new arrival time 1100, ERAS 1000.

## 2023-11-08 ENCOUNTER — Encounter (HOSPITAL_COMMUNITY): Payer: Self-pay | Admitting: General Surgery

## 2023-11-08 ENCOUNTER — Ambulatory Visit (HOSPITAL_COMMUNITY)
Admission: RE | Admit: 2023-11-08 | Discharge: 2023-11-08 | Disposition: A | Discharge status: Home / Self Care | Attending: General Surgery | Admitting: General Surgery

## 2023-11-08 ENCOUNTER — Ambulatory Visit (HOSPITAL_COMMUNITY): Payer: Self-pay | Admitting: Anesthesiology

## 2023-11-08 ENCOUNTER — Other Ambulatory Visit: Payer: Self-pay

## 2023-11-08 ENCOUNTER — Ambulatory Visit (HOSPITAL_COMMUNITY): Payer: Self-pay | Admitting: Physician Assistant

## 2023-11-08 ENCOUNTER — Encounter (HOSPITAL_COMMUNITY)
Admission: RE | Disposition: A | Discharge status: Home / Self Care | Payer: Self-pay | Source: Home / Self Care | Attending: General Surgery

## 2023-11-08 DIAGNOSIS — K4090 Unilateral inguinal hernia, without obstruction or gangrene, not specified as recurrent: Secondary | ICD-10-CM | POA: Insufficient documentation

## 2023-11-08 DIAGNOSIS — N189 Chronic kidney disease, unspecified: Secondary | ICD-10-CM | POA: Diagnosis not present

## 2023-11-08 DIAGNOSIS — Z7901 Long term (current) use of anticoagulants: Secondary | ICD-10-CM | POA: Diagnosis not present

## 2023-11-08 DIAGNOSIS — E785 Hyperlipidemia, unspecified: Secondary | ICD-10-CM | POA: Insufficient documentation

## 2023-11-08 DIAGNOSIS — Z86711 Personal history of pulmonary embolism: Secondary | ICD-10-CM | POA: Diagnosis not present

## 2023-11-08 DIAGNOSIS — K219 Gastro-esophageal reflux disease without esophagitis: Secondary | ICD-10-CM | POA: Diagnosis not present

## 2023-11-08 DIAGNOSIS — Z8249 Family history of ischemic heart disease and other diseases of the circulatory system: Secondary | ICD-10-CM | POA: Insufficient documentation

## 2023-11-08 DIAGNOSIS — I10 Essential (primary) hypertension: Secondary | ICD-10-CM

## 2023-11-08 DIAGNOSIS — I129 Hypertensive chronic kidney disease with stage 1 through stage 4 chronic kidney disease, or unspecified chronic kidney disease: Secondary | ICD-10-CM | POA: Diagnosis not present

## 2023-11-08 DIAGNOSIS — Z79899 Other long term (current) drug therapy: Secondary | ICD-10-CM | POA: Diagnosis not present

## 2023-11-08 DIAGNOSIS — G709 Myoneural disorder, unspecified: Secondary | ICD-10-CM | POA: Diagnosis not present

## 2023-11-08 DIAGNOSIS — E039 Hypothyroidism, unspecified: Secondary | ICD-10-CM | POA: Diagnosis not present

## 2023-11-08 DIAGNOSIS — E782 Mixed hyperlipidemia: Secondary | ICD-10-CM

## 2023-11-08 DIAGNOSIS — M1990 Unspecified osteoarthritis, unspecified site: Secondary | ICD-10-CM | POA: Insufficient documentation

## 2023-11-08 HISTORY — PX: INGUINAL HERNIA REPAIR: SHX194

## 2023-11-08 SURGERY — REPAIR, HERNIA, INGUINAL, LAPAROSCOPIC
Anesthesia: General | Site: Groin | Laterality: Right

## 2023-11-08 MED ORDER — LIDOCAINE 2% (20 MG/ML) 5 ML SYRINGE
INTRAMUSCULAR | Status: DC | PRN
  Administered 2023-11-08: 60 mg via INTRAVENOUS

## 2023-11-08 MED ORDER — LACTATED RINGERS IV SOLN: INTRAVENOUS | Status: DC

## 2023-11-08 MED ORDER — ONDANSETRON HCL 4 MG/2ML IJ SOLN: 4.0000 mg | Freq: Once | INTRAMUSCULAR | Status: DC | PRN

## 2023-11-08 MED ORDER — ORAL CARE MOUTH RINSE: 15.0000 mL | Freq: Once | OROMUCOSAL | Status: AC

## 2023-11-08 MED ORDER — FENTANYL CITRATE (PF) 250 MCG/5ML IJ SOLN
INTRAMUSCULAR | Status: AC
Start: 2023-11-08 — End: 2023-11-08
  Filled 2023-11-08: qty 5

## 2023-11-08 MED ORDER — DEXAMETHASONE SODIUM PHOSPHATE 10 MG/ML IJ SOLN
INTRAMUSCULAR | Status: DC | PRN
  Administered 2023-11-08: 5 mg via INTRAVENOUS

## 2023-11-08 MED ORDER — BUPIVACAINE HCL 0.25 % IJ SOLN
INTRAMUSCULAR | Status: DC | PRN
  Administered 2023-11-08: 8 mL

## 2023-11-08 MED ORDER — SUGAMMADEX SODIUM 200 MG/2ML IV SOLN
INTRAVENOUS | Status: DC | PRN
  Administered 2023-11-08: 400 mg via INTRAVENOUS

## 2023-11-08 MED ORDER — CEFAZOLIN SODIUM-DEXTROSE 2-4 GM/100ML-% IV SOLN
2.0000 g | INTRAVENOUS | Status: AC
  Administered 2023-11-08: 2 g via INTRAVENOUS
  Filled 2023-11-08: qty 100

## 2023-11-08 MED ORDER — OXYCODONE HCL 5 MG PO TABS
5.0000 mg | ORAL_TABLET | Freq: Once | ORAL | Status: AC | PRN
  Administered 2023-11-08: 5 mg via ORAL

## 2023-11-08 MED ORDER — MIDAZOLAM HCL 2 MG/2ML IJ SOLN
INTRAMUSCULAR | Status: AC
  Filled 2023-11-08: qty 2

## 2023-11-08 MED ORDER — ROCURONIUM BROMIDE 10 MG/ML (PF) SYRINGE
PREFILLED_SYRINGE | INTRAVENOUS | Status: DC | PRN
  Administered 2023-11-08: 50 mg via INTRAVENOUS

## 2023-11-08 MED ORDER — CHLORHEXIDINE GLUCONATE CLOTH 2 % EX PADS: 6.0000 | MEDICATED_PAD | Freq: Once | CUTANEOUS | Status: DC

## 2023-11-08 MED ORDER — ACETAMINOPHEN 500 MG PO TABS
1000.0000 mg | ORAL_TABLET | ORAL | Status: AC
  Administered 2023-11-08: 1000 mg via ORAL
  Filled 2023-11-08: qty 2

## 2023-11-08 MED ORDER — FENTANYL CITRATE (PF) 250 MCG/5ML IJ SOLN
INTRAMUSCULAR | Status: DC | PRN
  Administered 2023-11-08: 25 ug via INTRAVENOUS
  Administered 2023-11-08: 75 ug via INTRAVENOUS

## 2023-11-08 MED ORDER — ONDANSETRON HCL 4 MG/2ML IJ SOLN
INTRAMUSCULAR | Status: DC | PRN
  Administered 2023-11-08: 4 mg via INTRAVENOUS

## 2023-11-08 MED ORDER — PROPOFOL 10 MG/ML IV BOLUS
INTRAVENOUS | Status: AC
  Filled 2023-11-08: qty 20

## 2023-11-08 MED ORDER — ENSURE PRE-SURGERY PO LIQD: 296.0000 mL | Freq: Once | ORAL | Status: DC

## 2023-11-08 MED ORDER — OXYCODONE HCL 5 MG PO TABS
ORAL_TABLET | ORAL | Status: AC
  Filled 2023-11-08: qty 1

## 2023-11-08 MED ORDER — OXYCODONE HCL 5 MG/5ML PO SOLN: 5.0000 mg | Freq: Once | ORAL | Status: AC | PRN

## 2023-11-08 MED ORDER — CHLORHEXIDINE GLUCONATE 0.12 % MT SOLN
15.0000 mL | Freq: Once | OROMUCOSAL | Status: AC
  Administered 2023-11-08: 15 mL via OROMUCOSAL
  Filled 2023-11-08: qty 15

## 2023-11-08 MED ORDER — FENTANYL CITRATE (PF) 100 MCG/2ML IJ SOLN: 25.0000 ug | INTRAMUSCULAR | Status: DC | PRN

## 2023-11-08 MED ORDER — TRAMADOL HCL 50 MG PO TABS: 50.0000 mg | ORAL_TABLET | Freq: Four times a day (QID) | ORAL | 0 refills | Status: DC | PRN

## 2023-11-08 MED ORDER — MIDAZOLAM HCL 2 MG/2ML IJ SOLN
INTRAMUSCULAR | Status: DC | PRN
  Administered 2023-11-08 (×2): 1 mg via INTRAVENOUS

## 2023-11-08 MED ORDER — PROPOFOL 10 MG/ML IV BOLUS
INTRAVENOUS | Status: DC | PRN
  Administered 2023-11-08: 200 mg via INTRAVENOUS

## 2023-11-08 MED ORDER — 0.9 % SODIUM CHLORIDE (POUR BTL) OPTIME
TOPICAL | Status: DC | PRN
  Administered 2023-11-08: 1000 mL

## 2023-11-08 MED ORDER — MEPERIDINE HCL 25 MG/ML IJ SOLN: 6.2500 mg | INTRAMUSCULAR | Status: DC | PRN

## 2023-11-08 SURGICAL SUPPLY — 33 items
BAG COUNTER SPONGE SURGICOUNT (BAG) ×1 IMPLANT
CANISTER SUCTION 3000ML PPV (SUCTIONS) IMPLANT
COVER SURGICAL LIGHT HANDLE (MISCELLANEOUS) ×1 IMPLANT
DERMABOND ADVANCED .7 DNX12 (GAUZE/BANDAGES/DRESSINGS) ×1 IMPLANT
DISSECTOR BLUNT TIP ENDO 5MM (MISCELLANEOUS) IMPLANT
ELECTRODE REM PT RTRN 9FT ADLT (ELECTROSURGICAL) ×1 IMPLANT
ENDOLOOP SUT PDS II 0 18 (SUTURE) IMPLANT
GLOVE BIO SURGEON STRL SZ7.5 (GLOVE) ×2 IMPLANT
GOWN STRL REUS W/ TWL LRG LVL3 (GOWN DISPOSABLE) ×2 IMPLANT
GOWN STRL REUS W/ TWL XL LVL3 (GOWN DISPOSABLE) ×1 IMPLANT
IRRIGATION SUCT STRKRFLW 2 WTP (MISCELLANEOUS) IMPLANT
KIT BASIN OR (CUSTOM PROCEDURE TRAY) ×1 IMPLANT
KIT TURNOVER KIT B (KITS) ×1 IMPLANT
MESH 3DMAX 5X7 RT XLRG (Mesh General) IMPLANT
NDL INSUFFLATION 14GA 120MM (NEEDLE) IMPLANT
NEEDLE INSUFFLATION 14GA 120MM (NEEDLE) ×1 IMPLANT
NS IRRIG 1000ML POUR BTL (IV SOLUTION) ×1 IMPLANT
PAD ARMBOARD POSITIONER FOAM (MISCELLANEOUS) ×2 IMPLANT
RELOAD STAPLE 4.0 BLU F/HERNIA (INSTRUMENTS) IMPLANT
SCISSORS LAP 5X35 DISP (ENDOMECHANICALS) ×1 IMPLANT
SET TUBE SMOKE EVAC HIGH FLOW (TUBING) ×1 IMPLANT
STAPLER HERNIA 12 8.5 360D (INSTRUMENTS) IMPLANT
SUT MNCRL AB 4-0 PS2 18 (SUTURE) ×1 IMPLANT
SUT VIC AB 1 CT1 27XBRD ANBCTR (SUTURE) IMPLANT
SUT VICRYL 0 UR6 27IN ABS (SUTURE) IMPLANT
TOWEL GREEN STERILE (TOWEL DISPOSABLE) ×1 IMPLANT
TOWEL GREEN STERILE FF (TOWEL DISPOSABLE) ×1 IMPLANT
TRAY LAPAROSCOPIC MC (CUSTOM PROCEDURE TRAY) ×1 IMPLANT
TROCAR OPTICAL SHORT 5MM (TROCAR) ×1 IMPLANT
TROCAR OPTICAL SLV SHORT 5MM (TROCAR) ×1 IMPLANT
TROCAR Z THREAD OPTICAL 12X100 (TROCAR) ×1 IMPLANT
WARMER LAPAROSCOPE (MISCELLANEOUS) ×1 IMPLANT
WATER STERILE IRR 1000ML POUR (IV SOLUTION) ×1 IMPLANT

## 2023-11-08 NOTE — Anesthesia Postprocedure Evaluation (Signed)
 Anesthesia Post Note  Patient: NAIN RUDD  Procedure(s) Performed: REPAIR, HERNIA, INGUINAL, LAPAROSCOPIC (Right: Groin)     Patient location during evaluation: PACU Anesthesia Type: General Level of consciousness: awake and alert Pain management: pain level controlled Vital Signs Assessment: post-procedure vital signs reviewed and stable Respiratory status: spontaneous breathing, nonlabored ventilation, respiratory function stable and patient connected to nasal cannula oxygen Cardiovascular status: blood pressure returned to baseline and stable Postop Assessment: no apparent nausea or vomiting Anesthetic complications: no   There were no known notable events for this encounter.  Last Vitals:  Vitals:   11/08/23 1330 11/08/23 1345  BP: (!) 149/85 (!) 144/78  Pulse: (!) 53 (!) 52  Resp: 16 10  Temp:  (!) 36.3 C  SpO2: 98% 98%    Last Pain:  Vitals:   11/08/23 1330  TempSrc:   PainSc: 3                  Babatunde Seago

## 2023-11-08 NOTE — Discharge Instructions (Signed)

## 2023-11-08 NOTE — Anesthesia Procedure Notes (Signed)
 Procedure Name: Intubation Date/Time: 11/08/2023 12:27 PM  Performed by: Scherrie Mast, CRNAPre-anesthesia Checklist: Patient identified, Emergency Drugs available, Suction available and Patient being monitored Patient Re-evaluated:Patient Re-evaluated prior to induction Oxygen Delivery Method: Circle System Utilized Preoxygenation: Pre-oxygenation with 100% oxygen Induction Type: IV induction Ventilation: Mask ventilation without difficulty Laryngoscope Size: Miller and 2 Grade View: Grade I Tube type: Oral Tube size: 7.5 mm Number of attempts: 1 Airway Equipment and Method: Stylet and Oral airway Placement Confirmation: ETT inserted through vocal cords under direct vision, positive ETCO2 and breath sounds checked- equal and bilateral Tube secured with: Tape Dental Injury: Teeth and Oropharynx as per pre-operative assessment

## 2023-11-08 NOTE — H&P (Signed)
 Chief Complaint: New Consultation (hernia)       History of Present Illness: Shawn [name] is a 78 y.o. male who is seen today as an office consultation at the request of Dr. McGowen for evaluation of New Consultation (hernia) .   History of Present Illness Shawn [name] is a 78 year old male with an inguinal hernia who presents with pressure and discomfort in the groin area. He was referred by Dr. Helon for evaluation of his inguinal hernia.   An inguinal hernia was initially identified during an MRI for hip replacement. The hernia became more apparent following a 30-pound weight loss after a pulmonary embolism three years ago. Initially asymptomatic, he now experiences pressure in the groin, resembling fluid collection.   He manages anticoagulation with Eliquis , stopping it three days prior to procedures. No generalized abdominal pain, nausea, or vomiting.       Review of Systems: A complete review of systems was obtained from the patient.  I have reviewed this information and discussed as appropriate with the patient.  See HPI as well for other ROS.   Review of Systems  Constitutional:  Negative for fever.  HENT:  Negative for congestion.   Eyes:  Negative for blurred vision.  Respiratory:  Negative for cough, shortness of breath and wheezing.   Cardiovascular:  Negative for chest pain and palpitations.  Gastrointestinal:  Negative for heartburn.  Genitourinary:  Negative for dysuria.  Musculoskeletal:  Negative for myalgias.  Skin:  Negative for rash.  Neurological:  Negative for dizziness and headaches.  Psychiatric/Behavioral:  Negative for depression and suicidal ideas.   All other systems reviewed and are negative.       Medical History: Past Medical History Past Medical History: DiagnosisDate            GERD (gastroesophageal reflux disease)                   Hyperlipidemia                         Hypertension                Pulmonary embolism (CMS/HHS-HCC)                       Thyroid  disease                    Problem List There is no problem list on file for this patient.     Past Surgical History Past Surgical History: ProcedureLateralityDate            APPENDECTOMY                               CATARACT EXTRACTION                             JOINT REPLACEMENT                             Allergies No Known Allergies     Medications Ordered Prior to Encounter Current Outpatient Medications on File Prior to Visit MedicationSigDispenseRefill            apixaban  (ELIQUIS ) 2.5 mg tablet     Take 2.5 mg by mouth 2 (two) times daily  atorvastatin  (LIPITOR) 10 MG tablet  Take 10 mg by mouth once daily                                benazepriL  (LOTENSIN ) 20 MG tablet          Take 20 mg by mouth once daily                                hydroCHLOROthiazide  (HYDRODIURIL ) 25 MG tablet        TAKE ONE-HALF (1/2) TABLET DAILY (APPOINTMENT DUE IN OCTOBER)                                    omeprazole (PRILOSEC) 40 MG DR capsule                                         No current facility-administered medications on file prior to visit.       Family History Family History ProblemRelationAge of Onset            Coronary Artery Disease (Blocked arteries around heart)     Mother              High blood pressure (Hypertension)   Mother              Hyperlipidemia (Elevated cholesterol)            Mother              Coronary Artery Disease (Blocked arteries around heart)     Father          Tobacco Use History Social History     Tobacco Use Smoking StatusNever Smokeless TobaccoNever       Social History Social History     Socioeconomic History Marital status:Married Tobacco Use Smoking status:Never Smokeless tobacco:Never Vaping Use Vaping status:Never Used Substance and Sexual Activity Drug ldz:Wzczm     Social Drivers of Health     Financial Resource Strain: Patient Declined  (08/27/2023)             Received from [facility]             Overall Financial Resource Strain (CARDIA)                        Difficulty of Paying Living Expenses: Patient declined Food Insecurity: Patient Declined (08/27/2023)             Received from [facility]             Hunger Vital Sign                        Within the past 12 months, you worried that your food would run out before you got the money to buy more.: Patient declined                        Within the past 12 months, the food you bought just didn't last and you didn't have money to get more.: Patient declined Transportation Needs: Patient Declined (08/27/2023)  Received from [facility]             PRAPARE - Transportation                        Lack of Transportation (Medical): Patient declined                        Lack of Transportation (Non-Medical): Patient declined Physical Activity: Sufficiently Active (08/22/2023)             Received from [facility]             Exercise Vital Sign                        On average, how many days per week do you engage in moderate to strenuous exercise (like a brisk walk)?: 7 days                        On average, how many minutes do you engage in exercise at this level?: 60 min Stress: No Stress Concern Present (08/22/2023)             Received from [facility] - Occupational Stress Questionnaire                        Feeling of Stress : Not at all Social Connections: Socially Integrated (08/22/2023)             Received from [facility]             Social Connection and Isolation Panel                        In a typical week, how many times do you talk on the phone with family, friends, or neighbors?: More than three times a week                        How often do you get together with friends or relatives?: More than three times a week                        How often do you attend church or religious  services?: More than 4 times per year                        Do you belong to any clubs or organizations such as church groups, unions, fraternal or athletic groups, or school groups?: Yes                        How often do you attend meetings of the clubs or organizations you belong to?: More than 4 times per year                        Are you married, widowed, divorced, separated, never married, or living with a partner?: Married Housing Stability: Patient Declined (08/27/2023)             Received from [facility] Stability Vital Sign  In the last 12 months, was there a time when you were not able to pay the mortgage or rent on time?: Patient declined                        In the past 12 months, how many times have you moved where you were living?: 1                        At any time in the past 12 months, were you homeless or living in a shelter (including now)?: Patient declined       Objective:    BP (!) 158/86   Pulse 65   Temp 98.3 F (36.8 C) (Oral)   Resp 17   Ht 5' 10 (1.778 m)   Wt 91.6 kg   SpO2 96%   BMI 28.98 kg/m     Body mass index is 29.56 kg/m. Physical Exam Constitutional:      Appearance: Normal appearance.  HENT:     Head: Normocephalic and atraumatic.     Nose: Nose normal. No congestion.     Mouth/Throat:     Mouth: Mucous membranes are moist.     Pharynx: Oropharynx is clear.    Eyes:     Pupils: Pupils are equal, round, and reactive to light.      Cardiovascular:     Rate and Rhythm: Normal rate and regular rhythm.     Pulses: Normal pulses.     Heart sounds: Normal heart sounds. No murmur heard.    No friction rub. No gallop.  Pulmonary:     Effort: Pulmonary effort is normal. No respiratory distress.     Breath sounds: Normal breath sounds. No stridor. No wheezing, rhonchi or rales.  Abdominal:     General: Abdomen is flat.     Hernia: A hernia is present. Hernia is present in the left  inguinal area and right inguinal area.    Musculoskeletal:        General: Normal range of motion.     Cervical back: Normal range of motion.    Skin:    General: Skin is warm and dry.    Neurological:     General: No focal deficit present.     Mental Status: He is alert and oriented to person, place, and time.    Psychiatric:        Mood and Affect: Mood normal.        Thought Content: Thought content normal.          Assessment and Plan: Diagnoses and all orders for this visit:   Unilateral inguinal hernia without obstruction or gangrene, recurrence not specified     Shawn [name] is a 78 y.o. male    1.          We will proceed to the OR for a lap right inguinal hernia repair with mesh. 2.         All risks and benefits were discussed with the patient, to generally include infection, bleeding, damage to surrounding structures, acute and chronic nerve pain, and recurrence. Alternatives were offered and described.  All questions were answered and the patient voiced understanding of the procedure and wishes to proceed at this point.             No follow-ups on file.   Lynda Leos, MD, [facility], [place] [facility]

## 2023-11-08 NOTE — Op Note (Signed)
 11/08/2023  12:55 PM  PATIENT:  Shawn [name]  78 y.o. male  PRE-OPERATIVE DIAGNOSIS:  RIGHT INGUINAL HERNIA  POST-OPERATIVE DIAGNOSIS:  RIGHT DIRECT INGUINAL HERNIA  PROCEDURE:  Procedure(s): REPAIR, HERNIA, INGUINAL, LAPAROSCOPIC (Right)  SURGEON:  Surgeons and Role:    DEWAINE Rubin Calamity, MD - Primary  ASSISTANTS: Powell Seats, RNFA   ANESTHESIA:   local and general  EBL:  minimal   BLOOD ADMINISTERED:none  DRAINS: none   LOCAL MEDICATIONS USED:  BUPIVICAINE   SPECIMEN:  No Specimen  DISPOSITION OF SPECIMEN:  N/A  COUNTS:  YES  TOURNIQUET:  * No tourniquets in log *  DICTATION: .Dragon Dictation Counts: reported as correct x 2  Findings:  The patient had a large right direct hernia   Indications for procedure:  The patient is a 78 year old male with a right hernia for several months. Patient complained of symptomatology to his right inguinal area. The patient was taken back for elective inguinal hernia repair.  Details of the procedure: The patient was taken back to the operating room. The patient was placed in supine position with bilateral SCDs in place.  The patient was prepped and draped in the usual sterile fashion.  After appropriate anitbiotics were confirmed, a time-out was confirmed and all facts were verified.  0.25% Marcaine  was used to infiltrate the umbilical area. A 11-blade was used to cut down the skin and blunt dissection was used to get the anterior fashion.  The anterior fascia was incised approximately 1 cm and the muscles were retracted laterally. Blunt dissection was then used to create a space in the preperitoneal area. At this time a 10 mm camera was then introduced into the space and advanced the pubic tubercle and a 12 mm trocar was placed over this and insufflation was started.  At this time and space was created from medial to laterally the preperitoneal space.  Cooper's ligament was initially cleaned off.  The hernia was identified in the  direct space. Dissection of the hernia sac was undertaken and it was fully reduced.  The transversalis fascia retracted spontaneously.    The spermatic cord and the vas deferens was identified and protected in all parts of the case. Cremasterics were dissected off from the cord.    The peritoneum was taken down to approximately the umbilicus a Bard 3D Max mesh, size: Nickola, was  introduced into the preperitoneal space.  The mesh was brought over to cover the direct and indirect hernia spaces.  This was anchored into place and secured to Cooper's ligament with 4.34mm staples from a [place] hernia stapler. It was anchored to the anterior abdominal wall with 4.8 mm staples. The peritoneum was seen lying posterior to the mesh. There was no staples placed laterally. The insufflation was evacuated and the peritoneum was seen posterior to the mesh. The trochars were removed. The anterior fascia was reapproximated using #1 Vicryl on a UR- 6.  Intra-abdominal air was evacuated and the Veress needle removed. The skin was reapproximated using 4-0 Monocryl subcuticular fashion.  The skin was dressed with Dermabond.  The patient was awakened from general anesthesia and taken to recovery in stable condition.   PLAN OF CARE: Discharge to home after PACU  PATIENT DISPOSITION:  PACU - hemodynamically stable.   Delay start of Pharmacological VTE agent (>24hrs) due to surgical blood loss or risk of bleeding: not applicable

## 2023-11-08 NOTE — Anesthesia Preprocedure Evaluation (Signed)
 Anesthesia Evaluation  Patient identified by MRN, date of birth, ID band Patient awake    Reviewed: Allergy & Precautions, NPO status , Patient's Chart, lab work & pertinent test results  History of Anesthesia Complications Negative for: history of anesthetic complications  Airway Mallampati: III  TM Distance: >3 FB Neck ROM: Full    Dental  (+) Dental Advisory Given   Pulmonary neg shortness of breath, neg sleep apnea, neg COPD, neg recent URI, PE (09/2020)   Pulmonary exam normal breath sounds clear to auscultation       Cardiovascular hypertension, Pt. on medications (-) angina (-) Past MI, (-) Cardiac Stents and (-) CABG (-) dysrhythmias  Rhythm:Regular Rate:Normal  HLD  TTE 09/19/2020: IMPRESSIONS     1. Left ventricular ejection fraction, by estimation, is 70 to 75%. The  left ventricle has hyperdynamic function. The left ventricle has no  regional wall motion abnormalities. Left ventricular diastolic parameters  were normal.   2. Right ventricular systolic function is mildly reduced. The right  ventricular size is normal. There is normal pulmonary artery systolic  pressure.   3. Trivial mitral valve regurgitation.   4. The aortic valve is tricuspid. Aortic valve regurgitation is not  visualized. Mild aortic valve sclerosis is present, with no evidence of  aortic valve stenosis.   5. The inferior vena cava is normal in size with greater than 50%  respiratory variability, suggesting right atrial pressure of 3 mmHg.     Neuro/Psych neg Seizures  Neuromuscular disease (mild lumbar spondylosis)    GI/Hepatic Neg liver ROS,GERD (Barrett's esophagus)  Medicated,,  Endo/Other  neg diabetesHypothyroidism    Renal/GU CRFRenal disease     Musculoskeletal  (+) Arthritis , Osteoarthritis,    Abdominal   Peds  Hematology negative hematology ROS (+)   Anesthesia Other Findings   Reproductive/Obstetrics                               Anesthesia Physical Anesthesia Plan  ASA: 3  Anesthesia Plan: General   Post-op Pain Management: Tylenol  PO (pre-op)* and Celebrex PO (pre-op)*   Induction: Intravenous  PONV Risk Score and Plan: 1 and Dexamethasone , Ondansetron  and Treatment may vary due to age or medical condition  Airway Management Planned: Oral ETT  Additional Equipment: None  Intra-op Plan:   Post-operative Plan: Extubation in OR  Informed Consent: I have reviewed the patients History and Physical, chart, labs and discussed the procedure including the risks, benefits and alternatives for the proposed anesthesia with the patient or authorized representative who has indicated his/her understanding and acceptance.     Dental advisory given  Plan Discussed with: CRNA and Anesthesiologist  Anesthesia Plan Comments:         Anesthesia Quick Evaluation

## 2023-11-08 NOTE — Transfer of Care (Signed)
 Immediate Anesthesia Transfer of Care Note  Patient: Shawn [name]  Procedure(s) Performed: REPAIR, HERNIA, INGUINAL, LAPAROSCOPIC (Right: Groin)  Patient Location: PACU  Anesthesia Type:General  Level of Consciousness: awake, alert , and oriented  Airway & Oxygen Therapy: Patient Spontanous Breathing and Patient connected to nasal cannula oxygen  Post-op Assessment: Report given to RN and Post -op Vital signs reviewed and stable  Post vital signs: Reviewed and stable  Last Vitals:  Vitals Value Taken Time  BP 129/73 11/08/23 13:10  Temp 97.7 1310  Pulse 58 11/08/23 13:12  Resp 11 11/08/23 13:12  SpO2 87 % 11/08/23 13:12  Vitals shown include unfiled device data.  Last Pain:  Vitals:   11/08/23 1118  TempSrc:   PainSc: 0-No pain         Complications: No notable events documented.

## 2023-11-09 ENCOUNTER — Encounter (HOSPITAL_COMMUNITY): Payer: Self-pay | Admitting: General Surgery

## 2023-12-13 ENCOUNTER — Other Ambulatory Visit: Payer: Self-pay | Admitting: Family Medicine

## 2023-12-14 ENCOUNTER — Other Ambulatory Visit: Payer: Self-pay

## 2023-12-14 MED ORDER — BENAZEPRIL HCL 20 MG PO TABS: 20.0000 mg | ORAL_TABLET | Freq: Every day | ORAL | 0 refills | Status: DC

## 2024-01-05 DIAGNOSIS — K449 Diaphragmatic hernia without obstruction or gangrene: Secondary | ICD-10-CM | POA: Diagnosis not present

## 2024-01-05 DIAGNOSIS — K317 Polyp of stomach and duodenum: Secondary | ICD-10-CM | POA: Diagnosis not present

## 2024-01-05 DIAGNOSIS — K2270 Barrett's esophagus without dysplasia: Secondary | ICD-10-CM | POA: Diagnosis not present

## 2024-01-05 DIAGNOSIS — K219 Gastro-esophageal reflux disease without esophagitis: Secondary | ICD-10-CM | POA: Diagnosis not present

## 2024-01-07 ENCOUNTER — Encounter: Payer: Self-pay | Admitting: Family Medicine

## 2024-01-10 ENCOUNTER — Other Ambulatory Visit: Payer: Self-pay | Admitting: Family Medicine

## 2024-01-12 DIAGNOSIS — Z23 Encounter for immunization: Secondary | ICD-10-CM | POA: Diagnosis not present

## 2024-01-15 ENCOUNTER — Encounter: Payer: Self-pay | Admitting: Family Medicine

## 2024-02-03 ENCOUNTER — Other Ambulatory Visit: Payer: Self-pay

## 2024-02-03 MED ORDER — BENAZEPRIL HCL 20 MG PO TABS: 20.0000 mg | ORAL_TABLET | Freq: Every day | ORAL | 0 refills | Status: DC

## 2024-02-14 ENCOUNTER — Other Ambulatory Visit: Payer: Self-pay

## 2024-02-23 ENCOUNTER — Other Ambulatory Visit: Payer: Self-pay | Admitting: Family Medicine

## 2024-03-15 ENCOUNTER — Encounter: Payer: Self-pay | Admitting: Family Medicine

## 2024-03-17 ENCOUNTER — Encounter: Payer: Self-pay | Admitting: Family Medicine

## 2024-03-17 ENCOUNTER — Ambulatory Visit: Admitting: Family Medicine

## 2024-03-17 VITALS — BP 118/68 | HR 71 | Temp 97.2°F | Ht 70.5 in | Wt 210.2 lb

## 2024-03-17 DIAGNOSIS — E7800 Pure hypercholesterolemia, unspecified: Secondary | ICD-10-CM | POA: Diagnosis not present

## 2024-03-17 DIAGNOSIS — I10 Essential (primary) hypertension: Secondary | ICD-10-CM

## 2024-03-17 DIAGNOSIS — Z0000 Encounter for general adult medical examination without abnormal findings: Secondary | ICD-10-CM

## 2024-03-17 LAB — CBC WITH DIFFERENTIAL/PLATELET
Basophils Absolute: 0 K/uL (ref 0.0–0.1)
Basophils Relative: 0.4 % (ref 0.0–3.0)
Eosinophils Absolute: 0.1 K/uL (ref 0.0–0.7)
Eosinophils Relative: 0.9 % (ref 0.0–5.0)
HCT: 42.9 % (ref 39.0–52.0)
Hemoglobin: 14.3 g/dL (ref 13.0–17.0)
Lymphocytes Relative: 25.2 % (ref 12.0–46.0)
Lymphs Abs: 1.9 K/uL (ref 0.7–4.0)
MCHC: 33.4 g/dL (ref 30.0–36.0)
MCV: 81.9 fl (ref 78.0–100.0)
Monocytes Absolute: 0.9 K/uL (ref 0.1–1.0)
Monocytes Relative: 11.1 % (ref 3.0–12.0)
Neutro Abs: 4.8 K/uL (ref 1.4–7.7)
Neutrophils Relative %: 62.4 % (ref 43.0–77.0)
Platelets: 189 K/uL (ref 150.0–400.0)
RBC: 5.23 Mil/uL (ref 4.22–5.81)
RDW: 15.5 % (ref 11.5–15.5)
WBC: 7.7 K/uL (ref 4.0–10.5)

## 2024-03-17 LAB — LIPID PANEL
Cholesterol: 175 mg/dL (ref 0–200)
HDL: 45.5 mg/dL (ref >39.00)
LDL Cholesterol: 104 mg/dL — ABNORMAL HIGH (ref 0–99)
NonHDL: 129.74
Total CHOL/HDL Ratio: 4
Triglycerides: 128 mg/dL (ref 0.0–149.0)
VLDL: 25.6 mg/dL (ref 0.0–40.0)

## 2024-03-17 LAB — COMPREHENSIVE METABOLIC PANEL WITH GFR
ALT: 17 U/L (ref 0–53)
AST: 26 U/L (ref 0–37)
Albumin: 4.7 g/dL (ref 3.5–5.2)
Alkaline Phosphatase: 89 U/L (ref 39–117)
BUN: 18 mg/dL (ref 6–23)
CO2: 27 meq/L (ref 19–32)
Calcium: 9.7 mg/dL (ref 8.4–10.5)
Chloride: 98 meq/L (ref 96–112)
Creatinine, Ser: 0.91 mg/dL (ref 0.40–1.50)
GFR: 81.67 mL/min (ref >60.00)
Glucose, Bld: 82 mg/dL (ref 70–99)
Potassium: 4.1 meq/L (ref 3.5–5.1)
Sodium: 133 meq/L — ABNORMAL LOW (ref 135–145)
Total Bilirubin: 0.6 mg/dL (ref 0.2–1.2)
Total Protein: 7.4 g/dL (ref 6.0–8.3)

## 2024-03-17 MED ORDER — HYDROCHLOROTHIAZIDE 25 MG PO TABS: 12.5000 mg | ORAL_TABLET | Freq: Every day | ORAL | 3 refills | Status: AC

## 2024-03-17 MED ORDER — ATORVASTATIN CALCIUM 10 MG PO TABS: 10.0000 mg | ORAL_TABLET | Freq: Every day | ORAL | 3 refills | Status: AC

## 2024-03-17 MED ORDER — BENAZEPRIL HCL 20 MG PO TABS: 20.0000 mg | ORAL_TABLET | Freq: Every day | ORAL | 3 refills | Status: AC

## 2024-03-17 NOTE — Progress Notes (Signed)
 Office Note 03/17/2024  CC:  Chief Complaint  Patient presents with   Medical Management of Chronic Issues    HPI:  Patient is a Shawn [name] who is here for annual health maintenance exam and f/u HTN and HLD. Feeling well lately.  His main issue is chronic right hip pain.  He uses a rolling walker at times but mostly uses a cane. No recent falls.    Past Medical History:  Diagnosis Date   Acute urinary retention 12/2018   ?BPH   Asymmetrical sensorineural hearing loss 2021   Likely from noise exposure.  [facility] [phone] ENT referral for this dx being relatively new.   Barrett's esophagus    Dr. Kristie   Cataract    fixed early this spring   Chronic renal insufficiency, stage 2 (mild) 2017   Stage II/III (GFR 60 ml/min)   GERD (gastroesophageal reflux disease)    Hepatic steatosis    on CT abd 2022   History of adenomatous polyp of colon 09/2007   Polyps at 1st screening colonoscopy; 2014 no polyps.  Rpt 12/29/17: adenomatous polyp-->  Recall 5 yrs.   Hyperlipemia, mixed    Pt intol of welchol; fenofibrate  helpful.  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  Unprovoked->hosp ref to hem/onc.  Pulm recommended anticoag indefinitely.   Subclinical hypothyroidism    per old PCP records.  TPO ab's elevated mildly 12/2017, with TSH 6.6 and normal T4 and T3==>developing Hashimoto's.    Thrombocytopenia    Hx of, mild/stable with monitoring per old PCP records    Past Surgical History:  Procedure Laterality Date   APPENDECTOMY  1971   Burst   CATARACT EXTRACTION Bilateral 04/13/2018   05/14/2018   COLONOSCOPY  12/29/2017   2019 adenoma,  12/2022 polyp x 1   COLONOSCOPY W/ POLYPECTOMY  2014   per Dr. Kristie, recall 02/2023    ESOPHAGOGASTRODUODENOSCOPY  2014; 2016; 11/16/16   2018--Barrett's esoph reconfirmed   INGUINAL HERNIA REPAIR Right 11/08/2023   Procedure: REPAIR, HERNIA, INGUINAL, LAPAROSCOPIC;  Surgeon: Rubin Calamity, MD;  Location: [facility];  Service: General;  Laterality: Right;   TOTAL HIP ARTHROPLASTY Left 09/22/2022   Procedure: TOTAL HIP ARTHROPLASTY;  Surgeon: Josefina Chew, MD;  Location: [facility];  Service: Orthopedics;  Laterality: Left;   TRANSTHORACIC ECHOCARDIOGRAM  09/19/2020   (in the setting of acute PE)->EF 70-75%, RV fxn mildly reduced, hyperdynamic LV fxn, normal PA pressure, no valve probs.   TUBAL LIGATION  1980   UPPER GI ENDOSCOPY  03/03/2019   2020.  03/03/21->small patch Barretts, multiple sessile gastric polyps, normal duodenum.  12/2023 NORMAL (no repeat recommended)    Family History  Problem Relation Age of Onset   Hypertension Mother    Hyperlipidemia Mother    Arthritis Father    Diabetes Paternal Grandmother 63    Social History   Socioeconomic History   Marital status: Married    Spouse name: Not on file   Number of children: Not on file   Years of education: Not on file   Highest education level: Master's degree (e.g., MA, MS, MEng, MEd, MSW, MBA)  Occupational History   Not on file  Tobacco Use   Smoking status:  Never   Smokeless tobacco: Never  Vaping Use   Vaping status: Never Used  Substance and Sexual Activity   Alcohol  use: No   Drug use: No   Sexual activity: Not on file  Other Topics Concern   Not on file  Social History Narrative   Married, grand-daughter lives with him.   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   Orig from [place]  and [place].   No T/A/Ds.   Exercises 3 X/week--walking 2-3 miles.   Social Drivers of Health   Financial Resource Strain: Patient Declined (08/27/2023)   Received from [facility]   Overall Financial Resource Strain (CARDIA)    Difficulty of Paying Living Expenses: Patient  declined  Food Insecurity: Patient Declined (08/27/2023)   Received from [facility]   Hunger Vital Sign    Within the past 12 months, you worried that your food would run out before you got the money to buy more.: Patient declined    Within the past 12 months, the food you bought just didn't last and you didn't have money to get more.: Patient declined  Transportation Needs: Patient Declined (08/27/2023)   Received from [facility] - Transportation    Lack of Transportation (Medical): Patient declined    Lack of Transportation (Non-Medical): Patient declined  Physical Activity: Sufficiently Active (08/22/2023)   Exercise Vital Sign    Days of Exercise per Week: 7 days    Minutes of Exercise per Session: 60 min  Stress: No Stress Concern Present (08/22/2023)   [employer] of Occupational [employer] - Occupational Stress Questionnaire    Feeling of Stress : Not at all  Social Connections: Socially Integrated (08/22/2023)   Social Connection and Isolation Panel    Frequency of Communication with Friends and Family: More than three times a week    Frequency of Social Gatherings with Friends and Family: More than three times a week    Attends Religious Services: More than 4 times per year    Active Member of [employer] or Organizations: Yes    Attends Banker Meetings: More than 4 times per year    Marital Status: Married  Catering Manager Violence: Not At Risk (09/22/2022)   Humiliation, Afraid, Rape, and Kick questionnaire    Fear of Current or Ex-Partner: No    Emotionally Abused: No    Physically Abused: No    Sexually Abused: No    Outpatient Medications Prior to Visit  Medication Sig Dispense Refill   acetaminophen  (TYLENOL ) 500 MG tablet Take 1,000 mg by mouth every 6 (six) hours as needed for mild pain (pain score 1-3) or moderate pain (pain score 4-6).     apixaban  (ELIQUIS ) 2.5 MG TABS tablet Take 1 tablet (2.5 mg total) by mouth 2 (two) times daily. 180  tablet 3   EPINEPHrine  0.3 mg/0.3 mL IJ SOAJ injection Inject 0.3 mg into the muscle as needed for anaphylaxis. 2 each 1   Nutritional Supplements (FRUIT & VEGETABLE DAILY) CAPS Take 3 capsules by mouth daily.     omeprazole (PRILOSEC) 40 MG capsule Take 40 mg by mouth daily.     Polyethyl Glycol-Propyl Glycol (SYSTANE OP) Place 1 drop into both eyes daily as needed (dry eyes).     Probiotic Product (PROBIOTIC PO) Take 1 capsule by mouth daily. Ultraflora     atorvastatin  (LIPITOR) 10 MG tablet Take 1 tablet (10 mg total) by mouth daily. 90 tablet 3   benazepril  (LOTENSIN ) 20  MG tablet Take 1 tablet (20 mg total) by mouth daily. OFFICE VISIT NEEDED FOR FURTHER REFILLS 15 tablet 0   hydrochlorothiazide  (HYDRODIURIL ) 25 MG tablet TAKE ONE-HALF (1/2) TABLET DAILY (APPOINTMENT DUE IN OCTOBER) 45 tablet 0   traMADol  (ULTRAM ) 50 MG tablet Take 1 tablet (50 mg total) by mouth every 6 (six) hours as needed. (Patient not taking: Reported on 03/17/2024) 20 tablet 0   No facility-administered medications prior to visit.    No Known Allergies  Review of Systems  Constitutional:  Negative for appetite change, chills, fatigue and fever.  HENT:  Negative for congestion, dental problem, ear pain and sore throat.   Eyes:  Negative for discharge, redness and visual disturbance.  Respiratory:  Negative for cough, chest tightness, shortness of breath and wheezing.   Cardiovascular:  Negative for chest pain, palpitations and leg swelling.  Gastrointestinal:  Negative for abdominal pain, blood in stool, diarrhea, nausea and vomiting.  Genitourinary:  Negative for difficulty urinating, dysuria, flank pain, frequency, hematuria and urgency.  Musculoskeletal:  Positive for arthralgias (right hip and left knee). Negative for back pain, joint swelling, myalgias and neck stiffness.  Skin:  Negative for pallor and rash.  Neurological:  Negative for dizziness, speech difficulty, weakness and headaches.  Hematological:   Negative for adenopathy. Does not bruise/bleed easily.  Psychiatric/Behavioral:  Negative for confusion and sleep disturbance. The patient is not nervous/anxious.    PE;    03/17/2024    1:04 PM 11/08/2023    1:45 PM 11/08/2023    1:30 PM  Vitals with BMI  Height 5' 10.5    Weight 210 lbs 3 oz    BMI 29.72    Systolic 118 144 850  Diastolic 68 78 85  Pulse 71 52 53     Gen: Alert, well appearing.  Patient is oriented to person, place, time, and situation. AFFECT: pleasant, lucid thought and speech. ENT: Ears: EACs clear, normal epithelium.  TMs with good light reflex and landmarks bilaterally.  Eyes: no injection, icteris, swelling, or exudate.  EOMI, PERRLA. Nose: no drainage or turbinate edema/swelling.  No injection or focal lesion.  Mouth: lips without lesion/swelling.  Oral mucosa pink and moist.  Dentition intact and without obvious caries or gingival swelling.  Oropharynx without erythema, exudate, or swelling.  Neck: supple/nontender.  No LAD, mass, or TM.  Carotid pulses 2+ bilaterally, without bruits. CV: RRR, no m/r/g.   LUNGS: CTA bilat, nonlabored resps, good aeration in all lung fields. ABD: soft, NT, ND EXT: no clubbing, cyanosis, or edema.  Musculoskeletal: no joint swelling, erythema, warmth, or tenderness.  ROM of all joints intact. Skin - no sores or suspicious lesions or rashes or color changes  Pertinent labs:  Lab Results  Component Value Date   TSH 5.68 (H) 07/13/2022   Lab Results  Component Value Date   WBC 6.9 11/01/2023   HGB 14.3 11/01/2023   HCT 43.3 11/01/2023   MCV 83.8 11/01/2023   PLT 175 11/01/2023   Lab Results  Component Value Date   CREATININE 0.89 11/01/2023   BUN 11 11/01/2023   NA 135 11/01/2023   K 4.2 11/01/2023   CL 100 11/01/2023   CO2 26 11/01/2023   Lab Results  Component Value Date   ALT 19 07/07/2023   AST 30 07/07/2023   ALKPHOS 89 07/07/2023   BILITOT 0.6 07/07/2023   Lab Results  Component Value Date   CHOL  170 12/30/2022   Lab Results  Component Value Date  HDL 47.60 12/30/2022   Lab Results  Component Value Date   LDLCALC 98 12/30/2022   Lab Results  Component Value Date   TRIG 121.0 12/30/2022   Lab Results  Component Value Date   CHOLHDL 4 12/30/2022   Lab Results  Component Value Date   PSA 2.48 12/07/2019   PSA 1.84 08/19/2018   PSA 1.86 07/08/2017    ASSESSMENT AND PLAN:   #1 health maintenance exam: Reviewed age and gender appropriate health maintenance issues (prudent diet, regular exercise, health risks of tobacco and excessive alcohol , use of seatbelts, fire alarms in home, use of sunscreen).  Also reviewed age and gender appropriate health screening as well as vaccine recommendations. LABS: [facility] ordered Vaccines: He declines shingrix.  Otherwise all vaccines UTD. Colon ca screening: 12/2022 polyp x 1. Prostate ca screening: he declines any further PSA screening  #2 hypertension, well-controlled on benazepril  20 mg a day and HCTZ 12.5 mg a day.  Monitor electrolytes and creatinine today.  3.  Hypercholesterolemia, doing well on atorvastatin  10 mg a day. Lipid panel today.  An After Visit Summary was printed and given to the patient.  FOLLOW UP:  Return in about 6 months (around 09/15/2024) for routine chronic illness f/u.  Signed:  Gerlene Hockey, MD           03/17/2024

## 2024-03-19 ENCOUNTER — Ambulatory Visit: Payer: Self-pay | Admitting: Family Medicine

## 2024-07-06 ENCOUNTER — Inpatient Hospital Stay: Admitting: Hematology and Oncology

## 2024-07-06 ENCOUNTER — Inpatient Hospital Stay

## 2024-09-15 ENCOUNTER — Ambulatory Visit: Admitting: Family Medicine
